# Patient Record
Sex: Female | Born: 1965
Health system: Southern US, Community
[De-identification: ages and names within clinical notes are randomized; demographics above are authoritative.]

## PROBLEM LIST (undated history)

## (undated) DIAGNOSIS — D649 Anemia, unspecified: Secondary | ICD-10-CM

## (undated) DIAGNOSIS — F32A Depression, unspecified: Secondary | ICD-10-CM

## (undated) DIAGNOSIS — J449 Chronic obstructive pulmonary disease, unspecified: Secondary | ICD-10-CM

## (undated) DIAGNOSIS — F329 Major depressive disorder, single episode, unspecified: Secondary | ICD-10-CM

## (undated) DIAGNOSIS — N809 Endometriosis, unspecified: Secondary | ICD-10-CM

## (undated) DIAGNOSIS — K219 Gastro-esophageal reflux disease without esophagitis: Secondary | ICD-10-CM

## (undated) DIAGNOSIS — B3781 Candidal esophagitis: Secondary | ICD-10-CM

## (undated) DIAGNOSIS — J180 Bronchopneumonia, unspecified organism: Secondary | ICD-10-CM

## (undated) DIAGNOSIS — S46811A Strain of other muscles, fascia and tendons at shoulder and upper arm level, right arm, initial encounter: Secondary | ICD-10-CM

## (undated) HISTORY — PX: FRACTURE SURGERY: SHX138

## (undated) HISTORY — DX: Anemia, unspecified: D64.9

## (undated) HISTORY — DX: Candidal esophagitis: B37.81

## (undated) HISTORY — DX: Strain of other muscles, fascia and tendons at shoulder and upper arm level, right arm, initial encounter: S46.811A

---

## 1984-04-07 HISTORY — PX: TUBAL LIGATION: SHX77

## 1989-04-07 HISTORY — PX: ABDOMINAL HYSTERECTOMY: SHX81

## 1998-05-30 ENCOUNTER — Ambulatory Visit (HOSPITAL_COMMUNITY): Admission: RE | Admit: 1998-05-30 | Discharge: 1998-05-30 | Payer: Self-pay | Admitting: *Deleted

## 1998-05-30 ENCOUNTER — Encounter: Payer: Self-pay | Admitting: *Deleted

## 2000-01-01 ENCOUNTER — Inpatient Hospital Stay (HOSPITAL_COMMUNITY): Admission: AD | Admit: 2000-01-01 | Discharge: 2000-01-01 | Payer: Self-pay | Admitting: Obstetrics

## 2006-04-07 HISTORY — PX: FOOT FRACTURE SURGERY: SHX645

## 2011-03-22 ENCOUNTER — Emergency Department (HOSPITAL_COMMUNITY)
Admission: EM | Admit: 2011-03-22 | Discharge: 2011-03-23 | Disposition: A | Discharge status: Home / Self Care | Payer: Self-pay | Attending: Emergency Medicine | Admitting: Emergency Medicine

## 2011-03-22 ENCOUNTER — Encounter: Payer: Self-pay | Admitting: Emergency Medicine

## 2011-03-22 DIAGNOSIS — S0100XA Unspecified open wound of scalp, initial encounter: Secondary | ICD-10-CM | POA: Insufficient documentation

## 2011-03-22 DIAGNOSIS — W1809XA Striking against other object with subsequent fall, initial encounter: Secondary | ICD-10-CM | POA: Insufficient documentation

## 2011-03-22 DIAGNOSIS — S0990XA Unspecified injury of head, initial encounter: Secondary | ICD-10-CM | POA: Insufficient documentation

## 2011-03-22 DIAGNOSIS — F1010 Alcohol abuse, uncomplicated: Secondary | ICD-10-CM

## 2011-03-22 DIAGNOSIS — R51 Headache: Secondary | ICD-10-CM | POA: Insufficient documentation

## 2011-03-22 DIAGNOSIS — W1800XA Striking against unspecified object with subsequent fall, initial encounter: Secondary | ICD-10-CM

## 2011-03-22 NOTE — ED Notes (Signed)
PT. REPORTS SLIPPED AND FELL AT HOME THIS EVENING , + ETOH , NO LOC, HIT HEAD AGAINST TABLE - PRESENTS WITH LACERATION AT RIGHT UPPER BACK SCALP .

## 2011-03-23 ENCOUNTER — Emergency Department (HOSPITAL_COMMUNITY): Payer: Self-pay

## 2011-03-23 MED ORDER — IBUPROFEN 200 MG PO TABS
600.0000 mg | ORAL_TABLET | Freq: Once | ORAL | Status: AC
  Administered 2011-03-23: 600 mg via ORAL
  Filled 2011-03-23: qty 3

## 2011-03-23 NOTE — ED Notes (Signed)
Pt states that she fell and hit her head on a table. Pt experiencing pain head pain only. Denies pain anywhere else. Pt smells of ETOH

## 2011-03-23 NOTE — ED Notes (Signed)
Pt left before receiving discharge instruction

## 2011-03-24 NOTE — ED Provider Notes (Signed)
History     CSN: [id number] Arrival date & time: 03/22/2011 11:07 PM   First MD Initiated Contact with Patient 03/23/11 0015      Chief Complaint  Patient presents with  . Fall    (Consider location/radiation/quality/duration/timing/severity/associated sxs/prior treatment) Patient is a 45 y.o. female presenting with fall. The history is provided by the patient. No language interpreter was used.  Fall The accident occurred 3 to 5 hours ago. The fall occurred while walking. Impact surface: corner of table. The volume of blood lost was moderate. The point of impact was the head. The pain is present in the head. Associated symptoms include headaches. Pertinent negatives include no visual change, no fever, no numbness, no abdominal pain, no nausea, no vomiting, no hearing loss, no loss of consciousness and no tingling. The symptoms are aggravated by ambulation. She has tried nothing for the symptoms.  Reports slipping and fell against the corner of a table at home.  5cm Laceration to R crown of head.  Bleeding contolled.  + etoh.  Hematoma forming inside the wound.  A & O x 3.  Cooperative. H/a presently.  History reviewed. No pertinent past medical history.  Past Surgical History  Procedure Date  . Tubal ligation   . Abdominal hysterectomy     No family history on file.  History  Substance Use Topics  . Smoking status: Not on file  . Smokeless tobacco: Not on file  . Alcohol Use:     OB History    Grav Para Term Preterm Abortions TAB SAB Ect Mult Living                  Review of Systems  Constitutional: Negative for fever.  Gastrointestinal: Negative for nausea, vomiting and abdominal pain.  Neurological: Positive for headaches. Negative for tingling, loss of consciousness and numbness.  All other systems reviewed and are negative.    Allergies  Review of patient's allergies indicates no known allergies.  Home Medications   Current Outpatient Rx  Name Route Sig  Dispense Refill  . ASPIRIN EC 81 MG PO TBEC Oral Take 81 mg by mouth daily.      Marland Kitchen CALCIUM CARBONATE 1250 MG PO TABS Oral Take 1 tablet by mouth daily.      Marland Kitchen CITALOPRAM HYDROBROMIDE 40 MG PO TABS Oral Take 40 mg by mouth daily. May take up to 2 additional doses per day if needed for excessive anxiety      . OMEGA-3 FATTY ACIDS 1000 MG PO CAPS Oral Take 2 g by mouth daily.      Carma Leaven M PLUS PO TABS Oral Take 1 tablet by mouth daily.        BP 119/80  Pulse 77  Temp(Src) 97.5 F (36.4 C) (Oral)  Resp 20  SpO2 93%  Physical Exam  Nursing note and vitals reviewed. Constitutional: She is oriented to person, place, and time.  HENT:  Head: Head is with laceration. Head is without raccoon's eyes, without Battle's sign, without abrasion and without contusion.    Right Ear: Tympanic membrane normal. No decreased hearing is noted.  Left Ear: Tympanic membrane normal. No decreased hearing is noted.  Eyes: Pupils are equal, round, and reactive to light.  Pulmonary/Chest: Effort normal and breath sounds normal. No respiratory distress. She has no wheezes.  Musculoskeletal: Normal range of motion. She exhibits no edema and no tenderness.  Neurological: She is alert and oriented to person, place, and time. No cranial nerve deficit.  ED Course  LACERATION REPAIR Date/Time: 03/23/2011 3:10 AM Performed by: Jethro Bastos Authorized by: Jethro Bastos Consent: Verbal consent obtained. Written consent not obtained. Risks and benefits: risks, benefits and alternatives were discussed Consent given by: patient Patient understanding: patient states understanding of the procedure being performed Patient consent: the patient's understanding of the procedure matches consent given Procedure consent: procedure consent matches procedure scheduled Relevant documents: relevant documents present and verified Site marked: the operative site was marked Imaging studies: imaging studies  available Patient identity confirmed: verbally with patient, arm band, provided demographic data and hospital-assigned identification number Time out: Immediately prior to procedure a "time out" was called to verify the correct patient, procedure, equipment, support staff and site/side marked as required. Body area: head/neck Location details: scalp Laceration length: 5 cm Foreign bodies: wood Tendon involvement: none Nerve involvement: none Vascular damage: no Anesthesia: local infiltration Local anesthetic: lidocaine 2% without epinephrine Patient sedated: no Preparation: Patient was prepped and draped in the usual sterile fashion. Irrigation solution: saline Irrigation method: syringe Amount of cleaning: standard Debridement: none Skin closure: staples Number of sutures: 4 Dressing: antibiotic ointment Patient tolerance: Patient tolerated the procedure well with no immediate complications.   (including critical care time)  Labs Reviewed - No data to display Ct Head Wo Contrast  03/23/2011  *RADIOLOGY REPORT*  Clinical Data: Headache status post fall.  CT HEAD WITHOUT CONTRAST  Technique:  Contiguous axial images were obtained from the base of the skull through the vertex without contrast.  Comparison: None.  Findings: There is no evidence for acute hemorrhage, hydrocephalus, mass lesion, or abnormal extra-axial fluid collection.  No definite CT evidence for acute infarction.  The visualized paranasal sinuses and mastoid air cells are predominately clear.  No displaced calvarial fracture. Skin staples project along the right posterior scalp.  Small associated hematoma.  IMPRESSION: No acute intracranial abnormality.  Tiny right posterolateral scalp hematoma with overlying staples.  Original Report Authenticated By: Waneta Martins, M.D.     1. Fall against object   2. ETOH abuse   3. Minor head injury       MDM  Treated for 5cm laceration sustained after fall.+ etoh.  No  neck pain.  CT head unremarkable.  Left prior to discharge papers.  Staples to crown of head.  Tolerated well.         Jethro Bastos, NP 03/24/11 [phone]

## 2011-03-31 ENCOUNTER — Emergency Department (HOSPITAL_COMMUNITY)
Admission: EM | Admit: 2011-03-31 | Discharge: 2011-03-31 | Disposition: A | Discharge status: Home / Self Care | Payer: Self-pay | Attending: Emergency Medicine | Admitting: Emergency Medicine

## 2011-03-31 ENCOUNTER — Encounter (HOSPITAL_COMMUNITY): Payer: Self-pay | Admitting: *Deleted

## 2011-03-31 DIAGNOSIS — Z4802 Encounter for removal of sutures: Secondary | ICD-10-CM | POA: Insufficient documentation

## 2011-03-31 MED ORDER — IBUPROFEN 800 MG PO TABS
ORAL_TABLET | ORAL | Status: AC
  Administered 2011-03-31: 800 mg
  Filled 2011-03-31: qty 1

## 2011-03-31 NOTE — ED Provider Notes (Signed)
History     CSN: [id number]  Arrival date & time 03/31/11  1207   First MD Initiated Contact with Patient 03/31/11 1238      Chief Complaint  Patient presents with  . Suture / Staple Removal    (Consider location/radiation/quality/duration/timing/severity/associated sxs/prior treatment) HPI Comments: Staple removal from scalp after sustaining a laceration on December 15. Patient complains at pain at the site but no bleeding or drainage. No fevers  Patient is a 45 y.o. female presenting with suture removal. The history is provided by the patient.  Suture / Staple Removal  The sutures were placed 7 to 10 days ago. Treatments since wound repair include antibiotic ointment use.    History reviewed. No pertinent past medical history.  Past Surgical History  Procedure Date  . Tubal ligation   . Abdominal hysterectomy     History reviewed. No pertinent family history.  History  Substance Use Topics  . Smoking status: Not on file  . Smokeless tobacco: Not on file  . Alcohol Use:     OB History    Grav Para Term Preterm Abortions TAB SAB Ect Mult Living                  Review of Systems  All other systems reviewed and are negative.    Allergies  Review of patient's allergies indicates no known allergies.  Home Medications   Current Outpatient Rx  Name Route Sig Dispense Refill  . ASPIRIN EC 81 MG PO TBEC Oral Take 81 mg by mouth daily.      Marland Kitchen CALCIUM CARBONATE 1250 MG PO TABS Oral Take 1 tablet by mouth daily.      Marland Kitchen CITALOPRAM HYDROBROMIDE 40 MG PO TABS Oral Take 40 mg by mouth daily. May take up to 2 additional doses per day if needed for excessive anxiety      . OMEGA-3 FATTY ACIDS 1000 MG PO CAPS Oral Take 2 g by mouth daily.      Carma Leaven M PLUS PO TABS Oral Take 1 tablet by mouth daily.        BP 124/77  Pulse 89  Temp(Src) 98.3 F (36.8 C) (Oral)  SpO2 100%  Physical Exam  Constitutional: She is oriented to person, place, and time. She appears  well-developed and well-nourished. No distress.  HENT:  Head: Normocephalic and atraumatic.       5 staples intact to right parietal area, no bleeding or drainage  Eyes: Pupils are equal, round, and reactive to light.  Pulmonary/Chest: Effort normal. No respiratory distress.  Abdominal: Soft.  Neurological: She is oriented to person, place, and time.  Skin: Skin is warm.    ED Course  Procedures (including critical care time)  Labs Reviewed - No data to display No results found.   1. Staple removal       MDM  Staple removal from right scalp now 54 days old.  Wound is healing well.  Staples removed today, local wound care discussed       Glynn Octave, MD 03/31/11 1301

## 2011-03-31 NOTE — ED Notes (Signed)
Needs staples removed from scalp. Placed 8 days ago

## 2011-04-07 NOTE — ED Provider Notes (Signed)
Medical screening examination/treatment/procedure(s) were performed by non-physician practitioner and as supervising physician I was immediately available for consultation/collaboration.  Olivia Mackie, MD 04/07/11 [phone]

## 2012-04-17 ENCOUNTER — Encounter (HOSPITAL_COMMUNITY): Payer: Self-pay | Admitting: Emergency Medicine

## 2012-04-17 ENCOUNTER — Inpatient Hospital Stay (HOSPITAL_COMMUNITY)
Admission: EM | Admit: 2012-04-17 | Discharge: 2012-04-27 | DRG: 871 | Disposition: A | Discharge status: Home / Self Care | Payer: MEDICAID | Attending: Internal Medicine | Admitting: Internal Medicine

## 2012-04-17 ENCOUNTER — Emergency Department (HOSPITAL_COMMUNITY): Payer: Self-pay

## 2012-04-17 DIAGNOSIS — J9600 Acute respiratory failure, unspecified whether with hypoxia or hypercapnia: Secondary | ICD-10-CM | POA: Diagnosis not present

## 2012-04-17 DIAGNOSIS — R7402 Elevation of levels of lactic acid dehydrogenase (LDH): Secondary | ICD-10-CM | POA: Diagnosis present

## 2012-04-17 DIAGNOSIS — D696 Thrombocytopenia, unspecified: Secondary | ICD-10-CM | POA: Diagnosis not present

## 2012-04-17 DIAGNOSIS — Z7982 Long term (current) use of aspirin: Secondary | ICD-10-CM

## 2012-04-17 DIAGNOSIS — F32A Depression, unspecified: Secondary | ICD-10-CM | POA: Diagnosis present

## 2012-04-17 DIAGNOSIS — E8729 Other acidosis: Secondary | ICD-10-CM | POA: Diagnosis present

## 2012-04-17 DIAGNOSIS — F341 Dysthymic disorder: Secondary | ICD-10-CM

## 2012-04-17 DIAGNOSIS — J80 Acute respiratory distress syndrome: Secondary | ICD-10-CM | POA: Diagnosis not present

## 2012-04-17 DIAGNOSIS — J189 Pneumonia, unspecified organism: Secondary | ICD-10-CM

## 2012-04-17 DIAGNOSIS — R319 Hematuria, unspecified: Secondary | ICD-10-CM | POA: Diagnosis present

## 2012-04-17 DIAGNOSIS — Z23 Encounter for immunization: Secondary | ICD-10-CM

## 2012-04-17 DIAGNOSIS — A419 Sepsis, unspecified organism: Principal | ICD-10-CM | POA: Diagnosis present

## 2012-04-17 DIAGNOSIS — F329 Major depressive disorder, single episode, unspecified: Secondary | ICD-10-CM

## 2012-04-17 DIAGNOSIS — F3289 Other specified depressive episodes: Secondary | ICD-10-CM | POA: Diagnosis present

## 2012-04-17 DIAGNOSIS — Z79899 Other long term (current) drug therapy: Secondary | ICD-10-CM

## 2012-04-17 DIAGNOSIS — J1100 Influenza due to unidentified influenza virus with unspecified type of pneumonia: Secondary | ICD-10-CM | POA: Diagnosis present

## 2012-04-17 DIAGNOSIS — J9801 Acute bronchospasm: Secondary | ICD-10-CM

## 2012-04-17 DIAGNOSIS — J13 Pneumonia due to Streptococcus pneumoniae: Secondary | ICD-10-CM | POA: Diagnosis present

## 2012-04-17 DIAGNOSIS — R509 Fever, unspecified: Secondary | ICD-10-CM

## 2012-04-17 DIAGNOSIS — I498 Other specified cardiac arrhythmias: Secondary | ICD-10-CM | POA: Diagnosis not present

## 2012-04-17 DIAGNOSIS — R197 Diarrhea, unspecified: Secondary | ICD-10-CM

## 2012-04-17 DIAGNOSIS — R404 Transient alteration of awareness: Secondary | ICD-10-CM | POA: Diagnosis not present

## 2012-04-17 DIAGNOSIS — E872 Acidosis, unspecified: Secondary | ICD-10-CM | POA: Diagnosis present

## 2012-04-17 DIAGNOSIS — E876 Hypokalemia: Secondary | ICD-10-CM | POA: Diagnosis present

## 2012-04-17 DIAGNOSIS — R7401 Elevation of levels of liver transaminase levels: Secondary | ICD-10-CM | POA: Diagnosis present

## 2012-04-17 DIAGNOSIS — R0902 Hypoxemia: Secondary | ICD-10-CM | POA: Diagnosis not present

## 2012-04-17 DIAGNOSIS — E871 Hypo-osmolality and hyponatremia: Secondary | ICD-10-CM | POA: Diagnosis present

## 2012-04-17 DIAGNOSIS — Z87891 Personal history of nicotine dependence: Secondary | ICD-10-CM

## 2012-04-17 DIAGNOSIS — J449 Chronic obstructive pulmonary disease, unspecified: Secondary | ICD-10-CM | POA: Diagnosis present

## 2012-04-17 DIAGNOSIS — F411 Generalized anxiety disorder: Secondary | ICD-10-CM | POA: Diagnosis present

## 2012-04-17 DIAGNOSIS — J180 Bronchopneumonia, unspecified organism: Secondary | ICD-10-CM | POA: Diagnosis present

## 2012-04-17 DIAGNOSIS — J4489 Other specified chronic obstructive pulmonary disease: Secondary | ICD-10-CM | POA: Diagnosis present

## 2012-04-17 DIAGNOSIS — E860 Dehydration: Secondary | ICD-10-CM

## 2012-04-17 HISTORY — DX: Bronchopneumonia, unspecified organism: J18.0

## 2012-04-17 HISTORY — DX: Depression, unspecified: F32.A

## 2012-04-17 HISTORY — DX: Chronic obstructive pulmonary disease, unspecified: J44.9

## 2012-04-17 HISTORY — DX: Major depressive disorder, single episode, unspecified: F32.9

## 2012-04-17 LAB — URINALYSIS, ROUTINE W REFLEX MICROSCOPIC
Ketones, ur: 40 mg/dL — AB
Leukocytes, UA: NEGATIVE
Nitrite: NEGATIVE
pH: 6 (ref 5.0–8.0)

## 2012-04-17 LAB — POCT I-STAT 3, ART BLOOD GAS (G3+)
Acid-base deficit: 7 mmol/L — ABNORMAL HIGH (ref 0.0–2.0)
Patient temperature: 99.4
pO2, Arterial: 95 mmHg (ref 80.0–100.0)

## 2012-04-17 LAB — MAGNESIUM: Magnesium: 1.5 mg/dL (ref 1.5–2.5)

## 2012-04-17 LAB — COMPREHENSIVE METABOLIC PANEL
Alkaline Phosphatase: 85 U/L (ref 39–117)
BUN: 12 mg/dL (ref 6–23)
Calcium: 8.6 mg/dL (ref 8.4–10.5)
Creatinine, Ser: 0.56 mg/dL (ref 0.50–1.10)
GFR calc Af Amer: >90 mL/min (ref >90)
Glucose, Bld: 120 mg/dL — ABNORMAL HIGH (ref 70–99)
Potassium: 3.2 mEq/L — ABNORMAL LOW (ref 3.5–5.1)
Total Protein: 6.5 g/dL (ref 6.0–8.3)

## 2012-04-17 LAB — CBC WITH DIFFERENTIAL/PLATELET
Eosinophils Absolute: 0 10*3/uL (ref 0.0–0.7)
Eosinophils Relative: 0 % (ref 0–5)
HCT: 41.2 % (ref 36.0–46.0)
Lymphocytes Relative: 9 % — ABNORMAL LOW (ref 12–46)
Lymphs Abs: 0.4 10*3/uL — ABNORMAL LOW (ref 0.7–4.0)
MCH: 30.8 pg (ref 26.0–34.0)
MCV: 88.2 fL (ref 78.0–100.0)
Monocytes Absolute: 0.1 10*3/uL (ref 0.1–1.0)
Monocytes Relative: 3 % (ref 3–12)
RBC: 4.67 MIL/uL (ref 3.87–5.11)
WBC: 4.3 10*3/uL (ref 4.0–10.5)

## 2012-04-17 LAB — INFLUENZA PANEL BY PCR (TYPE A & B)
H1N1 flu by pcr: NOT DETECTED
Influenza A By PCR: NEGATIVE

## 2012-04-17 LAB — CBC
HCT: 34.5 % — ABNORMAL LOW (ref 36.0–46.0)
Hemoglobin: 11.9 g/dL — ABNORMAL LOW (ref 12.0–15.0)
MCH: 30.7 pg (ref 26.0–34.0)
MCV: 89.1 fL (ref 78.0–100.0)
RBC: 3.87 MIL/uL (ref 3.87–5.11)

## 2012-04-17 LAB — CG4 I-STAT (LACTIC ACID): Lactic Acid, Venous: 0.94 mmol/L (ref 0.5–2.2)

## 2012-04-17 LAB — RAPID URINE DRUG SCREEN, HOSP PERFORMED
Cocaine: NOT DETECTED
Opiates: NOT DETECTED

## 2012-04-17 LAB — URINE MICROSCOPIC-ADD ON

## 2012-04-17 MED ORDER — ONDANSETRON HCL 4 MG/2ML IJ SOLN
4.0000 mg | Freq: Four times a day (QID) | INTRAMUSCULAR | Status: DC | PRN
  Administered 2012-04-17: 4 mg via INTRAVENOUS
  Filled 2012-04-17: qty 2

## 2012-04-17 MED ORDER — ENOXAPARIN SODIUM 40 MG/0.4ML ~~LOC~~ SOLN
40.0000 mg | SUBCUTANEOUS | Status: DC
  Administered 2012-04-17 – 2012-04-22 (×6): 40 mg via SUBCUTANEOUS
  Filled 2012-04-17 (×7): qty 0.4

## 2012-04-17 MED ORDER — LEVOFLOXACIN IN D5W 750 MG/150ML IV SOLN
750.0000 mg | INTRAVENOUS | Status: DC
  Administered 2012-04-18: 750 mg via INTRAVENOUS
  Filled 2012-04-17 (×2): qty 150

## 2012-04-17 MED ORDER — IPRATROPIUM BROMIDE 0.02 % IN SOLN
0.5000 mg | Freq: Once | RESPIRATORY_TRACT | Status: AC
  Administered 2012-04-17: 0.5 mg via RESPIRATORY_TRACT
  Filled 2012-04-17: qty 2.5

## 2012-04-17 MED ORDER — SODIUM CHLORIDE 0.9 % IV SOLN
1000.0000 mL | INTRAVENOUS | Status: DC
  Administered 2012-04-17 – 2012-04-19 (×5): 1000 mL via INTRAVENOUS

## 2012-04-17 MED ORDER — IPRATROPIUM BROMIDE 0.02 % IN SOLN
0.5000 mg | RESPIRATORY_TRACT | Status: AC
  Administered 2012-04-17: 0.5 mg via RESPIRATORY_TRACT
  Filled 2012-04-17: qty 2.5

## 2012-04-17 MED ORDER — SODIUM CHLORIDE 0.9 % IV BOLUS (SEPSIS)
1000.0000 mL | Freq: Once | INTRAVENOUS | Status: AC
  Administered 2012-04-17: 1000 mL via INTRAVENOUS

## 2012-04-17 MED ORDER — LEVOFLOXACIN IN D5W 750 MG/150ML IV SOLN
750.0000 mg | INTRAVENOUS | Status: DC
  Administered 2012-04-17: 750 mg via INTRAVENOUS
  Filled 2012-04-17: qty 150

## 2012-04-17 MED ORDER — ALBUTEROL SULFATE (5 MG/ML) 0.5% IN NEBU
2.5000 mg | INHALATION_SOLUTION | RESPIRATORY_TRACT | Status: AC
  Administered 2012-04-17: 2.5 mg via RESPIRATORY_TRACT
  Filled 2012-04-17: qty 0.5

## 2012-04-17 MED ORDER — POTASSIUM CHLORIDE 10 MEQ/100ML IV SOLN
10.0000 meq | INTRAVENOUS | Status: AC
  Administered 2012-04-17 (×2): 10 meq via INTRAVENOUS
  Filled 2012-04-17: qty 100

## 2012-04-17 MED ORDER — KETOROLAC TROMETHAMINE 30 MG/ML IJ SOLN
30.0000 mg | Freq: Once | INTRAMUSCULAR | Status: AC
  Administered 2012-04-17: 30 mg via INTRAVENOUS
  Filled 2012-04-17: qty 1

## 2012-04-17 MED ORDER — SODIUM CHLORIDE 0.9 % IV SOLN
INTRAVENOUS | Status: DC
  Administered 2012-04-19 – 2012-04-20 (×4): via INTRAVENOUS

## 2012-04-17 MED ORDER — SODIUM CHLORIDE 0.9 % IV SOLN
1000.0000 mL | Freq: Once | INTRAVENOUS | Status: AC
  Administered 2012-04-17: 1000 mL via INTRAVENOUS

## 2012-04-17 MED ORDER — ASPIRIN EC 81 MG PO TBEC
81.0000 mg | DELAYED_RELEASE_TABLET | Freq: Every day | ORAL | Status: DC
  Administered 2012-04-17 – 2012-04-19 (×3): 81 mg via ORAL
  Filled 2012-04-17 (×3): qty 1

## 2012-04-17 MED ORDER — SODIUM CHLORIDE 0.9 % IV BOLUS (SEPSIS): 1000.0000 mL | Freq: Once | INTRAVENOUS | Status: DC

## 2012-04-17 MED ORDER — CITALOPRAM HYDROBROMIDE 40 MG PO TABS
40.0000 mg | ORAL_TABLET | Freq: Every day | ORAL | Status: DC
  Administered 2012-04-17 – 2012-04-19 (×3): 40 mg via ORAL
  Filled 2012-04-17 (×3): qty 1

## 2012-04-17 MED ORDER — METHOCARBAMOL 500 MG PO TABS
1000.0000 mg | ORAL_TABLET | Freq: Once | ORAL | Status: AC
  Administered 2012-04-17: 1000 mg via ORAL
  Filled 2012-04-17: qty 2

## 2012-04-17 MED ORDER — ALBUTEROL (5 MG/ML) CONTINUOUS INHALATION SOLN
15.0000 mg/h | INHALATION_SOLUTION | Freq: Once | RESPIRATORY_TRACT | Status: AC
  Administered 2012-04-17: 15 mg/h via RESPIRATORY_TRACT
  Filled 2012-04-17: qty 20

## 2012-04-17 NOTE — ED Provider Notes (Signed)
History     CSN: [id number]  Arrival date & time 04/17/12  1124   First MD Initiated Contact with Patient 04/17/12 1145      Chief Complaint  Patient presents with  . Shortness of Breath    (Consider location/radiation/quality/duration/timing/severity/associated sxs/prior treatment) HPI  Patient reports she started feeling bad 4 days ago, she has a dry cough and she feels she's been having fever and chills. She states she has diffuse intense myalgias. She denies chest pain specifically. She denies rhinorrhea but tes her throat feels scratchy from coughing. She states she has had diarrhea for the past 2 days and has 3-4 episodes of loose stool a day. She denies nausea or vomiting. She states she feels weak and dizzy and she has been short of breath but denies wheezing. She states everybody in her house has been ill however they are getting better and she is getting worse. She states she did not get the flu shot this year because it made her ill several years ago. She was seen at an urgent care office today and received Rocephin, Decadron and a nebulizer treatment which she states made her shortness of breath better.   PCP [facility] or [facility]   History reviewed. No pertinent past medical history.  Past Surgical History  Procedure Date  . Tubal ligation   . Abdominal hysterectomy     History reviewed. No pertinent family history.  History  Substance Use Topics  . Smoking status: /2-1 ppd  . Smokeless tobacco: Not on file  . Alcohol Use: rare  employed  OB History    Grav Para Term Preterm Abortions TAB SAB Ect Mult Living                  Review of Systems  All other systems reviewed and are negative.    Allergies  Review of patient's allergies indicates no known allergies.  Home Medications   Current Outpatient Rx  Name  Route  Sig  Dispense  Refill  . ACETAMINOPHEN 500 MG PO TABS   Oral   Take 1,000 mg by mouth every 6 (six) hours as needed. For  pain.         Marland Kitchen CITALOPRAM HYDROBROMIDE 40 MG PO TABS   Oral   Take 40 mg by mouth daily. May take up to 2 additional doses per day if needed for excessive anxiety           . DM-GUAIFENESIN ER 30-600 MG PO TB12   Oral   Take 1 tablet by mouth every 12 (twelve) hours. For congestion.         . NYQUIL PO   Oral   Take by mouth.         . ASPIRIN EC 81 MG PO TBEC   Oral   Take 81 mg by mouth daily.           Marland Kitchen CALCIUM CARBONATE 1250 MG PO TABS   Oral   Take 1 tablet by mouth daily.           . OMEGA-3 FATTY ACIDS 1000 MG PO CAPS   Oral   Take 2 g by mouth daily.           Carma Leaven M PLUS PO TABS   Oral   Take 1 tablet by mouth daily.             BP 111/69  Pulse 125  Temp 103 F (39.4 C) (Oral)  Resp  26  SpO2 96%  Vital signs normal tachycardia and fever with tachypnea   Physical Exam  Nursing note and vitals reviewed. Constitutional: She is oriented to person, place, and time. She appears well-developed and well-nourished.  Non-toxic appearance. She does not appear ill. She appears distressed.  HENT:  Head: Normocephalic and atraumatic.  Right Ear: External ear normal.  Left Ear: External ear normal.  Nose: Nose normal. No mucosal edema or rhinorrhea.  Mouth/Throat: Oropharynx is clear and moist and mucous membranes are normal. No dental abscesses or uvula swelling.  Eyes: Conjunctivae normal and EOM are normal. Pupils are equal, round, and reactive to light.  Neck: Normal range of motion and full passive range of motion without pain. Neck supple.  Cardiovascular: Normal rate, regular rhythm and normal heart sounds.  Exam reveals no gallop and no friction rub.   No murmur heard. Pulmonary/Chest: She is in respiratory distress. She has decreased breath sounds. She has no wheezes. She has no rhonchi. She has no rales. She exhibits no tenderness and no crepitus.       Coughing frequently, moaning when she breathes  Abdominal: Soft. Normal  appearance and bowel sounds are normal. She exhibits no distension. There is no tenderness. There is no rebound and no guarding.  Musculoskeletal: Normal range of motion. She exhibits no edema and no tenderness.       Moves all extremities well.   Neurological: She is alert and oriented to person, place, and time. She has normal strength. No cranial nerve deficit.  Skin: Skin is warm, dry and intact. No rash noted. No erythema. No pallor.  Psychiatric: She has a normal mood and affect. Her speech is normal and behavior is normal. Her mood appears not anxious.    ED Course  Procedures (including critical care time)   Medications  0.9 %  sodium chloride infusion (0 mL Intravenous Stopped 04/17/12 1400)    Followed by  0.9 %  sodium chloride infusion (not administered)  levofloxacin (LEVAQUIN) IVPB 750 mg (750 mg Intravenous New Bag/Given 04/17/12 1443)  potassium chloride 10 mEq in 100 mL IVPB (not administered)  ketorolac (TORADOL) 30 MG/ML injection 30 mg (not administered)  methocarbamol (ROBAXIN) tablet 1,000 mg (not administered)  sodium chloride 0.9 % bolus 1,000 mL (not administered)  albuterol (PROVENTIL,VENTOLIN) solution continuous neb (15 mg/hr Nebulization Given 04/17/12 1306)  ipratropium (ATROVENT) nebulizer solution 0.5 mg (0.5 mg Nebulization Given 04/17/12 1306)   14:40 Recheck after continuous nebulizer, has improved air movement, now has some scattered expir wheezing, still coughing a lot.   Patient's dehydration treated with IV fluids. She was given IV potassium for her low potassium. She was given IV Toradol and oral Robaxin for her complaints of muscle aches. Her fever improved during her ED visit. She was given IV Levaquin for her pneumonia of seen on chest x-ray. After discussion with the admitting doctor an influenza test was done, although she has been ill for 4 days and will most likely not respond to Tamiflu.  14:49 Dr Bosie Clos, [facility], will come see patient for  admission   Dg Chest Portable 1 View  04/17/2012  *RADIOLOGY REPORT*  Clinical Data: Shortness of breath.  Cough and fever.  PORTABLE CHEST - 1 VIEW  Comparison: Chest x-ray 09/10/2005.  Findings: Lung volumes are low and there are patchy ill-defined opacities throughout the periphery of the lung bases bilaterally, which could represent areas of early airspace consolidation.  Mild diffuse peribronchial cuffing.  No definite pleural effusions.  No evidence of pulmonary edema.  Heart size is normal.  Mediastinal contours are unremarkable.  IMPRESSION: 1.  Findings, as above, concerning for bronchitis, potentially with developing bronchopneumonia.  Clinical correlation is recommended.   Original Report Authenticated By: Trudie Reed, M.D.      1. Community acquired pneumonia   2. Dehydration   3. Diarrhea   4. Fever   5. Hyponatremia   6. Hypokalemia   7. Bronchospasm    Plan admission    CRITICAL CARE Performed by: Devoria Albe L   Total critical care time: 32 min   Critical care time was exclusive of separately billable procedures and treating other patients.  Critical care was necessary to treat or prevent imminent or life-threatening deterioration.  Critical care was time spent personally by me on the following activities: development of treatment plan with patient and/or surrogate as well as nursing, discussions with consultants, evaluation of patient's response to treatment, examination of patient, obtaining history from patient or surrogate, ordering and performing treatments and interventions, ordering and review of laboratory studies, ordering and review of radiographic studies, pulse oximetry and re-evaluation of patient's condition.   MDM          Ward Givens, MD 04/17/12 1726

## 2012-04-17 NOTE — ED Notes (Signed)
Pt presents to ED via EMS from Doctors office. Pt reports shortness of breath, cough, fever, body aches, chills, sweats, congestion. Headache. While at dr office patient received PIV, rocephin, decadron and breathing treatment.

## 2012-04-17 NOTE — H&P (Signed)
Hospital Admission Note Date: 04/17/2012  Patient name: Patricia Farrell Medical record number: [id number] Date of birth: Mar 03, 1966 Age: 47 y.o. Gender: female PCP: No primary provider on file.  Medical Service: [facility]  Attending physician:  Dr. Eben Burow    1st Contact: Dr. Heloise Beecham    Pager:[phone] 2nd Contact: Dr. Bosie Clos   Pager: [phone] After 5 pm or weekends: 1st Contact:  Intern pager   Pager: [phone] 2nd Contact:  Resident pager  Pager: [phone]  Chief Complaint: Fever, myalgias, cough, SOB  History of Present Illness: Patricia Farrell is a 47 year old female with past medical history of depression presenting with 5 days complaints of fever, chills, body aches and 3-4 days of nonproductive cough and shortness of breath. Patricia Farrell reports that 5 days ago she started noting scratchy throat and diffuse body aches as well as some subjective fever and chills. Her boyfriend at home and they're other roommates were ill with similar symptoms. Over the past 3-4 days, she's developed a persistent, nonproductive cough and shortness of breath. She has also noted 3 days of loose, nonbloody stools. She has had decreased appetite and has not had much to eat or drink by mouth for the past few days. She also endorses bilateral headache over her temples over the past 3 days. She is feeling generally weaker than usual, but has no focal weakness. She has a 30-40 pack year smoking history, but denies any chronic lung disease her home inhaler use. She's not tried any over-the-counter medications. She's never had a similar illness in the past. She has missed several days of work due to this acute illness.   Meds: Current Outpatient Rx  Name  Route  Sig  Dispense  Refill  . ACETAMINOPHEN 500 MG PO TABS   Oral   Take 1,000 mg by mouth every 6 (six) hours as needed. For pain.         Marland Kitchen CITALOPRAM HYDROBROMIDE 40 MG PO TABS   Oral   Take 40 mg by mouth daily. May take up to 2 additional doses per day if  needed for excessive anxiety           . DM-GUAIFENESIN ER 30-600 MG PO TB12   Oral   Take 1 tablet by mouth every 12 (twelve) hours. For congestion.         . NYQUIL PO   Oral   Take by mouth.         . ASPIRIN EC 81 MG PO TBEC   Oral   Take 81 mg by mouth daily.           Marland Kitchen CALCIUM CARBONATE 1250 MG PO TABS   Oral   Take 1 tablet by mouth daily.           . OMEGA-3 FATTY ACIDS 1000 MG PO CAPS   Oral   Take 2 g by mouth daily.           Carma Leaven M PLUS PO TABS   Oral   Take 1 tablet by mouth daily.             Allergies: Allergies as of 04/17/2012  . (No Known Allergies)   Past Medical History  Diagnosis Date  . Depression    Past Surgical History  Procedure Date  . Tubal ligation   . Abdominal hysterectomy    History reviewed. No pertinent family history. History   Social History  . Marital Status: Legally Separated    Spouse Name: N/A  Number of Children: N/A  . Years of Education: N/A   Occupational History  . Not on file.   Social History Main Topics  . Smoking status: Current Every Day Smoker -- 1.0 packs/day for 30 years    Types: Cigarettes  . Smokeless tobacco: Current User  . Alcohol Use: Yes     Comment: Denies current heavy EtOH, says a few drinks per week. Endorses prior heavy use.   . Drug Use: Not on file     Comment: Patient daughter in room and patient distressed. Will discuss drug use at later evaluation.  . Sexually Active: Not on file   Other Topics Concern  . Not on file   Social History Narrative   Works at [employer].     Review of Systems: 10 pt ROS performed, pertinent positives and negatives noted in HPI  Physical Exam: Blood pressure 123/66, pulse 133, temperature 99.4 F (37.4 C), temperature source Axillary, resp. rate 22, SpO2 99.00%. Vitals reviewed. General: Lying in bed, distressed appearing, breathing deeply HEENT: PERRL, EOMI, no scleral icterus Cardiac: Tachycardic with a rate in  130s, regular rhythm, no rubs, murmurs or gallops Pulm: Breathing about 30 times per minute and saturating 98-99% on 4 L nasal cannula. Good air movement in bilateral lung fields, no wheezing, rales, or rhonchi  Abd: soft, nontender, nondistended, BS present. No right upper quadrant tenderness. Ext: warm and well perfused, no pedal edema. Several small bruises noted in left antecubital fossa. Elongated left thumbnail. Neuro: alert and oriented X3, cranial nerves II-XII grossly intact, strength and sensation to light touch equal in bilateral upper and lower extremities MSK: Diffuse tender muscles of bilateral upper and lower extremities and back   Lab results: Basic Metabolic Panel:  Basename 04/17/12 1243  NA 130*  K 3.2*  CL 93*  CO2 21  GLUCOSE 120*  BUN 12  CREATININE 0.56  CALCIUM 8.6  MG --  PHOS --   Liver Function Tests:  Basename 04/17/12 1243  AST 202*  ALT 134*  ALKPHOS 85  BILITOT 0.2*  PROT 6.5  ALBUMIN 2.9*   CBC:  Basename 04/17/12 1243  WBC 4.3  NEUTROABS 3.7  HGB 14.4  HCT 41.2  MCV 88.2  PLT 121*   Urinalysis:  Basename 04/17/12 1421  COLORURINE YELLOW  LABSPEC 1.026  PHURINE 6.0  GLUCOSEU NEGATIVE  HGBUR MODERATE*  BILIRUBINUR NEGATIVE  KETONESUR 40*  PROTEINUR >300*  UROBILINOGEN 0.2  NITRITE NEGATIVE  LEUKOCYTESUR NEGATIVE   Imaging results:  Dg Chest Portable 1 View  04/17/2012  *RADIOLOGY REPORT*  Clinical Data: Shortness of breath.  Cough and fever.  PORTABLE CHEST - 1 VIEW  Comparison: Chest x-ray 09/10/2005.  Findings: Lung volumes are low and there are patchy ill-defined opacities throughout the periphery of the lung bases bilaterally, which could represent areas of early airspace consolidation.  Mild diffuse peribronchial cuffing.  No definite pleural effusions.  No evidence of pulmonary edema.  Heart size is normal.  Mediastinal contours are unremarkable.  IMPRESSION: 1.  Findings, as above, concerning for bronchitis, potentially  with developing bronchopneumonia.  Clinical correlation is recommended.   Original Report Authenticated By: Trudie Reed, M.D.     Other results: EKG: pending at time of eval  Assessment & Plan by Problem: Patricia Farrell is a 47 year old female with past medical history of depression presenting with 5 days complaints of fever, chills, body aches and 3-4 days of nonproductive cough and shortness of breath.  1) Bronchopneumonia with flu-like prodrome  meeting SIRS criteria for sepsis Patient presents with 5 days of fever and myalgia, and several days of cough and shortness of breath. Chest x-ray is concerning for early bronchopneumonia. Her clinical picture certainly concerning for influenza as well, and influenza PCR is pending at this time. She may have a viral pneumonia or atypical bacterial pneumonia in setting of recent viral illness. At any rate, she needs 3/4 SIRS criteria for tachycardia, fever to 103, and respiratory rate. She does not have a leukocytosis but does have a left shift with 88% neutrophilia. Reassuringly, she is maintaining stable oxygen saturations on nasal cannula, has normotensive blood pressures, and has a normal lactic acid. She does not have leukocytosis at this time. Her clinical presentation puts her out of the window to benefit from Tamiflu at this time. -Admit to telemetry -Continue Levaquin for treatment of possible community-acquired pneumonia - Blood cultures pending - Albuterol and ipratropium nebulizers as needed for wheezing -Fluid resuscitation with normal saline - Flu precautions, PCR pending. 72-96 hours after sx starting, no benefit from Tamiflu  2) Hypovolemic Hyponatremia Sodium is 130. Likely result of acute lung disease, may represent extrarenal losses as she has complained of diarrhea and appears hypovolemic. Expect to improve with IV fluid resuscitation. -Normal saline -If persistent hyponatremia after volume resuscitation, will consider ordering urine  electrolytes to better characterize  3) Anion gap metabolic acidosis Patient has anion gap metabolic acidosis with gap of 16. Delta delta ratio is a little greater than 1, probable pure acidosis. Also with ketonuria. Normal lactate, no diagnosis of diabetes. Denies any recent alcohol use or other ingestions. Suspect starvation ketoacidosis in setting of minimal po intake over several days. Anion gap is only modestly elevated this time. -Monitor after adequate fluid resuscitation and po intake. - Will provide diet as pt expressing hunger  4) Transaminitis Patient incidentally noted on labs to have AST 200 ALT 130. She does not have elevation of her alkaline phosphatase or bilirubin. No prior labs for comparison. She does report remote alcohol abuse, and AST to ALT ratio would be consistent with this. No liver enlargement or tenderness appreciated on physical exam. She also has low platelets, 12,1 which may also represent sequelae of heavy alcohol use. Also noted to have some bruising of antecubital fossa and long thumbnail, may represent indicators of substance abuse. Did not discuss w pt as she was in distressed state with family in room at time of interview. Will discuss in private setting with distress improves.  -Will check hepatitis panel -Will check HIV antibody - UDS - Ethanol level  5) Hypokalemia Potassium is 3.2 on admission. No prior for comparison. May be low in setting of acute illness or related to malnutrition. - Replete IV - Check Mg level  6) Depression/anxiety. Stable. - Continue celexa.   7) Hematuria+proteinuria Has moderate Hb and 3-6 RBC/hpf as well as >300 protein. No dysmorphic RBC. No prior UA for comparison. Cr is 0.56, no evidence of acute renal failure.  - Patient may need outpatient work-up of hematuria/proteinuria  Dispo: Disposition is deferred at this time, awaiting improvement of current medical problems. Anticipated discharge in approximately 2-3 day(s).     The patient does have a current PCP (Dr. Quintella Reichert, [facility] therefore will not be requiring OPC follow-up after discharge.   The patient does not have transportation limitations that hinder transportation to clinic appointments.  Signed: Bronson Curb 04/17/2012, 4:11 PM

## 2012-04-17 NOTE — ED Notes (Signed)
Patient aware we need urine.  Patient unable to urinate at this time.

## 2012-04-18 ENCOUNTER — Observation Stay (HOSPITAL_COMMUNITY): Payer: Self-pay

## 2012-04-18 DIAGNOSIS — J189 Pneumonia, unspecified organism: Secondary | ICD-10-CM

## 2012-04-18 LAB — COMPREHENSIVE METABOLIC PANEL
BUN: 8 mg/dL (ref 6–23)
Calcium: 7.8 mg/dL — ABNORMAL LOW (ref 8.4–10.5)
Creatinine, Ser: 0.49 mg/dL — ABNORMAL LOW (ref 0.50–1.10)
GFR calc Af Amer: >90 mL/min (ref >90)
Glucose, Bld: 128 mg/dL — ABNORMAL HIGH (ref 70–99)
Sodium: 139 mEq/L (ref 135–145)
Total Protein: 5.7 g/dL — ABNORMAL LOW (ref 6.0–8.3)

## 2012-04-18 LAB — CBC
HCT: 36.2 % (ref 36.0–46.0)
Hemoglobin: 12.2 g/dL (ref 12.0–15.0)
RBC: 4.03 MIL/uL (ref 3.87–5.11)
WBC: 7.4 10*3/uL (ref 4.0–10.5)

## 2012-04-18 LAB — HEPATITIS PANEL, ACUTE
HCV Ab: NEGATIVE
Hep A IgM: NEGATIVE

## 2012-04-18 LAB — URINE CULTURE

## 2012-04-18 MED ORDER — PREDNISONE 50 MG PO TABS
60.0000 mg | ORAL_TABLET | Freq: Every day | ORAL | Status: DC
  Administered 2012-04-18 – 2012-04-19 (×2): 60 mg via ORAL
  Filled 2012-04-18 (×4): qty 1

## 2012-04-18 MED ORDER — IBUPROFEN 600 MG PO TABS
600.0000 mg | ORAL_TABLET | Freq: Four times a day (QID) | ORAL | Status: DC | PRN
  Administered 2012-04-18: 600 mg via ORAL
  Filled 2012-04-18 (×2): qty 1

## 2012-04-18 MED ORDER — ALBUTEROL SULFATE (5 MG/ML) 0.5% IN NEBU
2.5000 mg | INHALATION_SOLUTION | Freq: Four times a day (QID) | RESPIRATORY_TRACT | Status: DC | PRN
  Administered 2012-04-18 – 2012-04-19 (×3): 2.5 mg via RESPIRATORY_TRACT
  Filled 2012-04-18 (×3): qty 0.5

## 2012-04-18 MED ORDER — ACETAMINOPHEN 325 MG PO TABS
650.0000 mg | ORAL_TABLET | ORAL | Status: DC | PRN
  Administered 2012-04-18: 650 mg via ORAL
  Filled 2012-04-18: qty 2

## 2012-04-18 MED ORDER — NICOTINE 21 MG/24HR TD PT24
21.0000 mg | MEDICATED_PATCH | Freq: Every day | TRANSDERMAL | Status: DC
  Administered 2012-04-18 – 2012-04-19 (×2): 21 mg via TRANSDERMAL
  Filled 2012-04-18 (×3): qty 1

## 2012-04-18 MED ORDER — ZOLPIDEM TARTRATE 5 MG PO TABS
5.0000 mg | ORAL_TABLET | Freq: Every evening | ORAL | Status: DC | PRN
  Administered 2012-04-18: 5 mg via ORAL
  Filled 2012-04-18: qty 1

## 2012-04-18 MED ORDER — IPRATROPIUM BROMIDE 0.02 % IN SOLN
0.5000 mg | RESPIRATORY_TRACT | Status: DC | PRN
  Administered 2012-04-18 – 2012-04-19 (×3): 0.5 mg via RESPIRATORY_TRACT
  Filled 2012-04-18 (×3): qty 2.5

## 2012-04-18 NOTE — Progress Notes (Signed)
Subjective: Patient says breathing about the same from last night, no worse. Feels less anxious. Breathing treatments are helping her. Having some low back pain. Able to ambulate around the room with some SOB but without much difficulty.  Interval repeat chest Xray shows worsening of bilateral PNA> Denies CP,, N/V, dizziness, abdominal pain, diarrhea.  Objective: Vital signs in last 24 hours: Filed Vitals:   04/17/12 2020 04/17/12 2132 04/18/12 0504 04/18/12 0819  BP:  100/65 107/71   Pulse: 99 87 91   Temp:  99.1 F (37.3 C) 99.1 F (37.3 C)   TempSrc:  Oral Oral   Resp: 22 22 20    Height:      Weight:      SpO2: 97% 92% 95% 90%   Weight change:   Intake/Output Summary (Last 24 hours) at 04/18/12 1213 Last data filed at 04/18/12 0600  Gross per 24 hour  Intake 1489.58 ml  Output      0 ml  Net 1489.58 ml  Vitals reviewed.  General: Ambulating around room when I enter, slightly short of breath but improved with rest in bed HEENT: PERRL, EOMI, no scleral icterus  Cardiac: Regular rate in 90s, regular rhythm, no rubs, murmurs or gallops  Pulm: Increased RR after ambulation but improved with rest. Good air movt bilateral lung fields. Soft inspiratory and expiratory wheezes appreciated in upper lung fields which are new compared to yesterday.  Abd: soft, nontender, nondistended, BS present.    Ext: warm and well perfused, no pedal edema.   Neuro: alert and oriented X3, cranial nerves II-XII grossly intact, strength and sensation to light touch equal in bilateral upper and lower extremities    Lab Results: Basic Metabolic Panel:  Lab 04/18/12 1610 04/17/12 1733 04/17/12 1243  NA 139 -- 130*  K 3.8 -- 3.2*  CL 106 -- 93*  CO2 22 -- 21  GLUCOSE 128* -- 120*  BUN 8 -- 12  CREATININE 0.49* 0.60 --  CALCIUM 7.8* -- 8.6  MG -- -- 1.5  PHOS -- -- --   Liver Function Tests:  Lab 04/18/12 0710 04/17/12 1243  AST 129* 202*  ALT 97* 134*  ALKPHOS 76 85  BILITOT 0.1* 0.2*    PROT 5.7* 6.5  ALBUMIN 2.4* 2.9*  CBC:  Lab 04/18/12 0710 04/17/12 1733 04/17/12 1243  WBC 7.4 5.3 --  NEUTROABS -- -- 3.7  HGB 12.2 11.9* --  HCT 36.2 34.5* --  MCV 89.8 89.1 --  PLT 130* 114* --  Urine Drug Screen: Drugs of Abuse     Component Value Date/Time   LABOPIA NONE DETECTED 04/17/2012 1421   COCAINSCRNUR NONE DETECTED 04/17/2012 1421   LABBENZ NONE DETECTED 04/17/2012 1421   AMPHETMU NONE DETECTED 04/17/2012 1421   THCU NONE DETECTED 04/17/2012 1421   LABBARB NONE DETECTED 04/17/2012 1421    Alcohol Level:  Lab 04/17/12 1733  ETH <11   Urinalysis:  Lab 04/17/12 1421  COLORURINE YELLOW  LABSPEC 1.026  PHURINE 6.0  GLUCOSEU NEGATIVE  HGBUR MODERATE*  BILIRUBINUR NEGATIVE  KETONESUR 40*  PROTEINUR >300*  UROBILINOGEN 0.2  NITRITE NEGATIVE  LEUKOCYTESUR NEGATIVE   Studies/Results: Dg Chest 2 View  04/18/2012  *RADIOLOGY REPORT*  Clinical Data: Pneumonia  CHEST - 2 VIEW  Comparison: 04/17/2012  Findings: Worsening ill-defined patchy airspace process throughout the left lung and left lower lobe compatible with multilobar pneumonia.  Stable heart size and vascularity.  No significant effusion.  No pneumothorax.  IMPRESSION: Worsening asymmetric bilateral airspace process  compatible with pneumonia.   Original Report Authenticated By: Judie Petit. Shick, M.D.    Dg Chest Portable 1 View  04/17/2012  *RADIOLOGY REPORT*  Clinical Data: Shortness of breath.  Cough and fever.  PORTABLE CHEST - 1 VIEW  Comparison: Chest x-ray 09/10/2005.  Findings: Lung volumes are low and there are patchy ill-defined opacities throughout the periphery of the lung bases bilaterally, which could represent areas of early airspace consolidation.  Mild diffuse peribronchial cuffing.  No definite pleural effusions.  No evidence of pulmonary edema.  Heart size is normal.  Mediastinal contours are unremarkable.  IMPRESSION: 1.  Findings, as above, concerning for bronchitis, potentially with developing  bronchopneumonia.  Clinical correlation is recommended.   Original Report Authenticated By: Trudie Reed, M.D.    Medications: I have reviewed the patient's current medications. Scheduled Meds:    . aspirin EC  81 mg Oral Daily  . citalopram  40 mg Oral Daily  . enoxaparin (LOVENOX) injection  40 mg Subcutaneous Q24H  . levofloxacin (LEVAQUIN) IV  750 mg Intravenous Q24H  . predniSONE  60 mg Oral Q breakfast  . sodium chloride  1,000 mL Intravenous Once   Continuous Infusions:    . sodium chloride 1,000 mL (04/18/12 0842)  . sodium chloride     PRN Meds:.albuterol, ipratropium, ondansetron (ZOFRAN) IV, zolpidem Assessment/Plan: Ms. Whitehair is a 47 year old female with past medical history of depression presenting with 5 days complaints of fever, chills, body aches and 3-4 days of nonproductive cough and shortness of breath.   1) Bronchopneumonia with flu-like prodrome meeting SIRS criteria for sepsis  Patient's systemic sx improving overnight; no longer tachycardic, has normal RR and no longer febrile. WBC normal on admission and remains so this am. CXR does show interval worsening of bilateral PNA. Clinically she appears to feel much better. She did have new wheezing on exam this morning, which may reflect COPD in setting of chronic smoking history. Flu PCR negative, outside 48hr window for Tamiflu. - Continue levaquin for CAP - PRN nebs, prednisone burst - pulse oximetry and [place] to maintain oxygen saturation  2) Hypovolemic Hyponatremia  Resolved. Sodium was 130, recovered to 139 overnight w NS IV.   3) Anion gap metabolic acidosis  Resolved. Patient had anion gap metabolic acidosis with gap of 16, resolved overnight w IVF and diet, is 11 this morning.  4) Transaminitis  Patient incidentally noted on labs to have AST 200 ALT 130. She does not have elevation of her alkaline phosphatase or bilirubin. No prior labs for comparison. She does report remote alcohol abuse, and AST to  ALT ratio would be consistent with this. No liver enlargement or tenderness appreciated on physical exam. She also has low platelets, 121 which may also represent sequelae of heavy alcohol use.  -Hepatitis panel pending  -HIV antibody pending - UDS negative - Ethanol level negative  5) Hypokalemia  Potassium is 3.2 on admission. No prior for comparison. Magnesium 1.5. May be low in setting of acute illness or related to malnutrition. Recovered to 3.8 this morning - Replete prn - follow daily bmet  6) Depression/anxiety.  Stable.  - Continue celexa.   7) Hematuria+proteinuria  Has moderate Hb and 3-6 RBC/hpf as well as >300 protein. No dysmorphic RBC. No prior UA for comparison. Cr is 0.49, no evidence of acute renal failure.  - Patient may need outpatient work-up of hematuria/proteinuria   Dispo: Disposition is deferred at this time, awaiting improvement of current medical problems.  Anticipated discharge in  approximately 1-2 day(s).   The patient does have a current PCP (Dr. Quintella Reichert.), therefore will not be requiring OPC follow-up after discharge.   The patient does not have transportation limitations that hinder transportation to clinic appointments.  .Services Needed at time of discharge: Y = Yes, Blank = No PT:   OT:   RN:   Equipment:   Other:     LOS: 1 day   Bronson Curb 04/18/2012, 12:13 PM

## 2012-04-18 NOTE — H&P (Signed)
Internal Medicine Attending Admission Note Date: 04/18/2012  Patient name: Patricia Farrell Medical record number: [id number] Date of birth: Aug 07, 1965 Age: 47 y.o. Gender: female  I saw and evaluated the patient. I reviewed the resident's note and I agree with the resident's findings and plan as documented in the resident's note.  Chief Complaint(s): Fever, cough and progressive shortness of breath  History - key components related to admission: Patient is a 47 year old female with past medical history most significant for depression who presented with chief complaints of fever, chills, body aches and progressive shortness of breath which started 5 days prior to admission. He should said that her boyfriend and his roommates were all suffering from cold symptoms last week. She was apparently well until she started having persistent cough which was associated with shortness of breath about 5 days ago. Patient does not report any sputum production. The cough and shortness of breath got progressively worse over last 5 days and was associated with fever and chills in the last 3 days. Patient has decreased appetite, diarrhea , generalized weakness, decreased oral intake, headaches and back pain. Patient denies any chest pain, similar illness in the past.  Patient has 40-pack-year smoking history but quit last week due to the illness. She mentions that she plans to stop smoking.  Review of system is as per history of present illness.  Past medical history, past surgical history, social history, family history, medications and allergies as per resident's note.    Physical Exam - key components related to admission:  Filed Vitals:   04/17/12 2020 04/17/12 2132 04/18/12 0504 04/18/12 0819  BP:  100/65 107/71   Pulse: 99 87 91   Temp:  99.1 F (37.3 C) 99.1 F (37.3 C)   TempSrc:  Oral Oral   Resp: 22 22 20    Height:      Weight:      SpO2: 97% 92% 95% 90%  Physical Exam: General: Vital signs  reviewed and noted. Well-developed, well-nourished, in mild respiratory distress; alert, appropriate and cooperative throughout examination.  Head: Normocephalic, atraumatic.  Eyes: PERRL, EOMI, No signs of anemia or jaundince.  Nose: Mucous membranes moist, not inflammed, nonerythematous.  Throat: Oropharynx nonerythematous, no exudate appreciated.   Neck: No deformities, masses, or tenderness noted.Supple, No carotid Bruits, no JVD.  Lungs:   patient appears to be in mild distress as she just completed a sponge bath, patient has diffuse expiratory wheezes which are most prominent in bilateral lower lungs, no crackles or ronchi noted.  Heart: RRR. S1 and S2 normal without gallop, murmur, or rubs.  Abdomen:  BS normoactive. Soft, Nondistended, non-tender.  No masses or organomegaly.  Extremities: No pretibial edema.  Neurologic: A&O X3, CN II - XII are grossly intact. Motor strength is 5/5 in the all 4 extremities, Sensations intact to light touch, Cerebellar signs negative.  Skin: No visible rashes, scars.     Lab results:   Basic Metabolic Panel:  Basename 04/18/12 0710 04/17/12 1733 04/17/12 1243  NA 139 -- 130*  K 3.8 -- 3.2*  CL 106 -- 93*  CO2 22 -- 21  GLUCOSE 128* -- 120*  BUN 8 -- 12  CREATININE 0.49* 0.60 --  CALCIUM 7.8* -- 8.6  MG -- -- 1.5  PHOS -- -- --   Liver Function Tests:  [facility] 04/18/12 0710 04/17/12 1243  AST 129* 202*  ALT 97* 134*  ALKPHOS 76 85  BILITOT 0.1* 0.2*  PROT 5.7* 6.5  ALBUMIN 2.4* 2.9*  CBC:  Basename 04/18/12 0710 04/17/12 1733 04/17/12 1243  WBC 7.4 5.3 --  NEUTROABS -- -- 3.7  HGB 12.2 11.9* --  HCT 36.2 34.5* --  MCV 89.8 89.1 --  PLT 130* 114* --    Basename 04/17/12 1733  ETH <11   Imaging results:  Dg Chest 2 View  04/18/2012  *RADIOLOGY REPORT*  Clinical Data: Pneumonia  CHEST - 2 VIEW  Comparison: 04/17/2012  Findings: Worsening ill-defined patchy airspace process throughout the left lung and left lower lobe  compatible with multilobar pneumonia.  Stable heart size and vascularity.  No significant effusion.  No pneumothorax.  IMPRESSION: Worsening asymmetric bilateral airspace process compatible with pneumonia.   Original Report Authenticated By: Judie Petit. Shick, M.D.    Dg Chest Portable 1 View  04/17/2012  *RADIOLOGY REPORT*  Clinical Data: Shortness of breath.  Cough and fever.  PORTABLE CHEST - 1 VIEW  Comparison: Chest x-ray 09/10/2005.  Findings: Lung volumes are low and there are patchy ill-defined opacities throughout the periphery of the lung bases bilaterally, which could represent areas of early airspace consolidation.  Mild diffuse peribronchial cuffing.  No definite pleural effusions.  No evidence of pulmonary edema.  Heart size is normal.  Mediastinal contours are unremarkable.  IMPRESSION: 1.  Findings, as above, concerning for bronchitis, potentially with developing bronchopneumonia.  Clinical correlation is recommended.   Original Report Authenticated By: Trudie Reed, M.D.     Other results: EKG: Normal sinus rhythm, normal axis, no ST or T wave changes noted  Assessment & Plan by Problem:  Principal Problem:  *Acute bronchopneumonia Active Problems:  Hyponatremia  Transaminitis  Hypokalemia  Depression  Increased anion gap metabolic acidosis  Hematuria of undiagnosed cause  Patient is a 47 year old 80 female with past medical history of chronic smoking who comes in with signs and symptoms consistent with acute respiratory distress secondary to vital prodrome followed by a bacterial superinfection. 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 -Patient is currently being treated with antibiotics to cover atypicals and typical organisms for community-acquired pneumonia. -Patient is also getting nebulizations -Given that patient has wheezing on exam, I would probably add steroids to her medication regimen today -Flu PCR is pending at this time  and there is no benefit of Tamiflu -Patient is currently being resuscitated with IV fluids -Patient's anion gap is closed at this time -Transaminitis is being followed with hepatitis panel, HIV and will be followed as the results come back. -Electrolyte abnormalities that is hypokalemia, hyponatremia and anion gap have currently resolved    Rest of the medical management as per resident's note.  Lars Mage MD Faculty-Internal Medicine Residency Program

## 2012-04-18 NOTE — Care Management (Signed)
UR completed 

## 2012-04-19 ENCOUNTER — Encounter (HOSPITAL_COMMUNITY): Payer: Self-pay | Admitting: Pulmonary Disease

## 2012-04-19 ENCOUNTER — Inpatient Hospital Stay (HOSPITAL_COMMUNITY): Payer: MEDICAID

## 2012-04-19 DIAGNOSIS — J9600 Acute respiratory failure, unspecified whether with hypoxia or hypercapnia: Secondary | ICD-10-CM | POA: Diagnosis not present

## 2012-04-19 DIAGNOSIS — E876 Hypokalemia: Secondary | ICD-10-CM

## 2012-04-19 DIAGNOSIS — J449 Chronic obstructive pulmonary disease, unspecified: Secondary | ICD-10-CM | POA: Diagnosis present

## 2012-04-19 DIAGNOSIS — R0902 Hypoxemia: Secondary | ICD-10-CM | POA: Diagnosis not present

## 2012-04-19 DIAGNOSIS — J9801 Acute bronchospasm: Secondary | ICD-10-CM

## 2012-04-19 HISTORY — DX: Chronic obstructive pulmonary disease, unspecified: J44.9

## 2012-04-19 LAB — BLOOD GAS, ARTERIAL
Acid-Base Excess: 0.1 mmol/L (ref 0.0–2.0)
Acid-base deficit: 0.5 mmol/L (ref 0.0–2.0)
Bicarbonate: 24.2 mEq/L — ABNORMAL HIGH (ref 20.0–24.0)
Bicarbonate: 24.9 mEq/L — ABNORMAL HIGH (ref 20.0–24.0)
Bicarbonate: 25.6 mEq/L — ABNORMAL HIGH (ref 20.0–24.0)
Drawn by: 27022
Expiratory PAP: 8
FIO2: 0.21 %
FIO2: 1 %
FIO2: 100 %
Mode: POSITIVE
O2 Saturation: 71.4 %
O2 Saturation: 99.1 %
PEEP: 5 cmH2O
Patient temperature: 98.6
Patient temperature: 99.1
RATE: 30 resp/min
TCO2: 25.9 mmol/L (ref 0–100)
TCO2: 27.4 mmol/L (ref 0–100)
TCO2: 25.7 mmol/L (ref 0–100)
pCO2 arterial: 33.3 mmHg — ABNORMAL LOW (ref 35.0–45.0)
pCO2 arterial: 35.4 mmHg (ref 35.0–45.0)
pCO2 arterial: 57.9 mmHg (ref 35.0–45.0)
pH, Arterial: 7.46 — ABNORMAL HIGH (ref 7.350–7.450)
pH, Arterial: 7.268 — ABNORMAL LOW (ref 7.350–7.450)
pH, Arterial: 7.384 (ref 7.350–7.450)
pO2, Arterial: 33.9 mmHg — CL (ref 80.0–100.0)

## 2012-04-19 LAB — MRSA PCR SCREENING: MRSA by PCR: NEGATIVE

## 2012-04-19 LAB — CBC WITH DIFFERENTIAL/PLATELET
Eosinophils Relative: 0 % (ref 0–5)
HCT: 37.5 % (ref 36.0–46.0)
Lymphocytes Relative: 18 % (ref 12–46)
Lymphs Abs: 1.6 10*3/uL (ref 0.7–4.0)
MCV: 89.7 fL (ref 78.0–100.0)
Monocytes Relative: 4 % (ref 3–12)
Neutro Abs: 6.8 10*3/uL (ref 1.7–7.7)
Platelets: 162 10*3/uL (ref 150–400)
RBC: 4.18 MIL/uL (ref 3.87–5.11)
WBC: 8.9 10*3/uL (ref 4.0–10.5)

## 2012-04-19 LAB — BASIC METABOLIC PANEL
CO2: 25 mEq/L (ref 19–32)
Chloride: 101 mEq/L (ref 96–112)
Creatinine, Ser: 0.5 mg/dL (ref 0.50–1.10)
Potassium: 3.6 mEq/L (ref 3.5–5.1)

## 2012-04-19 MED ORDER — ROCURONIUM BROMIDE 50 MG/5ML IV SOLN
50.0000 mg | Freq: Once | INTRAVENOUS | Status: AC
  Administered 2012-04-19: 50 mg via INTRAVENOUS

## 2012-04-19 MED ORDER — BIOTENE DRY MOUTH MT LIQD
15.0000 mL | Freq: Four times a day (QID) | OROMUCOSAL | Status: DC
  Administered 2012-04-20 – 2012-04-23 (×16): 15 mL via OROMUCOSAL

## 2012-04-19 MED ORDER — SODIUM CHLORIDE 0.9 % IV BOLUS (SEPSIS)
500.0000 mL | Freq: Once | INTRAVENOUS | Status: AC
  Administered 2012-04-19: 500 mL via INTRAVENOUS

## 2012-04-19 MED ORDER — SODIUM CHLORIDE 0.9 % IV SOLN
25.0000 ug/h | INTRAVENOUS | Status: DC
  Administered 2012-04-19: 25 ug/h via INTRAVENOUS
  Filled 2012-04-19: qty 50

## 2012-04-19 MED ORDER — FENTANYL CITRATE 0.05 MG/ML IJ SOLN
50.0000 ug | INTRAMUSCULAR | Status: DC | PRN
  Administered 2012-04-19: 100 ug via INTRAVENOUS
  Filled 2012-04-19: qty 2

## 2012-04-19 MED ORDER — LEVALBUTEROL HCL 1.25 MG/0.5ML IN NEBU
1.2500 mg | INHALATION_SOLUTION | RESPIRATORY_TRACT | Status: DC
  Administered 2012-04-19 (×2): 1.25 mg via RESPIRATORY_TRACT
  Administered 2012-04-19: 2.5 mg via RESPIRATORY_TRACT
  Administered 2012-04-19 – 2012-04-25 (×32): 1.25 mg via RESPIRATORY_TRACT
  Filled 2012-04-19 (×43): qty 0.5

## 2012-04-19 MED ORDER — LEVALBUTEROL HCL 1.25 MG/0.5ML IN NEBU
1.2500 mg | INHALATION_SOLUTION | Freq: Four times a day (QID) | RESPIRATORY_TRACT | Status: DC | PRN
  Administered 2012-04-25: 1.25 mg via RESPIRATORY_TRACT
  Filled 2012-04-19 (×2): qty 0.5

## 2012-04-19 MED ORDER — ACETAMINOPHEN 650 MG RE SUPP: 650.0000 mg | Freq: Four times a day (QID) | RECTAL | Status: DC | PRN

## 2012-04-19 MED ORDER — CHLORHEXIDINE GLUCONATE 0.12 % MT SOLN
15.0000 mL | Freq: Two times a day (BID) | OROMUCOSAL | Status: DC
  Administered 2012-04-19 – 2012-04-23 (×8): 15 mL via OROMUCOSAL
  Filled 2012-04-19 (×8): qty 15

## 2012-04-19 MED ORDER — SODIUM CHLORIDE 0.9 % IV BOLUS (SEPSIS)
1000.0000 mL | Freq: Once | INTRAVENOUS | Status: AC
  Administered 2012-04-19: 1000 mL via INTRAVENOUS

## 2012-04-19 MED ORDER — IPRATROPIUM BROMIDE 0.02 % IN SOLN
0.5000 mg | Freq: Four times a day (QID) | RESPIRATORY_TRACT | Status: DC | PRN
  Filled 2012-04-19 (×2): qty 2.5

## 2012-04-19 MED ORDER — ETOMIDATE 2 MG/ML IV SOLN
20.0000 mg | Freq: Once | INTRAVENOUS | Status: AC
  Administered 2012-04-19: 20 mg via INTRAVENOUS

## 2012-04-19 MED ORDER — DEXTROSE 5 % IV SOLN
1.0000 g | INTRAVENOUS | Status: DC
  Administered 2012-04-19: 1 g via INTRAVENOUS
  Filled 2012-04-19: qty 10

## 2012-04-19 MED ORDER — FENTANYL CITRATE 0.05 MG/ML IJ SOLN
INTRAMUSCULAR | Status: AC
  Filled 2012-04-19: qty 4

## 2012-04-19 MED ORDER — OSELTAMIVIR PHOSPHATE 75 MG PO CAPS
75.0000 mg | ORAL_CAPSULE | Freq: Two times a day (BID) | ORAL | Status: DC
  Administered 2012-04-19 (×2): 75 mg via ORAL
  Filled 2012-04-19 (×4): qty 1

## 2012-04-19 MED ORDER — FENTANYL BOLUS VIA INFUSION
25.0000 ug | Freq: Four times a day (QID) | INTRAVENOUS | Status: DC | PRN
  Filled 2012-04-19: qty 100

## 2012-04-19 MED ORDER — CHLORHEXIDINE GLUCONATE 0.12 % MT SOLN
15.0000 mL | Freq: Two times a day (BID) | OROMUCOSAL | Status: DC
  Administered 2012-04-19: 15 mL via OROMUCOSAL
  Filled 2012-04-19: qty 15

## 2012-04-19 MED ORDER — LORAZEPAM 1 MG PO TABS
1.0000 mg | ORAL_TABLET | Freq: Once | ORAL | Status: AC
  Administered 2012-04-19: 1 mg via ORAL
  Filled 2012-04-19: qty 1

## 2012-04-19 MED ORDER — MIDAZOLAM HCL 2 MG/2ML IJ SOLN
2.0000 mg | INTRAMUSCULAR | Status: DC | PRN
  Administered 2012-04-19 – 2012-04-20 (×2): 2 mg via INTRAVENOUS
  Filled 2012-04-19 (×2): qty 2

## 2012-04-19 MED ORDER — MIDAZOLAM HCL 2 MG/2ML IJ SOLN
INTRAMUSCULAR | Status: AC
  Filled 2012-04-19: qty 2

## 2012-04-19 MED ORDER — DEXTROSE 5 % IV SOLN
1.0000 g | Freq: Two times a day (BID) | INTRAVENOUS | Status: DC
  Administered 2012-04-19 – 2012-04-22 (×8): 1 g via INTRAVENOUS
  Filled 2012-04-19 (×10): qty 1

## 2012-04-19 MED ORDER — ONDANSETRON HCL 4 MG/2ML IJ SOLN
4.0000 mg | Freq: Four times a day (QID) | INTRAMUSCULAR | Status: DC | PRN
  Administered 2012-04-22 – 2012-04-25 (×3): 4 mg via INTRAVENOUS
  Filled 2012-04-19 (×4): qty 2

## 2012-04-19 MED ORDER — ACETAMINOPHEN 325 MG PO TABS
650.0000 mg | ORAL_TABLET | ORAL | Status: DC | PRN
  Administered 2012-04-25: 650 mg
  Filled 2012-04-19: qty 2

## 2012-04-19 MED ORDER — METHYLPREDNISOLONE SODIUM SUCC 125 MG IJ SOLR
125.0000 mg | INTRAMUSCULAR | Status: DC
  Administered 2012-04-19: 125 mg via INTRAVENOUS
  Filled 2012-04-19 (×2): qty 2

## 2012-04-19 MED ORDER — MIDAZOLAM HCL 2 MG/2ML IJ SOLN
INTRAMUSCULAR | Status: AC
  Filled 2012-04-19: qty 4

## 2012-04-19 MED ORDER — BIOTENE DRY MOUTH MT LIQD
15.0000 mL | Freq: Two times a day (BID) | OROMUCOSAL | Status: DC
  Administered 2012-04-19: 15 mL via OROMUCOSAL

## 2012-04-19 MED ORDER — IPRATROPIUM BROMIDE 0.02 % IN SOLN
0.5000 mg | RESPIRATORY_TRACT | Status: DC
  Administered 2012-04-19 – 2012-04-25 (×35): 0.5 mg via RESPIRATORY_TRACT
  Filled 2012-04-19 (×34): qty 2.5

## 2012-04-19 MED ORDER — MIDAZOLAM HCL 2 MG/2ML IJ SOLN
6.0000 mg | Freq: Once | INTRAMUSCULAR | Status: AC
  Administered 2012-04-19: 6 mg via INTRAVENOUS

## 2012-04-19 MED ORDER — FENTANYL CITRATE 0.05 MG/ML IJ SOLN
300.0000 ug | Freq: Once | INTRAMUSCULAR | Status: AC
  Administered 2012-04-19: 300 ug via INTRAVENOUS

## 2012-04-19 MED ORDER — DEXTROSE 5 % IV SOLN
500.0000 mg | INTRAVENOUS | Status: DC
  Administered 2012-04-19 – 2012-04-20 (×2): 500 mg via INTRAVENOUS
  Filled 2012-04-19 (×2): qty 500

## 2012-04-19 MED ORDER — FENTANYL CITRATE 0.05 MG/ML IJ SOLN
INTRAMUSCULAR | Status: AC
  Filled 2012-04-19: qty 2

## 2012-04-19 MED ORDER — METHYLPREDNISOLONE SODIUM SUCC 125 MG IJ SOLR
60.0000 mg | Freq: Three times a day (TID) | INTRAMUSCULAR | Status: DC
  Administered 2012-04-19 – 2012-04-20 (×2): 60 mg via INTRAVENOUS
  Filled 2012-04-19 (×6): qty 0.96

## 2012-04-19 NOTE — Progress Notes (Signed)
Received a call from radiology; ETT needs to be retracted 2-3 more cm. Notified respiratory therapist. Will continue to monitor.

## 2012-04-19 NOTE — Progress Notes (Signed)
Subjective: Overnight patient with increased WOB and desaturations to 70s. She required all q4hr prn breathing treatments. Now on 50% facemask to maintain sats of 88%. T 100.6 this am.  Yesterday urinary strep ag returned positive.   Objective: Vital signs in last 24 hours: Filed Vitals:   04/18/12 2203 04/19/12 0141 04/19/12 0453 04/19/12 0700  BP: 117/78  143/97 145/93  Pulse: 101  111 138  Temp: 98.3 F (36.8 C)  98.7 F (37.1 C) 100.6 F (38.1 C)  TempSrc: Oral  Oral Axillary  Resp: 20  22 32  Height:      Weight:      SpO2: 90% 85% 91% 88%   Weight change:   Intake/Output Summary (Last 24 hours) at 04/19/12 0832 Last data filed at 04/18/12 1845  Gross per 24 hour  Intake 2013.75 ml  Output      0 ml  Net 2013.75 ml  Vitals reviewed.  General: Lying in bed, distressed, breathing rapidly Cardiac: Tachycardic 120s-130s, regular rhythm, no rubs, murmurs or gallops  Pulm: Increased work of breathing, RR in 30s, on facemask. Decreased breath sounds in R lower lung fields. No wheezing appreciated.  Ext: warm and well perfused, no pedal edema.      Lab Results: Basic Metabolic Panel:  Lab 04/19/12 1610 04/18/12 0710 04/17/12 1243  NA 135 139 --  K 3.6 3.8 --  CL 101 106 --  CO2 25 22 --  GLUCOSE 144* 128* --  BUN 6 8 --  CREATININE 0.50 0.49* --  CALCIUM 8.1* 7.8* --  MG -- -- 1.5  PHOS -- -- --   Liver Function Tests:  Lab 04/18/12 0710 04/17/12 1243  AST 129* 202*  ALT 97* 134*  ALKPHOS 76 85  BILITOT 0.1* 0.2*  PROT 5.7* 6.5  ALBUMIN 2.4* 2.9*  CBC:  Lab 04/19/12 0630 04/18/12 0710 04/17/12 1243  WBC 8.9 7.4 --  NEUTROABS 6.8 -- 3.7  HGB 12.9 12.2 --  HCT 37.5 36.2 --  MCV 89.7 89.8 --  PLT 162 130* --  Urine Drug Screen: Drugs of Abuse     Component Value Date/Time   LABOPIA NONE DETECTED 04/17/2012 1421   COCAINSCRNUR NONE DETECTED 04/17/2012 1421   LABBENZ NONE DETECTED 04/17/2012 1421   AMPHETMU NONE DETECTED 04/17/2012 1421   THCU NONE  DETECTED 04/17/2012 1421   LABBARB NONE DETECTED 04/17/2012 1421    Alcohol Level:  Lab 04/17/12 1733  ETH <11   Urinalysis:  Lab 04/17/12 1421  COLORURINE YELLOW  LABSPEC 1.026  PHURINE 6.0  GLUCOSEU NEGATIVE  HGBUR MODERATE*  BILIRUBINUR NEGATIVE  KETONESUR 40*  PROTEINUR >300*  UROBILINOGEN 0.2  NITRITE NEGATIVE  LEUKOCYTESUR NEGATIVE   Studies/Results: Dg Chest 2 View  04/18/2012  *RADIOLOGY REPORT*  Clinical Data: Pneumonia  CHEST - 2 VIEW  Comparison: 04/17/2012  Findings: Worsening ill-defined patchy airspace process throughout the left lung and left lower lobe compatible with multilobar pneumonia.  Stable heart size and vascularity.  No significant effusion.  No pneumothorax.  IMPRESSION: Worsening asymmetric bilateral airspace process compatible with pneumonia.   Original Report Authenticated By: Judie Petit. Shick, M.D.    Dg Chest Portable 1 View  04/17/2012  *RADIOLOGY REPORT*  Clinical Data: Shortness of breath.  Cough and fever.  PORTABLE CHEST - 1 VIEW  Comparison: Chest x-ray 09/10/2005.  Findings: Lung volumes are low and there are patchy ill-defined opacities throughout the periphery of the lung bases bilaterally, which could represent areas of early airspace consolidation.  Mild  diffuse peribronchial cuffing.  No definite pleural effusions.  No evidence of pulmonary edema.  Heart size is normal.  Mediastinal contours are unremarkable.  IMPRESSION: 1.  Findings, as above, concerning for bronchitis, potentially with developing bronchopneumonia.  Clinical correlation is recommended.   Original Report Authenticated By: Trudie Reed, M.D.    Medications: I have reviewed the patient's current medications. Scheduled Meds:    . aspirin EC  81 mg Oral Daily  . azithromycin (ZITHROMAX) 500 MG IVPB  500 mg Intravenous Q24H  . cefTRIAXone (ROCEPHIN)  IV  1 g Intravenous Q24H  . citalopram  40 mg Oral Daily  . enoxaparin (LOVENOX) injection  40 mg Subcutaneous Q24H  . levalbuterol   1.25 mg Nebulization Q4H   And  . ipratropium  0.5 mg Nebulization Q4H  . methylPREDNISolone (SOLU-MEDROL) injection  125 mg Intravenous Q24H  . nicotine  21 mg Transdermal Daily  . sodium chloride  1,000 mL Intravenous Once   Continuous Infusions:    . sodium chloride 1,000 mL (04/19/12 0209)  . sodium chloride     PRN Meds:.acetaminophen, ibuprofen, ipratropium, levalbuterol, ondansetron (ZOFRAN) IV, zolpidem Assessment/Plan: Patricia Farrell is a 47 year old female with past medical history of depression presenting with 5 days complaints of fever, chills, body aches and 3-4 days of nonproductive cough and shortness of breath.   1) Bilateral PNA and suspected flu Patient worsened respiratory status overnight. CXR yesterday showed increasing bilateral infiltrates compared to admission. This morning, urinary strep pneumon Ag returned positive. She spiked T 100.6 this am. Remains without leukocytosis. Normotensive BPs. Flu PCR negative, outside 48hr window for Tamiflu. - Change levaquin to ceftriaxone/azithro - scheduled nebs, change from albuterol-->xopenex as pt very tachy during treatments (140s). Continue ipratropium - Change prednisone to solumedrol as pt not likely to tolerate po - transfer to stepdown. - STAT repeat CXR  2) Hypovolemic Hyponatremia  Resolved. Sodium was 130 on admission, 135 today.  3) Anion gap metabolic acidosis  Resolved. Patient had anion gap metabolic acidosis with gap of 16, resolved first night of admission, is 9 this am.    4) Transaminitis  Patient incidentally noted on labs to have AST 200 ALT 130. She does not have elevation of her alkaline phosphatase or bilirubin. No prior labs for comparison. She does report remote alcohol abuse, and AST to ALT ratio would be consistent with this. No liver enlargement or tenderness appreciated on physical exam. She also has low platelets,  which may also represent sequelae of heavy alcohol use.  Transaminases have been  downtrending daily. Hepatitis and HIV panels unrevealing. - continue to monitor, may need oupt followup  5) Hypokalemia  Potassium is 3.2 on admission. No prior for comparison. Magnesium 1.5. May be low in setting of acute illness or related to malnutrition. Stable at 3.6 this am. - Replete prn - follow daily bmet  6) Depression/anxiety.  Stable.  - Continue celexa.   7) Hematuria+proteinuria  Has moderate Hb and 3-6 RBC/hpf as well as >300 protein. No dysmorphic RBC. No prior UA for comparison. Cr is 0.50, no evidence of acute renal failure.  - Patient may need outpatient work-up of hematuria/proteinuria   Dispo: Disposition is deferred at this time, awaiting improvement of current medical problems.  Anticipated discharge in approximately 2-3 day(s).   The patient does have a current PCP (Dr. Quintella Reichert.), therefore will not be requiring OPC follow-up after discharge.   The patient does not have transportation limitations that hinder transportation to clinic appointments.  .Services Needed  at time of discharge: Y = Yes, Blank = No PT:   OT:   RN:   Equipment:   Other:     LOS: 2 days   Bronson Curb 04/19/2012, 8:32 AM

## 2012-04-19 NOTE — Progress Notes (Signed)
Patient in acute respiratory distress,  o2 sat 70s on [place] increased to NRB o2 sats 95%  RR 30s, BP 145/93  ST 138  Ax temp 100.6.  Lungs sounds with rhonchi, & wheezes.  Labored breathing.  Attempted to wean o2 to 50% venti, sats 83-88%. MD at bedside to assess pt.   Increased to 100% NRB and then placed on Bipap.  Pt breathing comfortably on bipap, but RR 30s and she states that she is fatigued.  Daughter at bedside.  Pt transported to 3109 via bed with O2 and heart monitor.

## 2012-04-19 NOTE — Progress Notes (Signed)
Internal Medicine Teaching Service Attending Note Date: 04/19/2012  Patient name: Patricia Farrell  Medical record number: [id number]  Date of birth: 04-10-1965    This patient has been seen and discussed with the house staff. Please see their note for complete details. I concur with their findings with the following additions/corrections:  I saw the patient with the team this morning.  She had increased WOB, stated feeling very tired, on venti mask with tachypnea, reported saturations overnight were in the 70s prior to VM being placed, ABG revealed very low pO2 (33).  Requested our PCCM colleagues to evaluate her and they have decided to monitor her in the ICU, which I think is appropriate.  Once she is ready to be transferred out of the ICU, we will resume her care.   MULLEN, EMILY 04/19/2012, 11:14 AM

## 2012-04-19 NOTE — Procedures (Signed)
Intubation Procedure Note Patricia Farrell [id number] Patricia Farrell, Patricia Farrell  Procedure: Intubation Indications: Airway protection and maintenance  Procedure Details Consent: Risks of procedure as well as the alternatives and risks of each were explained to the (patient/caregiver).  Consent for procedure obtained. Time Out: Verified patient identification, verified procedure, site/side was marked, verified correct patient position, special equipment/implants available, medications/allergies/relevent history reviewed, required imaging and test results available.  Performed  Maximum sterile technique was used including antiseptics, cap, gloves, gown, hand hygiene, mask and sheet.  MAC    Evaluation Hemodynamic Status: BP stable throughout; O2 sats: stable throughout Patient's Current Condition: stable Complications: No apparent complications Patient did tolerate procedure well. Chest X-ray ordered to verify placement.  CXR: pending.  Koren Bound 04/19/2012

## 2012-04-19 NOTE — Progress Notes (Signed)
Hypotension;   Fluid bolus givne; we will intiiate CVP monitoring;   Also autoPEEP on vent at a rate of 35; ABG stat

## 2012-04-19 NOTE — Progress Notes (Signed)
BP 80/29 (43) via A-line. 500 cc bolus in given. Will continue to monitor.

## 2012-04-19 NOTE — Consult Note (Signed)
PULMONARY  / CRITICAL CARE MEDICINE Consult Note   Name: Patricia Farrell MRN: [id number] DOB: [birth year]    LOS: 2  REFERRING MD :  Debe Coder, MD ( Internal Medicine Teaching Service )  CHIEF COMPLAINT:  Acute Respiratory failure   BRIEF PATIENT DESCRIPTION:  Patient is a 47 year old female with past medical history most significant for depression who presented on 04/17/12  with chief complaints of fever, chills, body aches and progressive shortness of breath which started 5 days prior to admission.  This morning  Patient had  increased WOB and desatted  in the 70s . Was placed initially on Ventimask along with breathing treatment. He sats increased to 88 % > placed on Bipap and improved with sats now at 98 %   LINES / TUBES: PIV   CULTURES: 1/ 11 Blood cx >> NGTD 1/11 Urine cx >> needs recollection  1/11 Influenza PCR nasal swab >> negative  1/11 HIV >> negative   ANTIBIOTICS: 1/11 Levaquin >>11/13  1/13 Rocephin >>11/13 1/13 Azithromycin >> 11/13 Cefepime >>  SIGNIFICANT EVENTS:  1/13 patient had  increased WOB and desats in the 70s . Was placed initially on Ventimask along with breathing treatment. He sats increased to 88 % > placed on Bipap and transferred to step down     PRIMARY SERVICE:  Internal Medicine Teaching Service  CONSULTANTS: PCCM CODE STATUS: Full  DIET:  NPO DVT Px:  Lovenox GI Px:  None   HISTORY OF PRESENT ILLNESS:   Patient is a 47 year old female with past medical history most significant for depression who presented with chief complaints of fever, chills, body aches and progressive shortness of breath which started 5 days prior to admission. He should said that her boyfriend and his roommates were all suffering from cold symptoms last week. She was apparently well until she started having persistent cough which was associated with shortness of breath about 5 days ago. Patient does not report any sputum production. The cough and shortness of breath  got progressively worse over last 5 days and was associated with fever and chills in the last 3 days. Patient has decreased appetite, diarrhea , generalized weakness, decreased oral intake, headaches and back pain.  Patient denies any chest pain, similar illness in the past.  Patient has 40-pack-year smoking history but quit last week due to the illness. She mentions that she plans to stop smoking.  PAST MEDICAL HISTORY :  Past Medical History  Diagnosis Date  . Depression   . Headache   . Acute bronchopneumonia 04/17/2012   Past Surgical History  Procedure Date  . Tubal ligation   . Abdominal hysterectomy    Prior to Admission medications   Medication Sig Start Date End Date Taking? Authorizing Provider  acetaminophen (TYLENOL) 500 MG tablet Take 1,000 mg by mouth every 6 (six) hours as needed. For pain.   Yes Historical Provider, MD  citalopram (CELEXA) 40 MG tablet Take 40 mg by mouth daily. May take up to 2 additional doses per day if needed for excessive anxiety     Yes Historical Provider, MD  dextromethorphan-guaiFENesin (MUCINEX DM) 30-600 MG per 12 hr tablet Take 1 tablet by mouth every 12 (twelve) hours. For congestion.   Yes Historical Provider, MD  Pseudoeph-Doxylamine-DM-APAP (NYQUIL PO) Take by mouth.   Yes Historical Provider, MD  aspirin EC 81 MG tablet Take 81 mg by mouth daily.      Historical Provider, MD  calcium carbonate (OS-CAL - DOSED IN MG OF  ELEMENTAL CALCIUM) 1250 MG tablet Take 1 tablet by mouth daily.      Historical Provider, MD  fish oil-omega-3 fatty acids 1000 MG capsule Take 2 g by mouth daily.      Historical Provider, MD  Multiple Vitamins-Minerals (MULTIVITAMINS THER. W/MINERALS) TABS Take 1 tablet by mouth daily.      Historical Provider, MD   No Known Allergies  FAMILY HISTORY:  History reviewed. No pertinent family history. SOCIAL HISTORY:  reports that she quit smoking 6 days ago. Her smoking use included Cigarettes. She has a 30 pack-year  smoking history. She quit smokeless tobacco use 6 days ago. She reports that she drinks alcohol. She reports that she does not use illicit drugs.    VITAL SIGNS: Temp:  [98.3 F (36.8 C)-100.7 F (38.2 C)] 100.7 F (38.2 C) (01/13 1007) Pulse Rate:  [101-138] 138  (01/13 1007) Resp:  [20-32] 32  (01/13 1007) BP: (117-145)/(78-97) 140/97 mmHg (01/13 1007) SpO2:  [85 %-98 %] 98 % (01/13 1007) FiO2 (%):  [50 %-100 %] 100 % (01/13 1007) HEMODYNAMICS:   VENTILATOR SETTINGS: Vent Mode:  [-]  FiO2 (%):  [50 %-100 %] 100 % INTAKE / OUTPUT: Intake/Output      01/12 0701 - 01/13 0700 01/13 0701 - 01/14 0700   P.O. 420    I.V. (mL/kg) 1593.8 (21.5)    Total Intake(mL/kg) 2013.8 (27.1)    Net +2013.8         Urine Occurrence 1 x      PHYSICAL EXAMINATION: General:  Ill appearing  Neuro:  Awake and Alert  Cardiovascular:  Tachycardic regular rate, no MRG Lungs:  Coarse breath sounds throughout except right lower lung fields,  wheezing throughout  Abdomen:  Soft, NT, ND, BS  MSK: no edema  Skin: intact    LABS: Cbc  Lab 04/19/12 0630 04/18/12 0710 04/17/12 1733  WBC 8.9 -- --  HGB 12.9 12.2 11.9*  HCT 37.5 36.2 34.5*  PLT 162 130* 114*    Chemistry   Lab 04/19/12 0630 04/18/12 0710 04/17/12 1733 04/17/12 1243  NA 135 139 -- 130*  K 3.6 3.8 -- 3.2*  CL 101 106 -- 93*  CO2 25 22 -- 21  BUN 6 8 -- 12  CREATININE 0.50 0.49* 0.60 --  CALCIUM 8.1* 7.8* -- 8.6  MG -- -- -- 1.5  PHOS -- -- -- --  GLUCOSE 144* 128* -- 120*    Liver fxn  Lab 04/18/12 0710 04/17/12 1243  AST 129* 202*  ALT 97* 134*  ALKPHOS 76 85  BILITOT 0.1* 0.2*  PROT 5.7* 6.5  ALBUMIN 2.4* 2.9*   Sepsis markers  Lab 04/17/12 1313  LATICACIDVEN 0.94  PROCALCITON --    ABG  Lab 04/19/12 0850 04/17/12 1610  PHART 7.474* 7.360  PCO2ART 33.3* 31.3*  PO2ART 33.9* 95.0  HCO3 24.2* 17.6*  TCO2 25.2 18    IMAGING:  CHEST - 2 VIEW 04/18/12 Comparison: 04/17/2012  Findings: Worsening  ill-defined patchy airspace process throughout  the left lung and left lower lobe compatible with multilobar  pneumonia. Stable heart size and vascularity. No significant  effusion. No pneumothorax.  IMPRESSION:  Worsening asymmetric bilateral airspace process compatible with  pneumonia.   ECG: Sinus tachy   DIAGNOSES: Principal Problem:  *Acute bronchopneumonia Active Problems:  Hyponatremia  Transaminitis  Hypokalemia  Depression  Increased anion gap metabolic acidosis  Hematuria of undiagnosed cause   ASSESSMENT / PLAN:  PULMONARY  ASSESSMENT:  Acute respiratory failure  in the setting of pneumonia  and/or  COPD exacerbation ?  H1N1 ?   PLAN:   - BiPAP. - Duonebs q4hrs and prn. - continue Solu-medrol. - Antibx as per ID. - Resp viral panel, urine leg. - F/U Resp culture.   CARDIOVASCULAR  ASSESSMENT:  Sinus tachycardia - likely in the setting of resp status  UDS negative  PLAN:  Monitor   RENAL  ASSESSMENT:   Anion gab acidosis > resolved Hyponatremia > resolved Hypokalemia > resolved  PLAN:   monitor  GASTROINTESTINAL  ASSESSMENT:  Elevated LFT  Hepatitis panel negative HIV negative UDS negtive  PLAN:   Monitor   HEMATOLOGIC  ASSESSMENT:   Thrombocytopenia on admission > improved  PLAN:  Monitor   INFECTIOUS  ASSESSMENT:   Pneumonia  H1N1 ?  PLAN:   - D/C Rocephin  - Will start Cefepime  - Continue  Azithromycin  - Start Tamiflu until resp viral panel negative -  Resp viral panel ( sputum)  - Urine leg  - f/u cultures    ENDOCRINE  ASSESSMENT:   Currently on Solumedrol  PLAN:   - Continue - SSI   NEUROLOGIC  ASSESSMENT:  Depression  PLAN:   Continue celexa    CLINICAL SUMMARY: Patient is a 47 year old female with past medical history most significant for depression who presented on 04/17/12  with chief complaints of fever, chills, body aches and progressive shortness of breath which started 5 days prior to  admission.  This morning  Patient had  increased WOB and desatted  in the 70s . Was placed initially on Ventimask along with breathing treatment. He sats increased to 88 % > placed on Bipap . Transfer to step down. Broaden antibiotic spectrum , start Tamiflu  PCCM will take over care.   Almyra Deforest, MD PGY III Internal Medicine Resident  04/19/2012, 10:09 AM  I have personally obtained a history, examined the patient, evaluated laboratory and imaging results, formulated the assessment and plan and placed orders.  Transfer to the ICU for closer monitoring, BiPAP for support, if fails then will intubate but seems to be improving with BiPAP.    CRITICAL CARE: The patient is critically ill with multiple organ systems failure and requires high complexity decision making for assessment and support, frequent evaluation and titration of therapies, application of advanced monitoring technologies and extensive interpretation of multiple databases. Critical Care Time devoted to patient care services described in this note is 45 minutes.   Patient seen and examined, agree with above note.  I dictated the care and orders written for this patient under my direction.  Koren Bound, M.D. [phone]

## 2012-04-19 NOTE — Progress Notes (Signed)
Patient  Continues on 02NC/4L  Oxygen saturation stool at 90% respiratory therapist called in 2xs for breathing TX. The last one at 0503.The night NP notified . Ativan 1mg  given for Anxiety and tylenol for H/A. With good effect as reported by patient.At the time of shift change patient 02 sat stood at 88%, Rapid response notified by the charge nurse, A non rebreather mask placed for a few minutes 02 sat 95%, D/C and patient returned to nasal canula.Sat at this time 92%.

## 2012-04-19 NOTE — Procedures (Signed)
Central Venous Catheter Insertion Procedure Note LISSETTE SCHENK [id number] 1965/06/15  Procedure: Insertion of Central Venous Catheter Indications: Assessment of intravascular volume  Procedure Details Consent: Risks of procedure as well as the alternatives and risks of each were explained to the (patient/caregiver).  Consent for procedure obtained. Time Out: Verified patient identification, verified procedure, site/side was marked, verified correct patient position, special equipment/implants available, medications/allergies/relevent history reviewed, required imaging and test results available.  Performed  Maximum sterile technique was used including antiseptics, cap, gloves, gown, hand hygiene, mask and sheet. Skin prep: Chlorhexidine; local anesthetic administered A antimicrobial bonded/coated triple lumen catheter was placed in the right internal jugular vein using the Seldinger technique.  Evaluation Blood flow good Complications: No apparent complications Patient did tolerate procedure well. Chest X-ray ordered to verify placement.  CXR: pending.  :U/S used in placement.  YACOUB,WESAM 04/19/2012, 3:43 PM

## 2012-04-19 NOTE — Progress Notes (Signed)
Notified of Panic Value; CO2:57.9 and bicarb:25.6. Notified Dr. Molli Knock. Will continue to monitor.

## 2012-04-19 NOTE — Progress Notes (Signed)
Respiratory Note  Pt in distress on 5 LPM [place] O2 sats  77-82 %.  Pt had been up and moving..  Rn placed pt on 100% NRB sats  Improved to 93-96%. Changed back to 50 % venti mask. MD called and  made aware.

## 2012-04-19 NOTE — Progress Notes (Signed)
Lab called and said they were unable to complete the respiratory panel sent by RT. Lab spoke to RT. I notified lab that there is also a respiratory culture ordered. Will notify RT. Will continue to monitor.

## 2012-04-19 NOTE — Procedures (Signed)
Arterial Catheter Insertion Procedure Note Patricia Farrell [id number] 27-Jan-1966  Procedure: Insertion of Arterial Catheter  Indications: Blood pressure monitoring  Procedure Details Consent: Risks of procedure as well as the alternatives and risks of each were explained to the (patient/caregiver).  Consent for procedure obtained. Time Out: Verified patient identification, verified procedure, site/side was marked, verified correct patient position, special equipment/implants available, medications/allergies/relevent history reviewed, required imaging and test results available.  Performed  Maximum sterile technique was used including antiseptics, cap, gloves, gown, hand hygiene, mask and sheet. Skin prep: Chlorhexidine; local anesthetic administered 20 gauge catheter was inserted into right radial artery using the Seldinger technique.  Evaluation Blood flow good; BP tracing good. Complications: No apparent complications.  Koren Bound 04/19/2012

## 2012-04-19 NOTE — Progress Notes (Signed)
Patricia Farrell arrived to 3100 at approximately 1027. 3100 staff members were getting the patient situated and Patricia Farrell  was logged into the computer. I Patricia Farrell, Charity fundraiser) did not realize this at this time and continued to chart the patients initial assessment under Patricia Farrell. Patricia Farrell is aware.

## 2012-04-19 NOTE — Progress Notes (Signed)
Resident Interval Note :  Patient evaluated . Was noted to be in worsening resp status with RR of 30-35 and tachycardic. Patient seemed to get more tired . Reviewed ABG.  Discussed with patient and proceed with intubation for worsening resp status .   Almyra Deforest, MD PGY III Internal Medicine Resident 04/19/12 4:19 pm

## 2012-04-19 NOTE — Progress Notes (Signed)
ET tube to be retracted 2-3 cm; repeat CXR to confirm;

## 2012-04-20 ENCOUNTER — Inpatient Hospital Stay (HOSPITAL_COMMUNITY): Payer: MEDICAID

## 2012-04-20 LAB — BLOOD GAS, ARTERIAL
Acid-Base Excess: 1.2 mmol/L (ref 0.0–2.0)
Drawn by: 347621
Drawn by: 277331
FIO2: 0.6 %
MECHVT: 360 mL
O2 Saturation: 98.5 %
PEEP: 10 cmH2O
Patient temperature: 98.6
RATE: 24 resp/min
RATE: 24 resp/min
TCO2: 27.9 mmol/L (ref 0–100)
pCO2 arterial: 44 mmHg (ref 35.0–45.0)
pH, Arterial: 7.386 (ref 7.350–7.450)
pO2, Arterial: 68.6 mmHg — ABNORMAL LOW (ref 80.0–100.0)

## 2012-04-20 LAB — COMPREHENSIVE METABOLIC PANEL
ALT: 62 U/L — ABNORMAL HIGH (ref 0–35)
Alkaline Phosphatase: 63 U/L (ref 39–117)
BUN: 8 mg/dL (ref 6–23)
CO2: 26 mEq/L (ref 19–32)
Chloride: 104 mEq/L (ref 96–112)
GFR calc Af Amer: >90 mL/min (ref >90)
GFR calc non Af Amer: >90 mL/min (ref >90)
Glucose, Bld: 167 mg/dL — ABNORMAL HIGH (ref 70–99)
Potassium: 3.5 mEq/L (ref 3.5–5.1)
Sodium: 140 mEq/L (ref 135–145)
Total Bilirubin: 0.2 mg/dL — ABNORMAL LOW (ref 0.3–1.2)
Total Protein: 5.1 g/dL — ABNORMAL LOW (ref 6.0–8.3)

## 2012-04-20 LAB — URINALYSIS, ROUTINE W REFLEX MICROSCOPIC
Ketones, ur: NEGATIVE mg/dL
Leukocytes, UA: NEGATIVE
Nitrite: NEGATIVE
Protein, ur: NEGATIVE mg/dL

## 2012-04-20 LAB — LEGIONELLA ANTIGEN, URINE: Legionella Antigen, Urine: NEGATIVE

## 2012-04-20 LAB — CBC WITH DIFFERENTIAL/PLATELET
Basophils Relative: 1 % (ref 0–1)
Eosinophils Absolute: 0 10*3/uL (ref 0.0–0.7)
Eosinophils Relative: 0 % (ref 0–5)
HCT: 30.8 % — ABNORMAL LOW (ref 36.0–46.0)
Hemoglobin: 10.6 g/dL — ABNORMAL LOW (ref 12.0–15.0)
Lymphocytes Relative: 11 % — ABNORMAL LOW (ref 12–46)
MCHC: 34.4 g/dL (ref 30.0–36.0)
Neutro Abs: 8.3 10*3/uL — ABNORMAL HIGH (ref 1.7–7.7)
RBC: 3.46 MIL/uL — ABNORMAL LOW (ref 3.87–5.11)

## 2012-04-20 LAB — TROPONIN I: Troponin I: <0.3 ng/mL (ref <0.30)

## 2012-04-20 MED ORDER — VANCOMYCIN HCL IN DEXTROSE 1-5 GM/200ML-% IV SOLN
1000.0000 mg | Freq: Two times a day (BID) | INTRAVENOUS | Status: DC
  Administered 2012-04-20 – 2012-04-22 (×5): 1000 mg via INTRAVENOUS
  Filled 2012-04-20 (×6): qty 200

## 2012-04-20 MED ORDER — SODIUM CHLORIDE 0.9 % IV SOLN
25.0000 ug/h | INTRAVENOUS | Status: DC
  Administered 2012-04-20 – 2012-04-21 (×2): 200 ug/h via INTRAVENOUS
  Administered 2012-04-22: 175 ug/h via INTRAVENOUS
  Administered 2012-04-23: 75 ug/h via INTRAVENOUS
  Filled 2012-04-20 (×5): qty 50

## 2012-04-20 MED ORDER — POTASSIUM CHLORIDE 20 MEQ/15ML (10%) PO LIQD
40.0000 meq | Freq: Once | ORAL | Status: AC
  Administered 2012-04-20: 40 meq
  Filled 2012-04-20: qty 30

## 2012-04-20 MED ORDER — PANTOPRAZOLE SODIUM 40 MG IV SOLR
40.0000 mg | INTRAVENOUS | Status: DC
  Administered 2012-04-20 – 2012-04-25 (×6): 40 mg via INTRAVENOUS
  Filled 2012-04-20 (×9): qty 40

## 2012-04-20 MED ORDER — CISATRACURIUM BOLUS VIA INFUSION
0.1000 mg/kg | Freq: Once | INTRAVENOUS | Status: AC
  Administered 2012-04-20: 7.7 mg via INTRAVENOUS
  Filled 2012-04-20: qty 8

## 2012-04-20 MED ORDER — FENTANYL BOLUS VIA INFUSION
25.0000 ug | Freq: Four times a day (QID) | INTRAVENOUS | Status: DC | PRN
  Filled 2012-04-20: qty 100

## 2012-04-20 MED ORDER — PROPOFOL 10 MG/ML IV EMUL
5.0000 ug/kg/min | INTRAVENOUS | Status: DC
  Administered 2012-04-20: 25 ug/kg/min via INTRAVENOUS
  Administered 2012-04-20: 20 ug/kg/min via INTRAVENOUS
  Administered 2012-04-21 (×2): 25 ug/kg/min via INTRAVENOUS
  Administered 2012-04-22: 8 ug/kg/min via INTRAVENOUS
  Administered 2012-04-22: 20 ug/kg/min via INTRAVENOUS
  Filled 2012-04-20 (×7): qty 100

## 2012-04-20 MED ORDER — FUROSEMIDE 10 MG/ML IJ SOLN
40.0000 mg | Freq: Two times a day (BID) | INTRAMUSCULAR | Status: AC
  Administered 2012-04-20 – 2012-04-21 (×4): 40 mg via INTRAVENOUS
  Filled 2012-04-20 (×5): qty 4

## 2012-04-20 MED ORDER — CHLORHEXIDINE GLUCONATE 0.12 % MT SOLN: 15.0000 mL | Freq: Two times a day (BID) | OROMUCOSAL | Status: DC

## 2012-04-20 MED ORDER — PRO-STAT SUGAR FREE PO LIQD
60.0000 mL | Freq: Three times a day (TID) | ORAL | Status: DC
  Administered 2012-04-20 – 2012-04-21 (×3): 60 mL
  Filled 2012-04-20 (×5): qty 60

## 2012-04-20 MED ORDER — OSELTAMIVIR PHOSPHATE 75 MG PO CAPS
150.0000 mg | ORAL_CAPSULE | Freq: Two times a day (BID) | ORAL | Status: DC
  Administered 2012-04-20 – 2012-04-22 (×5): 150 mg via ORAL
  Filled 2012-04-20 (×6): qty 2

## 2012-04-20 MED ORDER — METHYLPREDNISOLONE SODIUM SUCC 125 MG IJ SOLR
60.0000 mg | Freq: Two times a day (BID) | INTRAMUSCULAR | Status: DC
Start: 2012-04-20 — End: 2012-04-21
  Administered 2012-04-20: 60 mg via INTRAVENOUS
  Filled 2012-04-20 (×4): qty 0.96

## 2012-04-20 MED ORDER — PROPOFOL 10 MG/ML IV EMUL
INTRAVENOUS | Status: AC
  Administered 2012-04-20: 20 ug/kg/min via INTRAVENOUS
  Filled 2012-04-20: qty 100

## 2012-04-20 MED ORDER — BIOTENE DRY MOUTH MT LIQD: 15.0000 mL | Freq: Four times a day (QID) | OROMUCOSAL | Status: DC

## 2012-04-20 MED ORDER — ADULT MULTIVITAMIN LIQUID CH
5.0000 mL | Freq: Every day | ORAL | Status: DC
  Administered 2012-04-20 – 2012-04-25 (×6): 5 mL
  Filled 2012-04-20 (×8): qty 5

## 2012-04-20 MED ORDER — OXEPA PO LIQD
1000.0000 mL | ORAL | Status: DC
  Administered 2012-04-20: 1000 mL
  Filled 2012-04-20 (×3): qty 1000

## 2012-04-20 MED ORDER — MIDAZOLAM BOLUS VIA INFUSION
1.0000 mg | INTRAVENOUS | Status: DC | PRN
  Filled 2012-04-20: qty 2

## 2012-04-20 MED ORDER — LEVOFLOXACIN IN D5W 750 MG/150ML IV SOLN
750.0000 mg | INTRAVENOUS | Status: DC
  Administered 2012-04-20 – 2012-04-26 (×7): 750 mg via INTRAVENOUS
  Filled 2012-04-20 (×9): qty 150

## 2012-04-20 MED ORDER — CISATRACURIUM BESYLATE 10 MG/ML IV SOLN
3.0000 ug/kg/min | INTRAVENOUS | Status: DC
  Administered 2012-04-20: 3 ug/kg/min via INTRAVENOUS
  Administered 2012-04-21: 1.5 ug/kg/min via INTRAVENOUS
  Filled 2012-04-20 (×2): qty 20

## 2012-04-20 MED ORDER — ARTIFICIAL TEARS OP OINT
1.0000 "application " | TOPICAL_OINTMENT | Freq: Three times a day (TID) | OPHTHALMIC | Status: DC
  Administered 2012-04-21 – 2012-04-22 (×5): 1 via OPHTHALMIC
  Filled 2012-04-20 (×2): qty 3.5

## 2012-04-20 MED ORDER — JEVITY 1.2 CAL PO LIQD
1000.0000 mL | ORAL | Status: DC
  Administered 2012-04-20: 12:00:00
  Filled 2012-04-20 (×2): qty 1000

## 2012-04-20 MED ORDER — SODIUM CHLORIDE 0.9 % IV SOLN
1.0000 mg/h | INTRAVENOUS | Status: DC
  Administered 2012-04-20: 2 mg/h via INTRAVENOUS
  Filled 2012-04-20: qty 10

## 2012-04-20 NOTE — Progress Notes (Signed)
PULMONARY  / CRITICAL CARE MEDICINE Consult Note   Name: Patricia Farrell MRN: [id number] DOB: 06-17-1965    LOS: 3  REFERRING MD :  Debe Coder, MD ( Internal Medicine Teaching Service )  CHIEF COMPLAINT:  Acute Respiratory failure   BRIEF PATIENT DESCRIPTION:  Patient is a 47 year old female with past medical history most significant for depression who presented on 04/17/12  with chief complaints of fever, chills, body aches and progressive shortness of breath which started 5 days prior to admission.  This morning  Patient had  increased WOB and desatted  in the 70s . Was placed initially on Ventimask along with breathing treatment. He sats increased to 88 % > placed on Bipap and improved with sats now at 98 %   LINES / TUBES: 1/13 ETT>>> 1/13 rt Ij>>> 1/3 a line>>>1/14 1/14 rt rad>>>  CULTURES: 1/ 11 Blood cx >> NGTD 1/11 Urine cx >>contam 1/11 Influenza PCR nasal swab >> negative  1/11 HIV >> negative 1/13 resp viral panel>>>  ANTIBIOTICS: 1/11 Levaquin >>11/13  1/13 Rocephin >>11/13 1/13 Azithromycin >>>1/14 1/14 levofloxacin>>> 11/13 Cefepime >>> 1/14 vanc>>> 1/13 tamiflu>>>  SIGNIFICANT EVENTS:  1/13 patient had  increased WOB and desats in the 70s . Was placed initially on Ventimask along with breathing treatment. He sats increased to 88 % > placed on Bipap and transferred to step down 1/13- ETT placed, ARDS, cvp 17, pos 4.5 liters  PRIMARY SERVICE:  PCCM, was [facility] CONSULTANTS:  CODE STATUS: Full  DIET:  TF 1./14 DVT Px:  Lovenox GI Px:  None   VITAL SIGNS: Temp:  [97.4 F (36.3 C)-100.7 F (38.2 C)] 97.4 F (36.3 C) (01/14 0400) Pulse Rate:  [77-138] 81  (01/14 0840) Resp:  [15-38] 27  (01/14 0840) BP: (91-141)/(32-98) 115/56 mmHg (01/14 0840) SpO2:  [88 %-99 %] 95 % (01/14 0840) Arterial Line BP: (80-144)/(29-86) 111/52 mmHg (01/14 0800) FiO2 (%):  [50 %-100 %] 60 % (01/14 0840) Weight:  [77.2 kg (170 lb 3.1 oz)] 77.2 kg (170 lb 3.1 oz) (01/13  1027) HEMODYNAMICS: CVP:  [4 mmHg-16 mmHg] 16 mmHg VENTILATOR SETTINGS: Vent Mode:  [-] PCV FiO2 (%):  [50 %-100 %] 60 % Set Rate:  [16 bmp-35 bmp] 30 bmp Vt Set:  [500 mL] 500 mL PEEP:  [5 cmH20] 5 cmH20 Pressure Support:  [5 cmH20] 5 cmH20 Plateau Pressure:  [21 cmH20-23 cmH20] 23 cmH20 INTAKE / OUTPUT: Intake/Output      01/13 0701 - 01/14 0700 01/14 0701 - 01/15 0700   P.O.     I.V. (mL/kg) 4496.1 (58.2) 324.5 (4.2)   IV Piggyback 910 250   Total Intake(mL/kg) 5406.1 (70) 574.5 (7.4)   Urine (mL/kg/hr) 1010 (0.5) 95   Total Output 1010 95   Net +4396.1 +479.5          PHYSICAL EXAMINATION: General: rass -2 Neuro: nonfocal, follows commands Cardiovascular:  s1 s2 RRR Lungs:  Coarse breath sounds diffuse, exp wheezing  Abdomen:  Soft, NT, ND, BS  MSK: no edema  Skin: intact    LABS: Cbc  Lab 04/20/12 0520 04/19/12 0630 04/18/12 0710  WBC 10.1 -- --  HGB 10.6* 12.9 12.2  HCT 30.8* 37.5 36.2  PLT 150 162 130*    Chemistry   Lab 04/20/12 0520 04/19/12 0630 04/18/12 0710 04/17/12 1243  NA 140 135 139 --  K 3.5 3.6 3.8 --  CL 104 101 106 --  CO2 26 25 22  --  BUN 8 6 8  --  CREATININE 0.35* 0.50 0.49* --  CALCIUM 7.2* 8.1* 7.8* --  MG -- -- -- 1.5  PHOS -- -- -- --  GLUCOSE 167* 144* 128* --    Liver fxn  Lab 04/20/12 0520 04/18/12 0710 04/17/12 1243  AST 71* 129* 202*  ALT 62* 97* 134*  ALKPHOS 63 76 85  BILITOT 0.2* 0.1* 0.2*  PROT 5.1* 5.7* 6.5  ALBUMIN 2.0* 2.4* 2.9*   Sepsis markers  Lab 04/17/12 1313  LATICACIDVEN 0.94  PROCALCITON --    ABG  Lab 04/20/12 0659 04/19/12 1921 04/19/12 1601  PHART 7.386 7.384 7.268*  PCO2ART 44.0 42.1 57.9*  PO2ART 68.6* 123.0* 66.7*  HCO3 25.7* 24.5* 25.6*  TCO2 27.0 25.7 27.4    IMAGING:  CHEST - 2 VIEW 01/14- continued bilateral infiltrates, et wnl, left greater rt  ECG: Sinus tachy   DIAGNOSES: Principal Problem:  *Acute bronchopneumonia Active Problems:  Hyponatremia  Transaminitis   Hypokalemia  Depression  Increased anion gap metabolic acidosis  Hematuria of undiagnosed cause  Acute respiratory failure  Hypoxemia  COPD (chronic obstructive pulmonary disease)   ASSESSMENT / PLAN:  PULMONARY  ASSESSMENT:  Acute respiratory failure in the setting of pneumonia, r/o H1N!, ARDS severe met, copd  PLAN:   - agree on ARDS protocol -STOP volume, cvp goal to 4, lasix, kvo, cvp 17 -pao2 to fio2 = 66, add paralysis -peep now 8, if peep to 10 again, may consider prone position, does not meet NEJM criteria now -Avoid rates higher 24 in copd, maintain current mV - Duonebs q4hrs and prn. - continue Solu-medrol, reduce when able when wheezing improved -abg after paralysis  CARDIOVASCULAR  ASSESSMENT:  Sinus tachycardia - likely in the setting of resp status  UDS negative  PLAN:  Monitor trop x 1 ecg neg Lasix cvp   RENAL  ASSESSMENT:   Anion gab acidosis > resolved Hyponatremia > resolved Hypokalemia mild ARDS  PLAN:   kvo k supp Lasix, neg 1 liter goal  GASTROINTESTINAL  ASSESSMENT:  Elevated LFT  Hepatitis panel negative HIV negative UDS negtive  PLAN:   START  Tf  ppi lft again in am   HEMATOLOGIC  ASSESSMENT:   Thrombocytopenia on admission > improved  PLAN:  Monitor on lovenox sub q Cbc in am   INFECTIOUS  ASSESSMENT:   Pneumonia  H1N1 ? , may be false neg PLAN:   - change azithro to levo -add vanc -Continue cefipime -continue tamiflu  ENDOCRINE  ASSESSMENT:   Currently on Solumedrol  PLAN:   - Continue - SSI -reduce steroids to q12h  NEUROLOGIC  ASSESSMENT:  Depression  PLAN:   Continue celexa, may consider dc with risk ser syndrome on fentanyl Severe ards, , add paralysis Increased sedation to rass -3 prior   CLINICAL SUMMARY: Patient is a 47 year old female , ARDS, PNA, VDRF. Add paralysis, increased sedation, start TF , lasix, kvo.  CRITICAL CARE: The patient is critically ill with multiple organ  systems failure and requires high complexity decision making for assessment and support, frequent evaluation and titration of therapies, application of advanced monitoring technologies and extensive interpretation of multiple databases. Critical Care Time devoted to patient care services described in this note is 35 minutes.   Mcarthur Rossetti. Tyson Alias, MD, FACP Pgr: [phone] [facility]

## 2012-04-20 NOTE — Progress Notes (Signed)
Patient discussed in 3100 rounds with multidisciplinary team-  No CSW needs identified at this time. CSW will follow along to further assess and provide support/assistance as needed. Please call CSW as needed- Reece Levy, MSW, [employer] [phone]

## 2012-04-20 NOTE — Progress Notes (Signed)
ART line out and not functioning. RT removed and will replaced per MD orders.

## 2012-04-20 NOTE — Progress Notes (Signed)
Transfer to 2100

## 2012-04-20 NOTE — Procedures (Signed)
Arterial Catheter Insertion Procedure Note Patricia Farrell [id number] 1965/11/05  Procedure: Insertion of Arterial Catheter  Indications: Blood pressure monitoring and Frequent blood sampling  Procedure Details Consent: Unable to obtain consent because of altered level of consciousness. Time Out: Verified patient identification, verified procedure, site/side was marked, verified correct patient position, special equipment/implants available, medications/allergies/relevent history reviewed, required imaging and test results available.  Performed  Maximum sterile technique was used including antiseptics, cap, gloves, gown, hand hygiene, mask and sheet. Skin prep: Chlorhexidine; local anesthetic administered 20 gauge catheter was inserted into right radial artery using the Seldinger technique.  Evaluation Blood flow good; BP tracing good. Complications: No apparent complications.   Patricia Farrell Patricia Farrell 04/20/2012

## 2012-04-20 NOTE — Progress Notes (Signed)
eLink Physician-Brief Progress Note Patient Name: Patricia Farrell DOB: 05/28/1965 MRN: [id number]  Date of Service  04/20/2012   HPI/Events of Note  Call from RN/RT about if patien needs abg  Case reviewed: looks like severe ARDS with PF ratio 123 as of 04/20/11 7pm due to pneumococcal CAP. Patient currently on PCV and doing Vt of 500 (her 6cc is 330, 7cc is 380 and 8cc is 440) for heigh 5'3". Per RN patient RASS 0 and communicative  eICU Interventions  Patient has severe ARDS  Therefore - change to deep sedation  - change to aRDS net protocol - reinsert a line to track abg  - if severe ARDS persists, paralyze/prone    Intervention Category Major Interventions: Respiratory failure - evaluation and management Intermediate Interventions: Communication with other healthcare providers and/or family;Diagnostic test evaluation  Tyquavious Gamel 04/20/2012, 5:25 AM

## 2012-04-20 NOTE — Progress Notes (Signed)
ANTIBIOTIC CONSULT NOTE - INITIAL  Pharmacy Consult for vancomycin Indication: rule out pneumonia  No Known Allergies  Patient Measurements: Height: 5\' 3"  (160 cm) Weight: 170 lb 3.1 oz (77.2 kg) IBW/kg (Calculated) : 52.4   Vital Signs: Temp: 97.4 F (36.3 C) (01/14 0400) Temp src: Axillary (01/14 0400) BP: 104/68 mmHg (01/14 0900) Pulse Rate: 91  (01/14 0900) Intake/Output from previous day: 01/13 0701 - 01/14 0700 In: 5406.1 [I.V.:4496.1; IV Piggyback:910] Out: 1010 [Urine:1010] Intake/Output from this shift: Total I/O In: 713.5 [I.V.:463.5; IV Piggyback:250] Out: 95 [Urine:95]  Labs:  [facility] 04/20/12 0520 04/19/12 0630 04/18/12 0710  WBC 10.1 8.9 7.4  HGB 10.6* 12.9 12.2  PLT 150 162 130*  LABCREA -- -- --  CREATININE 0.35* 0.50 0.49*   Estimated Creatinine Clearance: 84.6 ml/min (by C-G formula based on Cr of 0.35). No results found for this basename: VANCOTROUGH:2,VANCOPEAK:2,VANCORANDOM:2,GENTTROUGH:2,GENTPEAK:2,GENTRANDOM:2,TOBRATROUGH:2,TOBRAPEAK:2,TOBRARND:2,AMIKACINPEAK:2,AMIKACINTROU:2,AMIKACIN:2, in the last 72 hours   Microbiology: Recent Results (from the past 720 hour(s))  CULTURE, BLOOD (ROUTINE X 2)     Status: Normal (Preliminary result)   Collection Time   04/17/12 12:50 PM      Component Value Range Status Comment   Specimen Description BLOOD RIGHT ARM   Final    Special Requests BOTTLES DRAWN AEROBIC AND ANAEROBIC 10CC   Final    Culture  Setup Time 04/17/2012 18:03   Final    Culture     Final    Value:        BLOOD CULTURE RECEIVED NO GROWTH TO DATE CULTURE WILL BE HELD FOR 5 DAYS BEFORE ISSUING A FINAL NEGATIVE REPORT   Report Status PENDING   Incomplete   CULTURE, BLOOD (ROUTINE X 2)     Status: Normal (Preliminary result)   Collection Time   04/17/12 12:55 PM      Component Value Range Status Comment   Specimen Description BLOOD RIGHT HAND   Final    Special Requests BOTTLES DRAWN AEROBIC ONLY 4CC   Final    Culture  Setup Time  04/17/2012 18:03   Final    Culture     Final    Value:        BLOOD CULTURE RECEIVED NO GROWTH TO DATE CULTURE WILL BE HELD FOR 5 DAYS BEFORE ISSUING A FINAL NEGATIVE REPORT   Report Status PENDING   Incomplete   URINE CULTURE     Status: Normal   Collection Time   04/17/12  2:21 PM      Component Value Range Status Comment   Specimen Description URINE, CLEAN CATCH   Final    Special Requests Normal   Final    Culture  Setup Time 04/17/2012 20:18   Final    Colony Count 40,000 COLONIES/ML   Final    Culture     Final    Value: Multiple bacterial morphotypes present, none predominant. Suggest appropriate recollection if clinically indicated.   Report Status 04/18/2012 FINAL   Final   MRSA PCR SCREENING     Status: Normal   Collection Time   04/19/12 10:27 AM      Component Value Range Status Comment   MRSA by PCR NEGATIVE  NEGATIVE Final     Medical History: Past Medical History  Diagnosis Date  . Depression   . Headache   . Acute bronchopneumonia 04/17/2012  . COPD (chronic obstructive pulmonary disease) 04/19/2012    Medications:  Anti-infectives     Start     Dose/Rate Route Frequency Ordered Stop  04/20/12 1000   levofloxacin (LEVAQUIN) IVPB 750 mg        750 mg 100 mL/hr over 90 Minutes Intravenous Every 24 hours 04/20/12 0850     04/20/12 1000   vancomycin (VANCOCIN) IVPB 1000 mg/200 mL premix        1,000 mg 200 mL/hr over 60 Minutes Intravenous Every 12 hours 04/20/12 0909     04/19/12 1200   oseltamivir (TAMIFLU) capsule 75 mg        75 mg Oral 2 times daily 04/19/12 1117 04/24/12 0959   04/19/12 1200   ceFEPIme (MAXIPIME) 1 g in dextrose 5 % 50 mL IVPB        1 g 100 mL/hr over 30 Minutes Intravenous Every 12 hours 04/19/12 1117     04/19/12 0815   cefTRIAXone (ROCEPHIN) 1 g in dextrose 5 % 50 mL IVPB  Status:  Discontinued        1 g 100 mL/hr over 30 Minutes Intravenous Every 24 hours 04/19/12 0808 04/19/12 1117   04/19/12 0815   azithromycin (ZITHROMAX)  500 mg in dextrose 5 % 250 mL IVPB  Status:  Discontinued        500 mg 250 mL/hr over 60 Minutes Intravenous Every 24 hours 04/19/12 0809 04/20/12 0850   04/18/12 1500   levofloxacin (LEVAQUIN) IVPB 750 mg  Status:  Discontinued        750 mg 100 mL/hr over 90 Minutes Intravenous Every 24 hours 04/17/12 1717 04/19/12 0808   04/17/12 1445   levofloxacin (LEVAQUIN) IVPB 750 mg  Status:  Discontinued        750 mg 100 mL/hr over 90 Minutes Intravenous Every 24 hours 04/17/12 1439 04/17/12 1720         Assessment: 46 yof presented with fever, cough and SOB and was started on azithromycin + ceftriaxone. Subsequently required intubation and antibiotic coverage was broadened. Now adding vanc. Pt continues on Tamiflu however, flu panel is negative.  1/11 Flu - NEG 1/13 Legionella - NEG 1/13 MRSA - NEG 1/11 Urine - 40K, non-predominant 1/11 Blood - NGTD  Cefepime 1/13>> Tamiflu 1/13>> Vanc 1/14>> Levaquin 1/14>> Ceftriaxone 1/13 x 1 Azithro 1/13>>1/14  Goal of Therapy:  Vancomycin trough level 15-20 mcg/ml  Plan:  1. Vancomycin 1gm IV Q12H 2. Continue levaquin + cefepime as ordered. May want to increase cefepime to 1gm IV Q8H 3. Consider DC tamiflu 4. F/u renal fxn, C&S, clinical status and trough at [facility], Drake Leach 04/20/2012,9:10 AM

## 2012-04-20 NOTE — Progress Notes (Signed)
INITIAL NUTRITION ASSESSMENT  DOCUMENTATION CODES Per approved criteria  -Not Applicable   INTERVENTION: Initiate Oxepa @ 10 ml/hr - goal rate. 60 ml Prostat TID.  At goal rate, tube feeding regimen will provide 960 kcal, 105 grams of protein, and 188 ml of H2O.  With current rate of Propofol will provide a total calories of 1448 kcal (99% of kcal needs)    MVI daily  NUTRITION DIAGNOSIS: Inadequate oral intake related to inability to eat as evidenced by NPO status.  Goal: Pt to meet >/= 90% of their estimated nutrition needs.  Monitor:  Vent status, TF initiation and tolerance  Reason for Assessment: Consult received to initiate and manage enteral nutrition support.   47 y.o. female  Admitting Dx: Acute bronchopneumonia  ASSESSMENT: Patient is currently intubated on ventilator support for Acute bronchopneumonia. Pt with severe ARDS. Pt starting on paralytic today.  Pt discussed during ICU rounds and with RN.   MV: 8.8 Temp:Temp (24hrs), Avg:97.9 F (36.6 C), Min:96.4 F (35.8 C), Max:99.8 F (37.7 C)  Propofol: 18.5 ml/hr providing 488 kcal per day from lipid   Height: Ht Readings from Last 1 Encounters:  04/19/12 5\' 3"  (1.6 m)    Weight: 163 lb 1/11 - admission Wt Readings from Last 1 Encounters:  04/19/12 170 lb 3.1 oz (77.2 kg)    Ideal Body Weight: 52.2 kg  % Ideal Body Weight: 148%  Wt Readings from Last 10 Encounters:  04/19/12 170 lb 3.1 oz (77.2 kg)    Usual Body Weight: unknown  % Usual Body Weight: -  BMI:  Body mass index is 30.15 kg/(m^2). Admission BMI: 28.9 - overweight  Estimated Nutritional Needs: Kcal: 1461 Protein: 100-125 grams Fluid: >1.5 L/day  Skin:  No issues noted  Diet Order: NPO  EDUCATION NEEDS: -No education needs identified at this time   Intake/Output Summary (Last 24 hours) at 04/20/12 1035 Last data filed at 04/20/12 0925  Gross per 24 hour  Intake 3965.69 ml  Output   1105 ml  Net 2860.69 ml     Last BM: 1/9 last documented   Labs:   Lab 04/20/12 0520 04/19/12 0630 04/18/12 0710 04/17/12 1243  NA 140 135 139 --  K 3.5 3.6 3.8 --  CL 104 101 106 --  CO2 26 25 22  --  BUN 8 6 8  --  CREATININE 0.35* 0.50 0.49* --  CALCIUM 7.2* 8.1* 7.8* --  MG -- -- -- 1.5  PHOS -- -- -- --  GLUCOSE 167* 144* 128* --    CBG (last 3)  No results found for this basename: GLUCAP:3 in the last 72 hours  Scheduled Meds:   . antiseptic oral rinse  15 mL Mouth Rinse QID  . artificial tears  1 application Both Eyes Q8H  . ceFEPime (MAXIPIME) IV  1 g Intravenous Q12H  . chlorhexidine  15 mL Mouth Rinse BID  . cisatracurium  0.1 mg/kg Intravenous Once  . enoxaparin (LOVENOX) injection  40 mg Subcutaneous Q24H  . feeding supplement (JEVITY 1.2 CAL)  1,000 mL Per Tube Q24H  . furosemide  40 mg Intravenous Q12H  . levalbuterol  1.25 mg Nebulization Q4H   And  . ipratropium  0.5 mg Nebulization Q4H  . levofloxacin (LEVAQUIN) IV  750 mg Intravenous Q24H  . methylPREDNISolone (SOLU-MEDROL) injection  60 mg Intravenous Q12H  . oseltamivir  150 mg Oral BID  . pantoprazole (PROTONIX) IV  40 mg Intravenous Q24H  . potassium chloride  40 mEq Per  Tube Once  . sodium chloride  1,000 mL Intravenous Once  . vancomycin  1,000 mg Intravenous Q12H    Continuous Infusions:   . sodium chloride 20 mL/hr at 04/20/12 0925  . cisatracurium (NIMBEX) infusion    . fentaNYL infusion INTRAVENOUS 100 mcg/hr (04/20/12 0925)  . midazolam (VERSED) infusion 4 mg/hr (04/20/12 4098)    Past Medical History  Diagnosis Date  . Depression   . Headache   . Acute bronchopneumonia 04/17/2012  . COPD (chronic obstructive pulmonary disease) 04/19/2012    Past Surgical History  Procedure Date  . Tubal ligation   . Abdominal hysterectomy     Kendell Bane RD, LDN, CNSC [phone] Pager [phone] After Hours Pager

## 2012-04-20 NOTE — Progress Notes (Signed)
eLink Physician-Brief Progress Note Patient Name: Patricia Farrell DOB: October 18, 1965 MRN: [id number]  Date of Service  04/20/2012   HPI/Events of Note   Sup needed  eICU Interventions  protonix iv   Intervention Category Intermediate Interventions: Best-practice therapies (e.g. DVT, beta blocker, etc.)  Patricia Farrell 04/20/2012, 3:44 AM

## 2012-04-21 ENCOUNTER — Inpatient Hospital Stay (HOSPITAL_COMMUNITY): Payer: MEDICAID

## 2012-04-21 LAB — CBC WITH DIFFERENTIAL/PLATELET
Basophils Relative: 1 % (ref 0–1)
Eosinophils Absolute: 0 10*3/uL (ref 0.0–0.7)
Hemoglobin: 10.3 g/dL — ABNORMAL LOW (ref 12.0–15.0)
Lymphocytes Relative: 10 % — ABNORMAL LOW (ref 12–46)
MCHC: 32.8 g/dL (ref 30.0–36.0)
Monocytes Relative: 10 % (ref 3–12)
Neutrophils Relative %: 79 % — ABNORMAL HIGH (ref 43–77)
RBC: 3.38 MIL/uL — ABNORMAL LOW (ref 3.87–5.11)
WBC: 8.8 10*3/uL (ref 4.0–10.5)

## 2012-04-21 LAB — BLOOD GAS, ARTERIAL
Acid-Base Excess: 2.3 mmol/L — ABNORMAL HIGH (ref 0.0–2.0)
Bicarbonate: 28.9 mEq/L — ABNORMAL HIGH (ref 20.0–24.0)
FIO2: 60 %
O2 Saturation: 96.7 %
RATE: 24 resp/min
pO2, Arterial: 94.4 mmHg (ref 80.0–100.0)

## 2012-04-21 LAB — COMPREHENSIVE METABOLIC PANEL
ALT: 48 U/L — ABNORMAL HIGH (ref 0–35)
BUN: 15 mg/dL (ref 6–23)
CO2: 29 mEq/L (ref 19–32)
Calcium: 7.6 mg/dL — ABNORMAL LOW (ref 8.4–10.5)
Creatinine, Ser: 0.4 mg/dL — ABNORMAL LOW (ref 0.50–1.10)
GFR calc Af Amer: >90 mL/min (ref >90)
GFR calc non Af Amer: >90 mL/min (ref >90)
Glucose, Bld: 221 mg/dL — ABNORMAL HIGH (ref 70–99)

## 2012-04-21 LAB — GLUCOSE, CAPILLARY
Glucose-Capillary: 190 mg/dL — ABNORMAL HIGH (ref 70–99)
Glucose-Capillary: 218 mg/dL — ABNORMAL HIGH (ref 70–99)

## 2012-04-21 LAB — RETICULOCYTES: Retic Count, Absolute: 24.1 10*3/uL (ref 19.0–186.0)

## 2012-04-21 LAB — IRON AND TIBC
Iron: 74 ug/dL (ref 42–135)
UIBC: 62 ug/dL — ABNORMAL LOW (ref 125–400)

## 2012-04-21 LAB — MAGNESIUM: Magnesium: 2.5 mg/dL (ref 1.5–2.5)

## 2012-04-21 LAB — BASIC METABOLIC PANEL
Calcium: 7.9 mg/dL — ABNORMAL LOW (ref 8.4–10.5)
GFR calc Af Amer: >90 mL/min (ref >90)
GFR calc non Af Amer: >90 mL/min (ref >90)
Potassium: 4 mEq/L (ref 3.5–5.1)
Sodium: 144 mEq/L (ref 135–145)

## 2012-04-21 LAB — FOLATE: Folate: 18.8 ng/mL

## 2012-04-21 LAB — PROCALCITONIN: Procalcitonin: 0.17 ng/mL

## 2012-04-21 MED ORDER — PRO-STAT SUGAR FREE PO LIQD: 60.0000 mL | Freq: Two times a day (BID) | ORAL | Status: DC

## 2012-04-21 MED ORDER — OXEPA PO LIQD
1000.0000 mL | ORAL | Status: DC
  Filled 2012-04-21: qty 1000

## 2012-04-21 MED ORDER — METHYLPREDNISOLONE SODIUM SUCC 40 MG IJ SOLR
40.0000 mg | Freq: Two times a day (BID) | INTRAMUSCULAR | Status: DC
  Administered 2012-04-21 – 2012-04-22 (×3): 40 mg via INTRAVENOUS
  Filled 2012-04-21 (×6): qty 1

## 2012-04-21 MED ORDER — OXEPA PO LIQD
1000.0000 mL | ORAL | Status: DC
  Administered 2012-04-21 – 2012-04-22 (×2): 1000 mL
  Filled 2012-04-21 (×3): qty 1000

## 2012-04-21 MED ORDER — PRO-STAT SUGAR FREE PO LIQD
60.0000 mL | Freq: Two times a day (BID) | ORAL | Status: DC
  Administered 2012-04-21 – 2012-04-23 (×4): 60 mL
  Filled 2012-04-21 (×5): qty 60

## 2012-04-21 MED ORDER — POTASSIUM CHLORIDE 20 MEQ/15ML (10%) PO LIQD
40.0000 meq | ORAL | Status: AC
  Administered 2012-04-21 (×3): 40 meq via ORAL
  Filled 2012-04-21 (×3): qty 30

## 2012-04-21 MED ORDER — INSULIN ASPART 100 UNIT/ML ~~LOC~~ SOLN
0.0000 [IU] | SUBCUTANEOUS | Status: DC
  Administered 2012-04-21: 3 [IU] via SUBCUTANEOUS
  Administered 2012-04-21 (×2): 2 [IU] via SUBCUTANEOUS
  Administered 2012-04-21 – 2012-04-22 (×2): 1 [IU] via SUBCUTANEOUS
  Administered 2012-04-22: 2 [IU] via SUBCUTANEOUS
  Administered 2012-04-22: 1 [IU] via SUBCUTANEOUS
  Administered 2012-04-22: 2 [IU] via SUBCUTANEOUS
  Administered 2012-04-22: 3 [IU] via SUBCUTANEOUS
  Administered 2012-04-23 – 2012-04-24 (×4): 1 [IU] via SUBCUTANEOUS
  Administered 2012-04-25: 2 [IU] via SUBCUTANEOUS

## 2012-04-21 MED ORDER — INSULIN ASPART 100 UNIT/ML ~~LOC~~ SOLN
3.0000 [IU] | SUBCUTANEOUS | Status: DC
  Administered 2012-04-21 – 2012-04-23 (×12): 3 [IU] via SUBCUTANEOUS

## 2012-04-21 MED ORDER — POTASSIUM PHOSPHATE DIBASIC 3 MMOLE/ML IV SOLN
20.0000 mmol | Freq: Once | INTRAVENOUS | Status: AC
  Administered 2012-04-21: 20 mmol via INTRAVENOUS
  Filled 2012-04-21: qty 6.67

## 2012-04-21 NOTE — Progress Notes (Signed)
NUTRITION FOLLOW UP  Intervention:    Increase Oxepa by 10 ml every 4 hours to goal rate of 30 ml/h with Prostat 60 ml BID to provide 1480 kcals, 105 gm protein, 565 ml free water daily.  Nutrition Dx:   Inadequate oral intake related to inability to eat as evidenced by NPO status, ongoing.  Goal:   Intake to meet >90% of estimated nutrition needs, unmet.  Monitor:   TF tolerance/adequacy, labs, weight trend, vent status.  Assessment:   Patient remains intubated on ventilator support.  MV: 8.4 Temp:Temp (24hrs), Avg:97.2 F (36.2 C), Min:96.4 F (35.8 C), Max:97.5 F (36.4 C)  Propofol is now off.  Height: Ht Readings from Last 1 Encounters:  04/19/12 5\' 3"  (1.6 m)    Weight Status:   Wt Readings from Last 1 Encounters:  04/19/12 170 lb 3.1 oz (77.2 kg)  BMI using weight on admission = 28.9 (overweight)  Re-estimated needs:  Kcal: 1450 Protein: 100-125 gm Fluid: 1.5-1.6 L  Skin: no problems noted  Diet Order: NPO; Oxepa @ 10 ml/hr with 60 ml Prostat TID providing 960 kcal, 105 grams of protein, and 188 ml of free water   Intake/Output Summary (Last 24 hours) at 04/21/12 1531 Last data filed at 04/21/12 1008  Gross per 24 hour  Intake   1913 ml  Output   2000 ml  Net    -87 ml    Last BM: 1/15   Labs:   Lab 04/21/12 0830 04/21/12 0450 04/20/12 0520 04/19/12 0630 04/17/12 1243  NA -- 139 140 135 --  K -- 3.0* 3.5 3.6 --  CL -- 104 104 101 --  CO2 -- 29 26 25  --  BUN -- 15 8 6  --  CREATININE -- 0.40* 0.35* 0.50 --  CALCIUM -- 7.6* 7.2* 8.1* --  MG 2.5 -- -- -- 1.5  PHOS 1.9* -- -- -- --  GLUCOSE -- 221* 167* 144* --    CBG (last 3)   Basename 04/21/12 1201 04/21/12 0922 04/21/12 0345  GLUCAP 218* 190* 204*    Scheduled Meds:   . antiseptic oral rinse  15 mL Mouth Rinse QID  . artificial tears  1 application Both Eyes Q8H  . ceFEPime (MAXIPIME) IV  1 g Intravenous Q12H  . chlorhexidine  15 mL Mouth Rinse BID  . enoxaparin (LOVENOX)  injection  40 mg Subcutaneous Q24H  . feeding supplement (OXEPA)  1,000 mL Per Tube Q24H  . feeding supplement  60 mL Per Tube TID  . furosemide  40 mg Intravenous Q12H  . insulin aspart  0-9 Units Subcutaneous Q4H  . insulin aspart  3 Units Subcutaneous Q4H  . levalbuterol  1.25 mg Nebulization Q4H   And  . ipratropium  0.5 mg Nebulization Q4H  . levofloxacin (LEVAQUIN) IV  750 mg Intravenous Q24H  . methylPREDNISolone (SOLU-MEDROL) injection  40 mg Intravenous Q12H  . multivitamin  5 mL Per Tube Daily  . oseltamivir  150 mg Oral BID  . pantoprazole (PROTONIX) IV  40 mg Intravenous Q24H  . potassium phosphate IVPB (mmol)  20 mmol Intravenous Once  . sodium chloride  1,000 mL Intravenous Once  . vancomycin  1,000 mg Intravenous Q12H    Continuous Infusions:   . sodium chloride 20 mL/hr at 04/20/12 2000  . cisatracurium (NIMBEX) infusion 1 mcg/kg/min (04/21/12 0900)  . fentaNYL infusion INTRAVENOUS 200 mcg/hr (04/21/12 1121)  . midazolam (VERSED) infusion Stopped (04/20/12 1345)  . propofol 25 mcg/kg/min (04/21/12 0039)  Joaquin Courts, RD, LDN, CNSC Pager# [phone] After Hours Pager# [phone]

## 2012-04-21 NOTE — Progress Notes (Signed)
Inpatient Diabetes Program Recommendations  AACE/ADA: New Consensus Statement on Inpatient Glycemic Control (2013)  Target Ranges:  Prepandial:   less than 140 mg/dL      Peak postprandial:   less than 180 mg/dL (1-2 hours)      Critically ill patients:  140 - 180 mg/dL   Reason for Visit: Results for PANDA, CROSSIN (MRN [id number]) as of 04/21/2012 14:27  Ref. Range 04/20/2012 22:48 04/21/2012 03:45 04/21/2012 09:22 04/21/2012 12:01  Glucose-Capillary Latest Range: 70-99 mg/dL 409 (H) 811 (H) 914 (H) 218 (H)  No history of diabetes noted however patient is on IV steroids. Note patient is on Oxepa tube feeds continuously.  Novolog tube feed coverage 3 units q 4 hours added today.  If CBG's remain greater than 180 mg/dL, may consider ICU Glycemic control protocol "Phase 2-IV insulin".  Will follow.

## 2012-04-21 NOTE — Progress Notes (Signed)
[facility] ADULT ICU REPLACEMENT PROTOCOL FOR AM LAB REPLACEMENT ONLY  The patient does apply for the [facility] Adult ICU Electrolyte Replacment Protocol based on the criteria listed below:   1. Is GFR >/= 50 ml/min? yes  Patient's GFR today is >90 2. Is urine output >/= 0.5 ml/kg/hr for the last 8 hours? yes Patient's UOP is 1.3 ml/kg/hr 3. Is BUN < 30 mg/dL? yes  Patient's BUN today is 15 4. Abnormal electrolyte(s):K3.3 5. Ordered repletion with:None, Resident MD on Unit replaced.  Melrose Nakayama 04/21/2012 6:03 AM

## 2012-04-21 NOTE — Progress Notes (Signed)
Name: Patricia Farrell MRN: [id number] DOB: Apr 18, 1965 LOS: 4  PCCM RESIDENT DAILY PROGRESS NOTE  History of Present Illness:  47 y.o PMH depression, h/a, ?COPD, tobacco abuse (30-40 year history and quit days prior to admission) presenting 1/11 with symptoms prior to admission of fever (Tmax 103.2 on admission), h/a chills, myalgias, loose nonbloody stools,  nonproductive cough, anorexia,  and worsening sob.  She  had exposure to sick contacts.  She had worsening sob and wob ,intubated since 1/13.   Lines / Drains: Right IJ 1/15>> Right A line 1/13>>1/14 Right A line 1/14>> ETT 1/13>> Foley  Flexiseal  Cultures: Blood culture 1/11>> Influenza, H1N1 1/11>> MRSA 1/13>> Urine 1/11>>40 K colonies multiple morphotypes Urine 1/14>> C. Diff 1/15>> Strep pneumonia + Legionella neg   Antibiotics: Tamiflu 1/14>> Vancomycin 1/14>> Levaquin 1/14>> Cefepime 1/13>>  Tests / Events: 1/11 Admitted  1/15 ABG 7.251/68.0/94.4/28.9  Overnight Events:  Large bowel movement sent for C. Diff.   Afebrile Remains on NMB  Vital Signs: Temp:  [96.4 F (35.8 C)-97.5 F (36.4 C)] 97.4 F (36.3 C) (01/15 0900) Pulse Rate:  [78-104] 92  (01/15 0812) Resp:  [16-27] 24  (01/15 0812) BP: (82-110)/(45-64) 95/59 mmHg (01/15 0812) SpO2:  [88 %-98 %] 96 % (01/15 0600) Arterial Line BP: (93-122)/(46-62) 122/61 mmHg (01/15 0600) FiO2 (%):  [50 %-100 %] 50 % (01/15 0812) I/O last 3 completed shifts: In: 5146.8 [I.V.:3388.8; Other:50; NG/GT:230; IV Piggyback:1478] Out: 3145 [Urine:3145]  Physical Examination: General: lying in bed Neuro:  Sedated, paralyzed, RASS -5, CAMM cant assess HEENT:  [place]/at, eyes closed  Neck: right neck with IJ Cardiovascular:  RRR no r/m/g Lungs:  ctab Abdomen:  Obese, soft, hyperactive bs, nd Musculoskeletal: 1+ lower ext edema Skin:  intact  Ventilator settings: Vent Mode:  [-] PRVC FiO2 (%):  [50 %-100 %] 50 % Set Rate:  [24 bmp] 24 bmp Vt Set:  [360 mL]  360 mL PEEP:  [8 cmH20-8.5 cmH20] 8 cmH20 Plateau Pressure:  [18 cmH20-20 cmH20] 19 cmH20  Results for Patricia Farrell, Patricia Farrell (MRN [id number]) as of 04/21/2012 07:31  Ref. Range 04/20/2012 12:40 04/21/2012 04:55  Sample type No range found ARTERIAL ARTERIAL  Delivery systems No range found VENTILATOR VENTILATOR  FIO2 No range found 0.60 60.00  Mode No range found PRESSURE REGULATED VOLUME CONTROL PRESSURE REGULATED VOLUME CONTROL  VT No range found 360 360  Peep/cpap No range found 8.0 5.0  pH, Arterial Latest Range: 7.350-7.450  7.339 (L) 7.251 (L)  pCO2 arterial Latest Range: 35.0-45.0 mmHg 50.3 (H) 68.0 (HH)  pO2, Arterial Latest Range: 80.0-100.0 mmHg 99.7 94.4  Bicarbonate Latest Range: 20.0-24.0 mEq/L 26.4 (H) 28.9 (H)  TCO2 Latest Range: 0-100 mmol/L 27.9 31.0  Acid-Base Excess Latest Range: 0.0-2.0 mmol/L 1.2 2.3 (H)  O2 Saturation No range found 98.5 96.7  Patient temperature No range found 98.6 98.6  Collection site No range found A-LINE A-LINE  Allens test (pass/fail) Latest Range: PASS  PASS    Labs and Imaging:   Basic Metabolic Panel:  Lab 04/21/12 0865 04/20/12 0520 04/17/12 1243  NA 139 140 --  K 3.0* 3.5 --  CL 104 104 --  CO2 29 26 --  GLUCOSE 221* 167* --  BUN 15 8 --  CREATININE 0.40* 0.35* --  CALCIUM 7.6* 7.2* --  MG -- -- 1.5  PHOS -- -- --   Liver Function Tests:  Lab 04/21/12 0450 04/20/12 0520  AST 31 71*  ALT 48* 62*  ALKPHOS 51 63  BILITOT 0.1* 0.2*  PROT 5.3* 5.1*  ALBUMIN 1.9* 2.0*   CBC:  Lab 04/21/12 0450 04/20/12 0520  WBC 8.8 10.1  NEUTROABS 6.9 8.3*  HGB 10.3* 10.6*  HCT 31.4* 30.8*  MCV 92.9 89.0  PLT 207 150   Cardiac Enzymes:  Lab 04/20/12 1115  CKTOTAL --  CKMB --  CKMBINDEX --  TROPONINI <0.30   CBG:  Lab 04/21/12 0345 04/20/12 2248  GLUCAP 204* 274*   Urine Drug Screen: Drugs of Abuse     Component Value Date/Time   LABOPIA NONE DETECTED 04/17/2012 1421   COCAINSCRNUR NONE DETECTED 04/17/2012 1421   LABBENZ  NONE DETECTED 04/17/2012 1421   AMPHETMU NONE DETECTED 04/17/2012 1421   THCU NONE DETECTED 04/17/2012 1421   LABBARB NONE DETECTED 04/17/2012 1421    Alcohol Level:  Lab 04/17/12 1733  ETH <11   Urinalysis:  Lab 04/20/12 2231 04/17/12 1421  COLORURINE YELLOW YELLOW  LABSPEC 1.010 1.026  PHURINE 5.0 6.0  GLUCOSEU NEGATIVE NEGATIVE  HGBUR NEGATIVE MODERATE*  BILIRUBINUR NEGATIVE NEGATIVE  KETONESUR NEGATIVE 40*  PROTEINUR NEGATIVE >300*  UROBILINOGEN 0.2 0.2  NITRITE NEGATIVE NEGATIVE  LEUKOCYTESUR NEGATIVE NEGATIVE   Misc. Labs:none   Lab 04/17/12 1313  LATICACIDVEN 0.94  PROCALCITON --    Assessment and Plan: PULMONARY 1/15 CXR IMPRESSION:  1. Stable support apparatus. No significant change.  2. Stable bilateral air space disease.  3. Bilateral pleural effusions and left basilar atelectasis.   ASSESSMENT: VDRF  Bronchopneumonia +S. pneumo urine Ag Severe ARDS.  PaO2/FiO2 ratio 157  PLAN:   Pending ABG On Nimbex since 1/14. Continue 24 more hours Cont. Mechanical ventilation.  Avoid dyssynchrony. ARDS protocol  Solumedrol 60q6 decreased to 40q12. Consider stop steroid in 24 hours  Prn Atrovent, Xopenex, Atrovent  CARDIOVASCULAR ASSESSMENT:  Sinus tachycardia, resolved Sepsis secondary to bronchopneumonia. No pressors   PLAN:  Monitor VS. Goal CVP 4-8 Strict i/o, Lasix 40 q12, daily wts  RENAL ASSESSMENT:   Hx of metabolic acidosis this admission HypoK likely in the setting of diarrhea  PLAN:   NS KVO K repleted  Pending Mag and Phos  GASTROINTESTINAL  ASSESSMENT:   Elevated transaminases trending down Diarrhea GI px  Nutrition  PLAN:   Pending C diff Protonix  TF (opexa, prostat)  HEMATOLOGIC ASSESSMENT:   Mild left shift on cbc Normocytic anemia DVT px   PLAN:  Pending anemia panel Lovenox   INFECTIOUS ASSESSMENT:  Bronchopneumonia +S. Pneumonia Fever, resolved   PLAN:   ICU PCT protocol Pending stool studies,  pending resp studies, pending blood cultures  Continue Cefepime, Vanc, Tamiflu, Levaquin  ENDOCRINE ASSESSMENT:   Hyperglycemic fsbs 160s-220s on TF  PLAN:   Pending HA1C SSI, added TF Novolog 3 units q4  NEUROLOGIC ASSESSMENT:   Sedated, paralyzed, RASS -5, CAMM cant assess  PLAN:   Continue to assess  On Fentanyl, Propofol off Versed  CLINICAL SUMMARY: 47 y.o with S. Pneumo, bronchpneumonia, severe ARDS sepsis on admission on Nimbex will continue 24 more hours and monitor status.  Transaminases improving, diarrhea with pending C. Diff.  Cultures to follow. Hyperglycemia added tube feed coverage.    Best practices / Disposition: -->ICU status under PCCM -->full code -->Lovenox for DVT Px -->Protonix for GI Px -->ventilator bundle -->diet TF  Annett Gula [phone] 04/21/2012, 9:39 AM  The patient is critically ill with multiple organ systems failure and requires high complexity decision making for assessment and support, frequent evaluation and titration of therapies, application of advanced monitoring  technologies and extensive interpretation of multiple databases. Critical Care Time devoted to patient care services described in this note is  35 minutes.  Care during the described time interval was provided by me and/or other providers on the critical care team.  I have reviewed this patient's available data, including medical history, events of note, physical examination and test results as part of my evaluation  ALVA,RAKESH V.

## 2012-04-22 ENCOUNTER — Inpatient Hospital Stay (HOSPITAL_COMMUNITY): Payer: MEDICAID

## 2012-04-22 LAB — CBC WITH DIFFERENTIAL/PLATELET
Basophils Absolute: 0 10*3/uL (ref 0.0–0.1)
Basophils Relative: 0 % (ref 0–1)
HCT: 32.3 % — ABNORMAL LOW (ref 36.0–46.0)
Hemoglobin: 10.4 g/dL — ABNORMAL LOW (ref 12.0–15.0)
Lymphs Abs: 0.6 10*3/uL — ABNORMAL LOW (ref 0.7–4.0)
MCV: 95.8 fL (ref 78.0–100.0)
Monocytes Relative: 8 % (ref 3–12)
Neutro Abs: 8.8 10*3/uL — ABNORMAL HIGH (ref 1.7–7.7)
RDW: 14.7 % (ref 11.5–15.5)
WBC: 10.2 10*3/uL (ref 4.0–10.5)

## 2012-04-22 LAB — POCT I-STAT 3, ART BLOOD GAS (G3+)
O2 Saturation: 93 %
TCO2: 38 mmol/L (ref 0–100)
pCO2 arterial: 77.7 mmHg (ref 35.0–45.0)
pH, Arterial: 7.264 — ABNORMAL LOW (ref 7.350–7.450)
pO2, Arterial: 79 mmHg — ABNORMAL LOW (ref 80.0–100.0)

## 2012-04-22 LAB — URINE CULTURE: Colony Count: NO GROWTH

## 2012-04-22 LAB — GLUCOSE, CAPILLARY
Glucose-Capillary: 106 mg/dL — ABNORMAL HIGH (ref 70–99)
Glucose-Capillary: 133 mg/dL — ABNORMAL HIGH (ref 70–99)
Glucose-Capillary: 160 mg/dL — ABNORMAL HIGH (ref 70–99)

## 2012-04-22 LAB — BASIC METABOLIC PANEL
BUN: 20 mg/dL (ref 6–23)
CO2: 33 mEq/L — ABNORMAL HIGH (ref 19–32)
Chloride: 106 mEq/L (ref 96–112)
Glucose, Bld: 161 mg/dL — ABNORMAL HIGH (ref 70–99)
Potassium: 4.4 mEq/L (ref 3.5–5.1)

## 2012-04-22 LAB — PHOSPHORUS: Phosphorus: 2.4 mg/dL (ref 2.3–4.6)

## 2012-04-22 LAB — PROCALCITONIN: Procalcitonin: 0.11 ng/mL

## 2012-04-22 MED ORDER — OSELTAMIVIR PHOSPHATE 6 MG/ML PO SUSR
150.0000 mg | Freq: Two times a day (BID) | ORAL | Status: DC
  Administered 2012-04-22 – 2012-04-23 (×2): 150 mg
  Filled 2012-04-22 (×3): qty 25

## 2012-04-22 MED ORDER — FUROSEMIDE 10 MG/ML IJ SOLN
40.0000 mg | Freq: Once | INTRAMUSCULAR | Status: AC
  Administered 2012-04-22: 40 mg via INTRAVENOUS

## 2012-04-22 MED ORDER — LOPERAMIDE HCL 1 MG/5ML PO LIQD
2.0000 mg | ORAL | Status: DC | PRN
  Filled 2012-04-22: qty 10

## 2012-04-22 NOTE — Progress Notes (Signed)
Name: Patricia Farrell MRN: [id number] DOB: 12/03/1965 LOS: 5  PCCM RESIDENT DAILY PROGRESS NOTE  History of Present Illness:  47 y.o PMH depression, h/a, ?COPD, tobacco abuse (30-40 year history and quit days prior to admission) presenting 1/11 with symptoms prior to admission of fever (Tmax 103.2 on admission), h/a chills, myalgias, loose nonbloody stools,  nonproductive cough, anorexia,  and worsening sob.  She  had exposure to sick contacts.  She had worsening sob and wob ,intubated since 1/13.   Lines / Drains: Right IJ 1/15>> Right A line 1/13>>1/14 Right A line 1/14>>1/15 ETT 1/13>> Foley  Flexiseal  Cultures: Blood culture 1/11>> Influenza, H1N1 1/11>>neg  MRSA 1/13>>neg Urine 1/11>>40 K colonies multiple morphotypes Urine 1/14>>no growth C. Diff 1/15>>neg Strep pneumonia + Legionella neg Resp Virus panel 1/15>>   Antibiotics: Tamiflu 1/14>> Vancomycin 1/14>> Levaquin 1/14>> Cefepime 1/13>>  Tests / Events: 1/11 Admitted  1/15 ABG 7.251/68.0/94.4/28.9  Overnight Events:  On Nimbex. No sig events afebrile  Vital Signs: Temp:  [97.8 F (36.6 C)-99 F (37.2 C)] 99 F (37.2 C) (01/16 0858) Pulse Rate:  [90-113] 104  (01/16 1113) Resp:  [7-25] 24  (01/16 1113) BP: (94-138)/(50-98) 112/66 mmHg (01/16 1113) SpO2:  [91 %-98 %] 91 % (01/16 1000) Arterial Line BP: (115-118)/(47) 115/47 mmHg (01/15 1400) FiO2 (%):  [49.6 %-50.2 %] 50 % (01/16 1115) Weight:  [166 lb 0.1 oz (75.3 kg)] 166 lb 0.1 oz (75.3 kg) (01/16 0500) I/O last 3 completed shifts: In: 4311 [I.V.:1927; Other:50; NG/GT:800; IV Piggyback:1534] Out: 5205 [Urine:4105; Stool:1100]  Physical Examination: General: lying in bed Neuro:  Sedated, paralyzed but waking up a little, RASS -4 to -5, CAMM cant assess HEENT:  [place]/at, eyes closed  Neck: right neck with IJ, left neck with JVD, ETT with min secretions Cardiovascular:  RRR no r/m/g, no pressors  Lungs:  Fine crackles b/l Abdomen:  Obese, soft,  hyperactive bs, nd Musculoskeletal: warm no cyanosis or edema Skin:  intact  Ventilator settings: Vent Mode:  [-] PRVC FiO2 (%):  [49.6 %-50.2 %] 50 % Set Rate:  [24 bmp] 24 bmp Vt Set:  [360 mL] 360 mL PEEP:  [8 cmH20] 8 cmH20 Plateau Pressure:  [16 cmH20-22 cmH20] 16 cmH20  Labs and Imaging:   Basic Metabolic Panel:  Lab 04/22/12 4098 04/21/12 1811 04/21/12 0830  NA 144 144 --  K 4.4 4.0 --  CL 106 107 --  CO2 33* 31 --  GLUCOSE 161* 185* --  BUN 20 16 --  CREATININE 0.44* 0.45* --  CALCIUM 8.1* 7.9* --  MG 2.5 -- 2.5  PHOS 2.4 -- 1.9*   Liver Function Tests:  Lab 04/21/12 0450 04/20/12 0520  AST 31 71*  ALT 48* 62*  ALKPHOS 51 63  BILITOT 0.1* 0.2*  PROT 5.3* 5.1*  ALBUMIN 1.9* 2.0*   CBC:  Lab 04/22/12 0500 04/21/12 0450  WBC 10.2 8.8  NEUTROABS 8.8* 6.9  HGB 10.4* 10.3*  HCT 32.3* 31.4*  MCV 95.8 92.9  PLT 230 207    CBG:  Lab 04/22/12 0841 04/22/12 0333 04/22/12 0047 04/21/12 2035 04/21/12 1621 04/21/12 1201  GLUCAP 133* 161* 160* 145* 186* 218*      Misc. Labs:none   Lab 04/22/12 0500 04/21/12 0918 04/17/12 1313  LATICACIDVEN -- -- 0.94  PROCALCITON 0.11 0.17 --    Assessment and Plan: PULMONARY 1/16 CXR reviewed  IMPRESSION:  1. Stable support apparatus.  2. No interval change in basilar airspace disease and effusions.   Results  for Patricia, Farrell (MRN [id number]) as of 04/22/2012 12:07  Ref. Range 04/21/2012 04:55 04/22/2012 07:04  Sample type No range found ARTERIAL ARTERIAL  Delivery systems No range found VENTILATOR   FIO2 No range found 60.00   Mode No range found PRESSURE REGULATED VOLUME CONTROL   VT No range found 360   Peep/cpap No range found 5.0   pH, Arterial Latest Range: 7.350-7.450  7.251 (L) 7.264 (L)  pCO2 arterial Latest Range: 35.0-45.0 mmHg 68.0 (HH) 77.7 (HH)  pO2, Arterial Latest Range: 80.0-100.0 mmHg 94.4 79.0 (L)  Bicarbonate Latest Range: 20.0-24.0 mEq/L 28.9 (H) 35.4 (H)  TCO2 Latest Range: 0-100  mmol/L 31.0 38  Acid-Base Excess Latest Range: 0.0-2.0 mmol/L 2.3 (H) 6.0 (H)  O2 Saturation No range found 96.7 93.0  Patient temperature No range found 98.6 97.8 F  Collection site No range found A-LINE RADIAL, ALLEN'S TEST ACCEPTABLE   ASSESSMENT: VDRF  Bronchopneumonia +S. pneumo urine Ag Severe ARDS  PLAN:   On Nimbex x 48 hours d/c today Decrease PEEP to 5 Then start SBTs Cont. Solumedrol 40q12 Prn Atrovent, Xopenex, Atrovent  CARDIOVASCULAR ASSESSMENT:  Intermittent Sinus tachycardia Sepsis secondary to bronchopneumonia. No pressors   PLAN:  Monitor VS. Goal CVP 4-8 Strict i/o, Lasix 40 mg x 1, daily wts  RENAL ASSESSMENT:    Lab 04/22/12 0500 04/21/12 1811 04/21/12 0830 04/21/12 0450 04/20/12 0520 04/19/12 0630 04/17/12 1243  NA 144 144 -- 139 140 135 --  K 4.4 4.0 -- -- -- -- --  CL 106 107 -- 104 104 101 --  CO2 33* 31 -- 29 26 25  --  GLUCOSE 161* 185* -- 221* 167* 144* --  BUN 20 16 -- 15 8 6  --  CREATININE 0.44* 0.45* -- 0.40* 0.35* 0.50 --  CALCIUM 8.1* 7.9* -- 7.6* 7.2* 8.1* --  MG 2.5 -- 2.5 -- -- -- 1.5  PHOS 2.4 -- 1.9* -- -- -- --    Electrolytes wnl  PLAN:   NS KVO  GASTROINTESTINAL ASSESSMENT:   Elevated transaminases this admission trending down Diarrhea-C diff negative  GI px  Nutrition  PLAN:   Imodium prn Protonix  TF (opexa, prostat)  HEMATOLOGIC ASSESSMENT:   Target cells on cbc Normocytic anemia, mixed etiology could be anemia of chronic disease DVT px   PLAN:  Lovenox   INFECTIOUS ASSESSMENT:  Bronchopneumonia +S. Pneumonia afebrile  Lab 04/22/12 0500 04/21/12 0918  PROCALCITON 0.11 0.17    PLAN:   ICU PCT protocol Pending cultures to follow. Resent trach aspirate for flu not enough sample 1st time Continue Cefepime, Tamiflu, Levaquin, dc  Vanc  ENDOCRINE ASSESSMENT:   Hyperglycemic fsbs 140s-200s on TF, HA1C 6.0  PLAN:   SSI, Novolog 3 units q4  NEUROLOGIC ASSESSMENT:   Sedated  PLAN:     Continue to assess  On Fentanyl, Propofol; off Versed Goal RASS 0  GLOBAL - updated family in detail  CLINICAL SUMMARY: 47 y.o with S. Pneumo, bronchpneumonia, severe ARDS sepsis on admission on Nimbex.  D/c Nimbex today   Cultures to follow (i.e tracheal aspirate, Resp virus culture) neg for influenza nasal PCR.  Wean today.   Hyperglycemia improved.   Best practices / Disposition: -->ICU status under PCCM -->full code -->Lovenox for DVT Px -->Protonix for GI Px -->ventilator bundle -->diet TF  Annett Gula [phone] 04/22/2012, 12:07 PM  Care during the described time interval was provided by me and/or other providers on the critical care team.  I have reviewed this  patient's available data, including medical history, events of note, physical examination and test results as part of my evaluation  CC time x 35 m  Khanh Cordner V.

## 2012-04-23 ENCOUNTER — Inpatient Hospital Stay (HOSPITAL_COMMUNITY): Payer: MEDICAID

## 2012-04-23 LAB — CBC WITH DIFFERENTIAL/PLATELET
Basophils Relative: 0 % (ref 0–1)
Eosinophils Relative: 0 % (ref 0–5)
HCT: 31.2 % — ABNORMAL LOW (ref 36.0–46.0)
Hemoglobin: 10.1 g/dL — ABNORMAL LOW (ref 12.0–15.0)
Lymphs Abs: 0.6 10*3/uL — ABNORMAL LOW (ref 0.7–4.0)
MCH: 30.8 pg (ref 26.0–34.0)
MCV: 95.1 fL (ref 78.0–100.0)
Monocytes Absolute: 1 10*3/uL (ref 0.1–1.0)
Monocytes Relative: 11 % (ref 3–12)
Neutro Abs: 7.3 10*3/uL (ref 1.7–7.7)
Platelets: 271 10*3/uL (ref 150–400)
RBC: 3.28 MIL/uL — ABNORMAL LOW (ref 3.87–5.11)
WBC: 8.9 10*3/uL (ref 4.0–10.5)

## 2012-04-23 LAB — RESPIRATORY VIRUS PANEL
Adenovirus: NOT DETECTED
Adenovirus: NOT DETECTED
Influenza A H1: NOT DETECTED
Influenza A H3: NOT DETECTED
Influenza A: DETECTED — AB
Influenza A: DETECTED — AB
Influenza B: NOT DETECTED
Parainfluenza 2: NOT DETECTED
Parainfluenza 3: NOT DETECTED
Respiratory Syncytial Virus A: NOT DETECTED
Respiratory Syncytial Virus B: NOT DETECTED

## 2012-04-23 LAB — CULTURE, BLOOD (ROUTINE X 2)
Culture: NO GROWTH
Culture: NO GROWTH

## 2012-04-23 LAB — GLUCOSE, CAPILLARY
Glucose-Capillary: 100 mg/dL — ABNORMAL HIGH (ref 70–99)
Glucose-Capillary: 115 mg/dL — ABNORMAL HIGH (ref 70–99)
Glucose-Capillary: 141 mg/dL — ABNORMAL HIGH (ref 70–99)

## 2012-04-23 LAB — BASIC METABOLIC PANEL
CO2: 40 mEq/L (ref 19–32)
Calcium: 8.5 mg/dL (ref 8.4–10.5)
Chloride: 104 mEq/L (ref 96–112)
Glucose, Bld: 131 mg/dL — ABNORMAL HIGH (ref 70–99)
Sodium: 147 mEq/L — ABNORMAL HIGH (ref 135–145)

## 2012-04-23 LAB — PHOSPHORUS: Phosphorus: 1.6 mg/dL — ABNORMAL LOW (ref 2.3–4.6)

## 2012-04-23 LAB — CULTURE, RESPIRATORY W GRAM STAIN

## 2012-04-23 MED ORDER — BIOTENE DRY MOUTH MT LIQD
15.0000 mL | Freq: Two times a day (BID) | OROMUCOSAL | Status: DC
  Administered 2012-04-23 – 2012-04-27 (×7): 15 mL via OROMUCOSAL

## 2012-04-23 MED ORDER — POTASSIUM PHOSPHATE DIBASIC 3 MMOLE/ML IV SOLN
40.0000 mmol | Freq: Once | INTRAVENOUS | Status: DC
  Filled 2012-04-23: qty 13.33

## 2012-04-23 MED ORDER — ONDANSETRON HCL 4 MG/2ML IJ SOLN
4.0000 mg | Freq: Three times a day (TID) | INTRAMUSCULAR | Status: DC | PRN
  Administered 2012-04-23 – 2012-04-24 (×3): 4 mg via INTRAVENOUS
  Filled 2012-04-23 (×2): qty 2

## 2012-04-23 MED ORDER — POTASSIUM PHOSPHATE DIBASIC 3 MMOLE/ML IV SOLN
40.0000 mmol | Freq: Once | INTRAVENOUS | Status: AC
  Administered 2012-04-23: 40 mmol via INTRAVENOUS
  Filled 2012-04-23: qty 13.33

## 2012-04-23 MED ORDER — K PHOS MONO-SOD PHOS DI & MONO 155-852-130 MG PO TABS
500.0000 mg | ORAL_TABLET | Freq: Once | ORAL | Status: DC
  Filled 2012-04-23: qty 2

## 2012-04-23 MED ORDER — CITALOPRAM HYDROBROMIDE 40 MG PO TABS
40.0000 mg | ORAL_TABLET | Freq: Every day | ORAL | Status: DC
  Administered 2012-04-23 – 2012-04-27 (×5): 40 mg via ORAL
  Filled 2012-04-23 (×5): qty 1

## 2012-04-23 MED ORDER — PROMETHAZINE HCL 25 MG/ML IJ SOLN
12.5000 mg | Freq: Once | INTRAMUSCULAR | Status: AC
  Administered 2012-04-23: 25 mg via INTRAVENOUS
  Filled 2012-04-23: qty 1

## 2012-04-23 MED ORDER — OSELTAMIVIR PHOSPHATE 75 MG PO CAPS
150.0000 mg | ORAL_CAPSULE | Freq: Once | ORAL | Status: AC
  Administered 2012-04-23: 150 mg via ORAL
  Filled 2012-04-23: qty 2

## 2012-04-23 MED ORDER — ALPRAZOLAM 0.5 MG PO TABS
0.5000 mg | ORAL_TABLET | Freq: Once | ORAL | Status: AC
  Administered 2012-04-23: 0.5 mg via ORAL
  Filled 2012-04-23: qty 1

## 2012-04-23 MED ORDER — CLONAZEPAM 0.5 MG PO TABS: 0.5000 mg | ORAL_TABLET | Freq: Two times a day (BID) | ORAL | Status: DC

## 2012-04-23 MED ORDER — PREDNISONE 20 MG PO TABS
40.0000 mg | ORAL_TABLET | Freq: Every day | ORAL | Status: DC
  Administered 2012-04-24 – 2012-04-26 (×3): 40 mg via ORAL
  Filled 2012-04-23 (×5): qty 2

## 2012-04-23 MED ORDER — DEXTROSE 5 % IV SOLN: INTRAVENOUS | Status: DC

## 2012-04-23 MED ORDER — METHYLPREDNISOLONE SODIUM SUCC 40 MG IJ SOLR
40.0000 mg | INTRAMUSCULAR | Status: AC
  Administered 2012-04-23: 40 mg via INTRAVENOUS
  Filled 2012-04-23: qty 1

## 2012-04-23 MED ORDER — POTASSIUM PHOSPHATE DIBASIC 3 MMOLE/ML IV SOLN: 40.0000 meq | Freq: Once | INTRAVENOUS | Status: DC

## 2012-04-23 NOTE — Evaluation (Signed)
Physical Therapy Evaluation Patient Details Name: Patricia Farrell MRN: [id number] DOB: Jun 30, 1965 Today's Date: 04/23/2012 Time: 7829-5621 PT Time Calculation (min): 33 min  PT Assessment / Plan / Recommendation Clinical Impression  Pt s/p VDRF with decr mobility secondary to decr balance and decr endurance for activity.  Limited today by nausea and pain as well.  Desat with activity and needed O2 to be turned up to 4 L from 2L as well.  Should progress to home with boyfriend with 24 hour assist and HHPT f/u.      PT Assessment  Patient needs continued PT services    Follow Up Recommendations  Home health PT;Supervision/Assistance - 24 hour                Equipment Recommendations   (TBA)         Frequency Min 3X/week    Precautions / Restrictions Precautions Precautions: None Restrictions Weight Bearing Restrictions: No   Pertinent Vitals/Pain Desat to 85% on 2L, therefore incr O2 to 4L to keep sat above 90%, Some pain      Mobility  Bed Mobility Bed Mobility: Rolling Left;Left Sidelying to Sit Rolling Left: 4: Min assist Left Sidelying to Sit: 3: Mod assist Details for Bed Mobility Assistance: Pt needed assist to get around lines Transfers Transfers: Sit to Stand;Stand to Sit;Stand Pivot Transfers Sit to Stand: 1: +2 Total assist;With upper extremity assist;From bed Sit to Stand: Patient Percentage: 70% Stand to Sit: 1: +2 Total assist;With upper extremity assist;To chair/3-in-1;With armrests Stand to Sit: Patient Percentage: 70% Stand Pivot Transfers: 1: +2 Total assist Stand Pivot Transfers: Patient Percentage: 70% Details for Transfer Assistance: Pt needed cues for hand placement with sit to stand to sit.  Able to stand and pivot to recliner with steadying assist.  Pt maintained flexed posture flexed at hips and knees as well as trunk.  Pt feeling sick and nauseous throughout.   Ambulation/Gait Ambulation/Gait Assistance: Not tested (comment) Stairs:  No Wheelchair Mobility Wheelchair Mobility: No              PT Diagnosis: Generalized weakness  PT Problem List: Decreased activity tolerance;Decreased balance;Decreased mobility;Decreased safety awareness;Decreased knowledge of use of DME;Decreased knowledge of precautions PT Treatment Interventions: DME instruction;Gait training;Functional mobility training;Therapeutic activities;Therapeutic exercise;Balance training;Patient/family education   PT Goals Acute Rehab PT Goals PT Goal Formulation: With patient Time For Goal Achievement: 04/30/12 Potential to Achieve Goals: Good Pt will go Supine/Side to Sit: Independently PT Goal: Supine/Side to Sit - Progress: Goal set today Pt will go Sit to Stand: Independently PT Goal: Sit to Stand - Progress: Goal set today Pt will Ambulate: 51 - 150 feet;with modified independence;with least restrictive assistive device PT Goal: Ambulate - Progress: Goal set today  Visit Information  Last PT Received On: 04/23/12 Assistance Needed: +2 (lines)    Subjective Data  Subjective: "I want to try and get up." Patient Stated Goal: To go home   Prior Functioning  Home Living Lives With: Significant other Available Help at Discharge: Family;Available 24 hours/day Type of Home: House Home Access: Level entry Home Layout: One level Bathroom Shower/Tub: Health visitor: Standard Home Adaptive Equipment: None Prior Function Level of Independence: Independent Able to Take Stairs?: Yes Driving: Yes Vocation: Full time employment Comments: Screenprint Communication Communication: No difficulties Dominant Hand: Right    Cognition  Overall Cognitive Status: Appears within functional limits for tasks assessed/performed Arousal/Alertness: Awake/alert Orientation Level: Appears intact for tasks assessed Behavior During Session: [facility] for tasks performed  Extremity/Trunk Assessment Right Lower Extremity Assessment RLE  ROM/Strength/Tone: WFL for tasks assessed Left Lower Extremity Assessment LLE ROM/Strength/Tone: WFL for tasks assessed Trunk Assessment Trunk Assessment: Normal   Balance Static Sitting Balance Static Sitting - Balance Support: Bilateral upper extremity supported;Feet supported Static Sitting - Level of Assistance: 4: Min assist Static Sitting - Comment/# of Minutes: Needing steadying assist to sit EOB.    End of Session PT - End of Session Equipment Utilized During Treatment: Gait belt;Oxygen Activity Tolerance: Patient limited by fatigue Patient left: in chair;with call bell/phone within reach Nurse Communication: Mobility status       Farrell,Patricia Offner 04/23/2012, 3:45 PM  [facility] [phone] (pager)

## 2012-04-23 NOTE — Procedures (Signed)
Extubation Procedure Note  Patient Details:   Name: Patricia Farrell DOB: 12/15/1965 MRN: [id number]   Airway Documentation:  Airway 7.5 mm (Active)  Secured at (cm) 21 cm 04/23/2012  7:31 AM  Measured From Lips 04/23/2012  7:31 AM  Secured Location Left 04/23/2012  7:31 AM  Secured By [employer] 04/23/2012  7:31 AM  Tube Holder Repositioned Yes 04/23/2012  7:31 AM  Cuff Pressure (cm H2O) 25 cm H2O 04/22/2012  7:07 PM  Site Condition Dry 04/23/2012  4:00 AM    Evaluation  O2 sats: stable throughout Complications: No apparent complications Patient did tolerate procedure well. Bilateral Breath Sounds: Clear Suctioning: Airway Yes  Placed on 4L [place]  Devra Dopp D 04/23/2012, 10:16 AM

## 2012-04-23 NOTE — Progress Notes (Signed)
CRITICAL VALUE ALERT  Critical value received:  CO2 40   Date of notification:  1/17  Time of notification:  0545  Critical value read back:yes  Nurse who received alert:  A. Canary Brim RN  MD notified (1st page):    Time of first page:    MD notified (2nd page):  Time of second page:  Responding MD:  Tonny Branch MD  Time MD responded:  0600

## 2012-04-23 NOTE — Progress Notes (Signed)
Name: Patricia Farrell MRN: [id number] DOB: May 21, 1965 LOS: 6  PCCM RESIDENT DAILY PROGRESS NOTE  History of Present Illness:  46 y.o PMH depression, h/a, ?COPD, tobacco abuse (30-40 year history and quit days prior to admission) presenting 1/11 with symptoms prior to admission of fever (Tmax 103.2 on admission), h/a chills, myalgias, loose nonbloody stools,  nonproductive cough, anorexia,  and worsening sob.  She  had exposure to sick contacts.  She had worsening sob and wob ,intubated since 1/13.   Lines / Drains: Right IJ 1/15>> Right A line 1/13>>1/14 Right A line 1/14>>1/15 ETT 1/13>> Foley  Flexiseal  Cultures: Blood culture 1/11>> Influenza, H1N1 1/11>>neg  MRSA 1/13>>neg Urine 1/11>>40 K colonies multiple morphotypes Urine 1/14>>no growth C. Diff 1/15>>neg Strep pneumonia + Legionella neg Resp Virus panel 1/15>>   Antibiotics: Tamiflu 1/14>> Vancomycin 1/14>> Levaquin 1/14>> Cefepime 1/13>>1/17  Tests / Events: 1/11 Admitted  1/15 ABG 7.251/68.0/94.4/28.9 I/16 weaned well with RT 2.5 hrs 10/5, 15 min 5/5   Overnight Events: none on Fentanyl 75 off propofol, RASs 1-2, CAMM neg, anxious   Vital Signs: Temp:  [98.1 F (36.7 C)-98.4 F (36.9 C)] 98.4 F (36.9 C) (01/17 1300) Pulse Rate:  [81-112] 112  (01/17 1420) Resp:  [12-25] 18  (01/17 1300) BP: (112-156)/(56-107) 148/88 mmHg (01/17 1420) SpO2:  [85 %-99 %] 85 % (01/17 1420) FiO2 (%):  [39.5 %-49.9 %] 40.1 % (01/17 1000) Weight:  [160 lb 15 oz (73 kg)] 160 lb 15 oz (73 kg) (01/17 0500) I/O last 3 completed shifts: In: 3289.5 [I.V.:1459.5; NG/GT:1330; IV Piggyback:500] Out: 6330 [Urine:5530; Stool:800]  Physical Examination: General: lying in bed, arousable Neuro:  Rass 1-2, Camm +, following commands, anxious HEENT:  Porter/at, ETT intact with mod white yellow thick secretions Neck: right neck with IJ Cardiovascular:  RRR no r/m/g, no pressors  Lungs:  ctab Abdomen:  Obese, soft, nl bs,  nd Musculoskeletal: warm no cyanosis or edema Skin:  intact  Ventilator settings: Vent Mode:  [-] PSV;CPAP FiO2 (%):  [39.5 %-49.9 %] 40.1 % Set Rate:  [24 bmp] 24 bmp Vt Set:  [360 mL] 360 mL PEEP:  [5 cmH20] 5 cmH20 Pressure Support:  [5 cmH20] 5 cmH20 Plateau Pressure:  [13 cmH20-16 cmH20] 16 cmH20  Labs and Imaging:   Basic Metabolic Panel:  Lab 04/23/12 1191 04/22/12 0500  NA 147* 144  K 3.6 4.4  CL 104 106  CO2 40* 33*  GLUCOSE 131* 161*  BUN 23 20  CREATININE 0.40* 0.44*  CALCIUM 8.5 8.1*  MG 2.4 2.5  PHOS 1.6* 2.4   Liver Function Tests:  Lab 04/21/12 0450 04/20/12 0520  AST 31 71*  ALT 48* 62*  ALKPHOS 51 63  BILITOT 0.1* 0.2*  PROT 5.3* 5.1*  ALBUMIN 1.9* 2.0*   CBC:  Lab 04/23/12 0440 04/22/12 0500  WBC 8.9 10.2  NEUTROABS 7.3 8.8*  HGB 10.1* 10.4*  HCT 31.2* 32.3*  MCV 95.1 95.8  PLT 271 230    CBG:  Lab 04/23/12 1153 04/23/12 0840 04/23/12 0346 04/23/12 0050 04/22/12 2032 04/22/12 1636  GLUCAP 132* 112* 115* 141* 133* 106*      Misc. Labs:none   Lab 04/22/12 0500 04/21/12 0918 04/17/12 1313  LATICACIDVEN -- -- 0.94  PROCALCITON 0.11 0.17 --    Assessment and Plan: PULMONARY 1/17 CXR reviewed ASSESSMENT: VDRF  Bronchopneumonia +S. pneumo urine Ag Severe ARDS Anxiety  PLAN:   SBT today, extubate this am Will consider adding Celexa later if still anxious Prn  Atrovent, Xopenex, Atrovent Solumedrol changed to 40 mg qd will change to oral soon  CARDIOVASCULAR ASSESSMENT:  Intermittent Sinus tachycardia, resolved  Sepsis secondary to bronchopneumonia. No pressors  CVP 12  PLAN:  Monitor VS. Goal CVP 4-8 Strict i/o, daily wts  RENAL ASSESSMENT:    Lab 04/23/12 0440 04/22/12 0500 04/21/12 1811 04/21/12 0830 04/21/12 0450 04/20/12 0520 04/17/12 1243  NA 147* 144 144 -- 139 140 --  K 3.6 4.4 -- -- -- -- --  CL 104 106 107 -- 104 104 --  CO2 40* 33* 31 -- 29 26 --  GLUCOSE 131* 161* 185* -- 221* 167* --  BUN 23 20 16   -- 15 8 --  CREATININE 0.40* 0.44* 0.45* -- 0.40* 0.35* --  CALCIUM 8.5 8.1* 7.9* -- 7.6* 7.2* --  MG 2.4 2.5 -- 2.5 -- -- 1.5  PHOS 1.6* 2.4 -- 1.9* -- -- --    hypoPhos 1.6 HyperNa 147  PLAN:   Encourage oral intake Trend BMET Phos repleted   GASTROINTESTINAL ASSESSMENT:   Elevated transaminases this admission trending down Diarrhea-C diff negative  GI px  Nutrition  PLAN:   Pending CMET in the am Imodium prn Protonix  Hold TF (opexa, prostat)  HEMATOLOGIC ASSESSMENT:   Atypical lymphs on cbc Normocytic anemia, mixed etiology could be anemia of chronic disease DVT px   PLAN:  Lovenox   INFECTIOUS ASSESSMENT:  Bronchopneumonia +S. Pneumonia afebrile  Lab 04/22/12 0500 04/21/12 0918  PROCALCITON 0.11 0.17    PLAN:   Pending cultures to follow D/c Cefepime continue Tamiflu, Levaquin  ENDOCRINE ASSESSMENT:   Hyperglycemic fsbs 100s-160s on TF, HA1C 6.0  PLAN:   SSI, Novolog 3 units q4  NEUROLOGIC ASSESSMENT:   Rass 1-2, CAMM +, follow commands Anxiety  PLAN:   Continue to assess MS On Fentanyl 75 Off Propofol Goal RASS 0 Will add Celexa later today possibly if still anxious  CLINICAL SUMMARY: 47 y.o with S. Pneumo, bronchpneumonia, severe ARDS sepsis on admission doing better.  Weaned and extubated today.   Cultures to follow (i.e tracheal aspirate, Resp virus culture) neg for influenza nasal PCR.  D/C cefepime continue Levaquin and Tamiflu until resp. Cultures resulted.  If neg d/c Tamiflu. PT/OT eval. Remove OGT. Advance diet as tolerated.  Will likely d/c tomorrow to teaching service.    Best practices / Disposition: -->ICU status under PCCM -->full code -->Lovenox for DVT Px -->Protonix for GI Px -->ventilator bundle, SBT now extubated -->diet hold TF  Annett Gula [phone] 04/23/2012, 3:38 PM  Care during the described time interval was provided by me and/or other providers on the critical care team.  I have reviewed this  patient's available data, including medical history, events of note, physical examination and test results as part of my evaluation  CC time x  35 m  ALVA,RAKESH V.

## 2012-04-24 LAB — BLOOD GAS, ARTERIAL
Acid-Base Excess: 14.6 mmol/L — ABNORMAL HIGH (ref 0.0–2.0)
Drawn by: 31101
O2 Content: 5 L/min
O2 Saturation: 99.2 %
Patient temperature: 98.6

## 2012-04-24 LAB — HEPATIC FUNCTION PANEL
ALT: 64 U/L — ABNORMAL HIGH (ref 0–35)
AST: 55 U/L — ABNORMAL HIGH (ref 0–37)
Albumin: 2.4 g/dL — ABNORMAL LOW (ref 3.5–5.2)
Alkaline Phosphatase: 56 U/L (ref 39–117)
Total Protein: 6 g/dL (ref 6.0–8.3)

## 2012-04-24 LAB — BASIC METABOLIC PANEL
BUN: 13 mg/dL (ref 6–23)
CO2: 37 mEq/L — ABNORMAL HIGH (ref 19–32)
Chloride: 99 mEq/L (ref 96–112)
Glucose, Bld: 106 mg/dL — ABNORMAL HIGH (ref 70–99)
Potassium: 3.9 mEq/L (ref 3.5–5.1)
Sodium: 144 mEq/L (ref 135–145)

## 2012-04-24 LAB — GLUCOSE, CAPILLARY
Glucose-Capillary: 128 mg/dL — ABNORMAL HIGH (ref 70–99)
Glucose-Capillary: 90 mg/dL (ref 70–99)

## 2012-04-24 MED ORDER — DEXMEDETOMIDINE HCL IN NACL 200 MCG/50ML IV SOLN
0.2000 ug/kg/h | INTRAVENOUS | Status: DC
  Administered 2012-04-24: 0.6 ug/kg/h via INTRAVENOUS
  Administered 2012-04-24: 0.2 ug/kg/h via INTRAVENOUS
  Administered 2012-04-25: 0.3 ug/kg/h via INTRAVENOUS
  Filled 2012-04-24 (×3): qty 50

## 2012-04-24 MED ORDER — HALOPERIDOL LACTATE 5 MG/ML IJ SOLN
1.0000 mg | INTRAMUSCULAR | Status: DC | PRN
  Administered 2012-04-24 (×2): 2 mg via INTRAVENOUS
  Filled 2012-04-24 (×2): qty 1

## 2012-04-24 MED ORDER — LORAZEPAM 2 MG/ML IJ SOLN
0.5000 mg | INTRAMUSCULAR | Status: DC | PRN
  Administered 2012-04-24 (×2): 1 mg via INTRAVENOUS
  Filled 2012-04-24 (×2): qty 1

## 2012-04-24 MED ORDER — HALOPERIDOL LACTATE 5 MG/ML IJ SOLN
INTRAMUSCULAR | Status: AC
  Administered 2012-04-24: 5 mg
  Filled 2012-04-24: qty 1

## 2012-04-24 MED ORDER — ALPRAZOLAM 0.5 MG PO TABS
0.5000 mg | ORAL_TABLET | Freq: Three times a day (TID) | ORAL | Status: DC | PRN
  Administered 2012-04-24: 0.5 mg via ORAL
  Filled 2012-04-24: qty 1

## 2012-04-24 NOTE — Progress Notes (Signed)
Transfer Note Internal Medicine Teaching Service Transfer date: 04/25/11 Admitting Attending: Dr. Meredith Pel  1st Contact: Dr. Heloise Beecham Pager:[phone] 2nd Contact: Dr. Bosie Clos Pager:[phone] ** If no return call within 15 minutes (after trying both pagers listed below), please call after hours pagers.  After 5 pm or weekends: 1st Contact: Pager: [phone] 2nd Contact: Pager: [phone]  HPI: 47 y.o PMH depression, COPD, tobacco abuse (30-40 year history and quit days prior to admission) who presented on 1/11 with sepsis with fever (Tmax 103.2 on admission), chills, myalgias, loose nonbloody stools, nonproductive cough, anorexia, and worsening shortness of breath. She had exposure to sick contacts. She had worsening shortness of breath and increased work of breathing requiring intubation on 1/13. She has been treated with Tamiflu, Vancomycin, Levaquin, and cefepime. Per virus panel, she is influenza A positive and her urine Strep pneumoniae is positive. She was extubated on 1/17 and has maintained adequate oxygen saturation mostly in the upper 90s today on 4L O2 [place]. She is being transferred today back to the IMTS.   Vital signs in last 24 hours: Filed Vitals:   04/24/12 1500 04/24/12 1600 04/24/12 1608 04/24/12 1659  BP: 121/76 138/87    Pulse: 87 100    Temp:   98 F (36.7 C)   TempSrc:   Oral   Resp: 21 23    Height:      Weight:      SpO2: 98% 93%  97%   Weight change: -7 lb 4.4 oz (-3.3 kg)  Intake/Output Summary (Last 24 hours) at 04/24/12 1806 Last data filed at 04/24/12 1600  Gross per 24 hour  Intake    750 ml  Output   1850 ml  Net  -1100 ml   Vitals reviewed. General: Sitting in bed, her son at her bedside HEENT: PERRL, no scleral icterus. Dry MM, Right neck IJ site covered with gauze that is c/d/i Cardiac: tachycardia, no rubs, murmurs or gallops Pulm: Diminished breath sounds at the bases bliaterally, no wheezes, rales, or rhonchi Abd: Soft, nontender, nondistended,  normal BS present Ext: warm and well perfused, no pedal edema Neuro: alert following some commands, anxious appearing, moves 4 extremities spontaneously  Lab Results: Basic Metabolic Panel:  Lab 04/24/12 4540 04/23/12 0440  NA 144 147*  K 3.9 3.6  CL 99 104  CO2 37* 40*  GLUCOSE 106* 131*  BUN 13 23  CREATININE 0.35* 0.40*  CALCIUM 8.7 8.5  MG 2.3 2.4  PHOS 2.0* 1.6*   Liver Function Tests:  Lab 04/24/12 0540 04/21/12 0450  AST 55* 31  ALT 64* 48*  ALKPHOS 56 51  BILITOT 0.6 0.1*  PROT 6.0 5.3*  ALBUMIN 2.4* 1.9*   CBC:  Lab 04/23/12 0440 04/22/12 0500  WBC 8.9 10.2  NEUTROABS 7.3 8.8*  HGB 10.1* 10.4*  HCT 31.2* 32.3*  MCV 95.1 95.8  PLT 271 230   Cardiac Enzymes:  Lab 04/20/12 1115  CKTOTAL --  CKMB --  CKMBINDEX --  TROPONINI <0.30   CBG:  Lab 04/24/12 1559 04/24/12 1148 04/24/12 0740 04/24/12 0340 04/24/12 0047 04/23/12 2040  GLUCAP 113* 113* 91 145* 128* 100*   Hemoglobin A1C:  Lab 04/21/12 0830  HGBA1C 6.0*   Anemia Panel:  Lab 04/21/12 0830  VITAMINB12 1378*  FOLATE 18.8  FERRITIN 1631*  TIBC 136*  IRON 74  RETICCTPCT 0.7   Urine Drug Screen: Drugs of Abuse     Component Value Date/Time   LABOPIA NONE DETECTED 04/17/2012 1421   COCAINSCRNUR NONE  DETECTED 04/17/2012 1421   LABBENZ NONE DETECTED 04/17/2012 1421   AMPHETMU NONE DETECTED 04/17/2012 1421   THCU NONE DETECTED 04/17/2012 1421   LABBARB NONE DETECTED 04/17/2012 1421    Urinalysis:  Lab 04/20/12 2231  COLORURINE YELLOW  LABSPEC 1.010  PHURINE 5.0  GLUCOSEU NEGATIVE  HGBUR NEGATIVE  BILIRUBINUR NEGATIVE  KETONESUR NEGATIVE  PROTEINUR NEGATIVE  UROBILINOGEN 0.2  NITRITE NEGATIVE  LEUKOCYTESUR NEGATIVE    Micro Results: Recent Results (from the past 240 hour(s))  CULTURE, BLOOD (ROUTINE X 2)     Status: Normal   Collection Time   04/17/12 12:50 PM      Component Value Range Status Comment   Specimen Description BLOOD RIGHT ARM   Final    Special Requests BOTTLES  DRAWN AEROBIC AND ANAEROBIC 10CC   Final    Culture  Setup Time 04/17/2012 18:03   Final    Culture NO GROWTH 5 DAYS   Final    Report Status 04/23/2012 FINAL   Final   CULTURE, BLOOD (ROUTINE X 2)     Status: Normal   Collection Time   04/17/12 12:55 PM      Component Value Range Status Comment   Specimen Description BLOOD RIGHT HAND   Final    Special Requests BOTTLES DRAWN AEROBIC ONLY 4CC   Final    Culture  Setup Time 04/17/2012 18:03   Final    Culture NO GROWTH 5 DAYS   Final    Report Status 04/23/2012 FINAL   Final   URINE CULTURE     Status: Normal   Collection Time   04/17/12  2:21 PM      Component Value Range Status Comment   Specimen Description URINE, CLEAN CATCH   Final    Special Requests Normal   Final    Culture  Setup Time 04/17/2012 20:18   Final    Colony Count 40,000 COLONIES/ML   Final    Culture     Final    Value: Multiple bacterial morphotypes present, none predominant. Suggest appropriate recollection if clinically indicated.   Report Status 04/18/2012 FINAL   Final   MRSA PCR SCREENING     Status: Normal   Collection Time   04/19/12 10:27 AM      Component Value Range Status Comment   MRSA by PCR NEGATIVE  NEGATIVE Final   URINE CULTURE     Status: Normal   Collection Time   04/20/12 10:31 PM      Component Value Range Status Comment   Specimen Description URINE, CATHETERIZED   Final    Special Requests NONE   Final    Culture  Setup Time 04/21/2012 06:33   Final    Colony Count NO GROWTH   Final    Culture NO GROWTH   Final    Report Status 04/22/2012 FINAL   Final   CLOSTRIDIUM DIFFICILE BY PCR     Status: Normal   Collection Time   04/21/12  1:41 AM      Component Value Range Status Comment   C difficile by pcr NEGATIVE  NEGATIVE Final   CULTURE, RESPIRATORY     Status: Normal   Collection Time   04/21/12  9:11 AM      Component Value Range Status Comment   Specimen Description TRACHEAL ASPIRATE   Final    Special Requests NONE   Final     Gram Stain     Final    Value: FEW WBC  PRESENT, PREDOMINANTLY MONONUCLEAR     FEW SQUAMOUS EPITHELIAL CELLS PRESENT     NO ORGANISMS SEEN   Culture     Final    Value: MODERATE YEAST CONSISTENT WITH CANDIDA SPECIES     FEW CANDIDA ALBICANS   Report Status 04/23/2012 FINAL   Final   RESPIRATORY VIRUS PANEL     Status: Abnormal   Collection Time   04/21/12  5:56 PM      Component Value Range Status Comment   Source - [facility] NASAL SWAB   Corrected CORRECTED ON 01/17 AT 1112: PREVIOUSLY REPORTED AS NASAL SWAB   Respiratory Syncytial Virus A NOT DETECTED   Final    Respiratory Syncytial Virus B NOT DETECTED   Final    Influenza A DETECTED (*)  Final    Influenza B NOT DETECTED   Final    Parainfluenza 1 NOT DETECTED   Final    Parainfluenza 2 NOT DETECTED   Final    Parainfluenza 3 NOT DETECTED   Final    Metapneumovirus NOT DETECTED   Final    Rhinovirus NOT DETECTED   Final    Adenovirus NOT DETECTED   Final    Influenza A H1 NOT DETECTED   Final    Influenza A H3 NOT DETECTED   Final   CULTURE, RESPIRATORY     Status: Normal (Preliminary result)   Collection Time   04/22/12  6:18 PM      Component Value Range Status Comment   Specimen Description TRACHEAL ASPIRATE   Final    Special Requests NONE   Final    Gram Stain     Final    Value: RARE WBC PRESENT, PREDOMINANTLY PMN     NO ORGANISMS SEEN   Culture FEW CANDIDA ALBICANS   Final    Report Status PENDING   Incomplete   RESPIRATORY VIRUS PANEL     Status: Abnormal   Collection Time   04/22/12  6:36 PM      Component Value Range Status Comment   Source - [facility] TRACHEAL ASPIRATE   Final    Respiratory Syncytial Virus A NOT DETECTED   Final    Respiratory Syncytial Virus B NOT DETECTED   Final    Influenza A DETECTED (*)  Final    Influenza B NOT DETECTED   Final    Parainfluenza 1 NOT DETECTED   Final    Parainfluenza 2 NOT DETECTED   Final    Parainfluenza 3 NOT DETECTED   Final    Metapneumovirus NOT DETECTED   Final     Rhinovirus NOT DETECTED   Final    Adenovirus NOT DETECTED   Final    Influenza A H1 NOT DETECTED   Final    Influenza A H3 NOT DETECTED   Final    Studies/Results: Dg Chest Port 1 View  04/23/2012  *RADIOLOGY REPORT*  Clinical Data: Evaluate ET tube placement  PORTABLE CHEST - 1 VIEW  Comparison: 04/22/2012  Findings: ET tube tip is above the carina.  There is a right IJ catheter with tip in the projection the SVC.  Nasogastric tube is in place.  Lung volumes are low.  There is persistent bilateral lower lobe airspace opacities.  This is not significantly changed from previous exam.  IMPRESSION:  1.  No change in bibasilar airspace opacities.   Original Report Authenticated By: Signa Kell, M.D.    Medications: I have reviewed the patient's current medications. Scheduled Meds:   .  antiseptic oral rinse  15 mL Mouth Rinse BID  . citalopram  40 mg Oral Daily  . insulin aspart  0-9 Units Subcutaneous Q4H  . levalbuterol  1.25 mg Nebulization Q4H   And  . ipratropium  0.5 mg Nebulization Q4H  . levofloxacin (LEVAQUIN) IV  750 mg Intravenous Q24H  . multivitamin  5 mL Per Tube Daily  . pantoprazole (PROTONIX) IV  40 mg Intravenous Q24H  . predniSONE  40 mg Oral Q breakfast   Continuous Infusions:   . sodium chloride 20 mL/hr at 04/20/12 2000   PRN Meds:.acetaminophen, ALPRAZolam, haloperidol lactate, ipratropium, levalbuterol, loperamide, ondansetron, ondansetron Assessment/Plan: CAP. She is now s/p extubation on 1/17, Strep pneumoniae antigen positive, influenza A positive, severe ARDS. Vancomycin and Cefepime discontinued on 1/17.  -Continue Atrovent and Xopenex PRN -taper steroids, currently on Prednisone 40mg  daily -continue Tamiflu and Levaquin (levaquin for 10 days, was started on 1/14) -f/u on blood cultures (NGTD) -f/u sputum/repiratory culture (with few Candida Albicans but final report pending) -PT/OT eval  Tachycardia. Intermittent sinus tachycardia, likely secondary to  CAP, recent extubation.  -Continue monitoring, would consider telemetry  Diarrhea.  Stool C.diff PCR negative.  -Continue Imodium PRN  -Continue Protonix, would transition to PO tomorrow as pt maintains adequate intake per mouth  Hyperglycemia. CBGs in the 100-160s, on steroids. Hgb A1C of 6.0, prediabetes range.  -SSI sensitive   Anxiety. Possible delirium as well, likely worsened by steroids use.  -Continue Celexa -Haldol PRN -Xanax .5mg  PRN (would consider discontinuing)  DVT prophylaxis: Lovenox FEN: NS 10-66ml           Replete Electrolytes as needed, Mg and Phos pending           Clear liquid diet, would consider transitioning to regular tomorrow  Code status: Full code  Dispo: Disposition is deferred at this time, awaiting improvement of current medical problems.  Anticipated discharge in approximately 1-2 day(s).   The patient does not have a current PCP , therefore will be requiring OPC follow-up after discharge.   The patient does not have transportation limitations that hinder transportation to clinic appointments.  .Services Needed at time of discharge: Y = Yes, Blank = No PT:   OT:   RN:   Equipment:   Other:     LOS: 7 days   Sara Chu D 04/24/2012, 6:06 PM

## 2012-04-24 NOTE — Progress Notes (Signed)
Remains agitated   Precedex for delirium

## 2012-04-24 NOTE — Progress Notes (Signed)
Name: Patricia Farrell MRN: [id number] DOB: [birth year] LOS: 7  PCCM RESIDENT DAILY PROGRESS NOTE  History of Present Illness:  47 y.o PMH depression, h/a, ?COPD, tobacco abuse (30-40 year history and quit days prior to admission) presenting 1/11 with symptoms prior to admission of fever (Tmax 103.2 on admission), h/a chills, myalgias, loose nonbloody stools,  nonproductive cough, anorexia,  and worsening sob.  She  had exposure to sick contacts.  She had worsening sob and wob ,intubated since 1/13.   Lines / Drains: Right IJ 1/15>> Right A line 1/13>>1/14 Right A line 1/14>>1/15 ETT 1/13>>1/17 Foley  Flexiseal  Cultures: Blood culture 1/11>>NEG  Influenza, H1N1 1/11>>neg  MRSA 1/13>>neg Urine 1/11>>40 K colonies multiple morphotypes Urine 1/14>>no growth C. Diff 1/15>>neg Strep pneumonia + Legionella neg Resp Virus panel 1/15>>influenza A + Sputum 1/16 >   Antibiotics: Tamiflu 1/14>> Vancomycin 1/14>>1/17  Levaquin 1/14>> Cefepime 1/13>>1/17  Tests / Events: 1/11 Admitted  1/15 ABG 7.251/68.0/94.4/28.9 I/16 weaned well with RT 2.5 hrs 10/5, 15 min 5/5  1/17 extubated   Overnight Events:  Extubated 1/17 , adequate sats,  Decreased agitation, following commands    Vital Signs: Temp:  [97.1 F (36.2 C)-98.6 F (37 C)] 97.1 F (36.2 C) (01/18 1220) Pulse Rate:  [85-112] 98  (01/18 0915) Resp:  [13-26] 17  (01/18 0915) BP: (137-152)/(58-127) 142/127 mmHg (01/18 0900) SpO2:  [85 %-99 %] 95 % (01/18 0916) Weight:  [69.7 kg (153 lb 10.6 oz)] 69.7 kg (153 lb 10.6 oz) (01/18 0500) I/O last 3 completed shifts: In: 1961.7 [I.V.:857.7; NG/GT:560; IV Piggyback:544] Out: 3355 [Urine:2755; Stool:600]  Physical Examination: General: NAD  Neuro:  +following commands, anxious HEENT:  Dry mucosa  Neck: right neck with IJ Cardiovascular:  RRR no r/m/g, no pressors  Lungs:  Diminished bs in bases  Abdomen:  Obese, soft, nl bs, nd Musculoskeletal: warm no cyanosis or  edema Skin:  intact  Ventilator settings:    Labs and Imaging:   Basic Metabolic Panel:  Lab 04/24/12 4098 04/23/12 0440  NA 144 147*  K 3.9 3.6  CL 99 104  CO2 37* 40*  GLUCOSE 106* 131*  BUN 13 23  CREATININE 0.35* 0.40*  CALCIUM 8.7 8.5  MG 2.3 2.4  PHOS 2.0* 1.6*   Liver Function Tests:  Lab 04/24/12 0540 04/21/12 0450  AST 55* 31  ALT 64* 48*  ALKPHOS 56 51  BILITOT 0.6 0.1*  PROT 6.0 5.3*  ALBUMIN 2.4* 1.9*   CBC:  Lab 04/23/12 0440 04/22/12 0500  WBC 8.9 10.2  NEUTROABS 7.3 8.8*  HGB 10.1* 10.4*  HCT 31.2* 32.3*  MCV 95.1 95.8  PLT 271 230    CBG:  Lab 04/24/12 1148 04/24/12 0740 04/24/12 0340 04/24/12 0047 04/23/12 2040 04/23/12 1702  GLUCAP 113* 91 145* 128* 100* 103*      Misc. Labs:none   Lab 04/22/12 0500 04/21/12 0918 04/17/12 1313  LATICACIDVEN -- -- 0.94  PROCALCITON 0.11 0.17 --    Assessment and Plan: PULMONARY 1/17 CXR reviewed ASSESSMENT: VDRF -extubated 1/17  Bronchopneumonia +S. pneumo urine Ag Severe ARDS Anxiety Influ A +  PLAN:   Prn Atrovent, Xopenex, Atrovent Taper steroids   CARDIOVASCULAR ASSESSMENT:  Intermittent Sinus tachycardia, resolved  Sepsis secondary to bronchopneumonia. No pressors   PLAN:  Monitor VS.   Strict i/o, daily wts  RENAL ASSESSMENT:    Lab 04/24/12 0540 04/23/12 0440 04/22/12 0500 04/21/12 1811 04/21/12 0830 04/21/12 0450 04/17/12 1243  NA 144 147* 144 144 --  139 --  K 3.9 3.6 -- -- -- -- --  CL 99 104 106 107 -- 104 --  CO2 37* 40* 33* 31 -- 29 --  GLUCOSE 106* 131* 161* 185* -- 221* --  BUN 13 23 20 16  -- 15 --  CREATININE 0.35* 0.40* 0.44* 0.45* -- 0.40* --  CALCIUM 8.7 8.5 8.1* 7.9* -- 7.6* --  MG 2.3 2.4 2.5 -- 2.5 -- 1.5  PHOS 2.0* 1.6* 2.4 -- 1.9* -- --    hypoPhos 1.6 HyperNa 147  PLAN:   Encourage oral intake Trend BMET    GASTROINTESTINAL ASSESSMENT:   Elevated transaminases this admission trending down Diarrhea-C diff negative  GI px   Nutrition  PLAN:    Imodium prn Protonix     HEMATOLOGIC ASSESSMENT:   Atypical lymphs on cbc Normocytic anemia, mixed etiology could be anemia of chronic disease DVT px   PLAN:  Lovenox   INFECTIOUS ASSESSMENT:  Bronchopneumonia +S. Pneumonia +Influenza A   Lab 04/22/12 0500 04/21/12 0918  PROCALCITON 0.11 0.17    PLAN:   Pending cultures to follow   continue Tamiflu, Levaquin x 10ds  ENDOCRINE ASSESSMENT:   Hyperglycemic fsbs 100s-160s on TF, HA1C 6.0  PLAN:   SSI, Novolog 3 units q4  NEUROLOGIC ASSESSMENT:   Delerium/ pre-morbid Anxiety -suspect steroids aggravating   PLAN:    Celexa  Haldol As needed  -Try to avoid benzos here , xanax if at all needed  CLINICAL SUMMARY: 47 y.o with S. Pneumo, bronchpneumonia, severe ARDS sepsis on admission doing better.   Extubated 1/17 Cultures to follow  Influenza A + , continue Levaquin and Tamiflu  PT/OT eval.  . Advance diet as tolerated.  Will transfer to SDU  to teaching service.    Best practices / Disposition: -->ICU status under PCCM -->full code -->Lovenox for DVT Px -->Protonix for GI Px -->diet   PARRETT,TAMMY NP -C  04/24/2012, 12:30 PM  Vineta Carone V.  [phone]

## 2012-04-24 NOTE — Clinical Social Work Note (Signed)
Clinical Social Work Department BRIEF PSYCHOSOCIAL ASSESSMENT 04/24/2012  Patient:  Patricia Farrell, Patricia Farrell     Account Number:  [id number]     Admit date:  04/17/2012  Clinical Social Worker:  Johnsie Cancel  Date/Time:  04/24/2012 01:47 PM  Referred by:  RN  Date Referred:  04/24/2012 Referred for  Other - See comment   Other Referral:   SNF vs HH   Interview type:  Family Other interview type:    PSYCHOSOCIAL DATA Living Status:  SIGNIFICANT OTHER Primary support name:  Morrie Sheldon [phone]) Primary support relationship to patient:  CHILD, ADULT Degree of support available:   Adequate. Completed psychosocial with CSW.    CURRENT CONCERNS Current Concerns  Post-Acute Placement   Other Concerns:    SOCIAL WORK ASSESSMENT / PLAN CSW consulted re: SNF vs. HH needs. CSW completed chart review and per notes, patient has an altered mental status. CSw contacted patient's daughter, Morrie Sheldon [phone]) who stated the patient will be going to either her home or her boyfriend's home. Per Morrie Sheldon the boyfriend will be able to provide 24/7 supervision. Morrie Sheldon refused SNF and requested RNCM to assist in facilitating [facility] needs. CSW signing off as no other CSW needs are present. Please re-consult as needed.   Assessment/plan status:  Information/Referral to [facility] Other assessment/ plan:   Information/referral to community resources:    PATIENT'S/FAMILY'S RESPONSE TO PLAN OF CARE: Patient's daughter thanked CSW for providing support and assisting in D/C.   Lia Foyer, LCSWA [facility] Clinical Social Worker Contact #: [phone] (weekend)

## 2012-04-25 LAB — GLUCOSE, CAPILLARY
Glucose-Capillary: 108 mg/dL — ABNORMAL HIGH (ref 70–99)
Glucose-Capillary: 118 mg/dL — ABNORMAL HIGH (ref 70–99)
Glucose-Capillary: 89 mg/dL (ref 70–99)
Glucose-Capillary: 99 mg/dL (ref 70–99)

## 2012-04-25 LAB — BASIC METABOLIC PANEL
CO2: 35 mEq/L — ABNORMAL HIGH (ref 19–32)
Chloride: 99 mEq/L (ref 96–112)
Creatinine, Ser: 0.45 mg/dL — ABNORMAL LOW (ref 0.50–1.10)
GFR calc Af Amer: >90 mL/min (ref >90)
Potassium: 3.8 mEq/L (ref 3.5–5.1)
Sodium: 140 mEq/L (ref 135–145)

## 2012-04-25 LAB — CULTURE, RESPIRATORY W GRAM STAIN

## 2012-04-25 LAB — PHOSPHORUS: Phosphorus: 2.7 mg/dL (ref 2.3–4.6)

## 2012-04-25 LAB — MAGNESIUM: Magnesium: 2.2 mg/dL (ref 1.5–2.5)

## 2012-04-25 MED ORDER — ALPRAZOLAM 0.5 MG PO TABS
0.5000 mg | ORAL_TABLET | Freq: Once | ORAL | Status: AC
  Administered 2012-04-25: 0.5 mg via ORAL
  Filled 2012-04-25: qty 1

## 2012-04-25 MED ORDER — INSULIN ASPART 100 UNIT/ML ~~LOC~~ SOLN: 0.0000 [IU] | Freq: Every day | SUBCUTANEOUS | Status: DC

## 2012-04-25 MED ORDER — ENOXAPARIN SODIUM 40 MG/0.4ML ~~LOC~~ SOLN
40.0000 mg | SUBCUTANEOUS | Status: DC
  Administered 2012-04-25 – 2012-04-27 (×3): 40 mg via SUBCUTANEOUS
  Filled 2012-04-25 (×3): qty 0.4

## 2012-04-25 MED ORDER — OSELTAMIVIR PHOSPHATE 75 MG PO CAPS
150.0000 mg | ORAL_CAPSULE | Freq: Two times a day (BID) | ORAL | Status: DC
  Administered 2012-04-25 – 2012-04-27 (×5): 150 mg via ORAL
  Filled 2012-04-25 (×7): qty 2

## 2012-04-25 MED ORDER — IPRATROPIUM BROMIDE 0.02 % IN SOLN
0.5000 mg | Freq: Four times a day (QID) | RESPIRATORY_TRACT | Status: DC
  Administered 2012-04-25 – 2012-04-27 (×7): 0.5 mg via RESPIRATORY_TRACT
  Filled 2012-04-25 (×8): qty 2.5

## 2012-04-25 MED ORDER — INSULIN ASPART 100 UNIT/ML ~~LOC~~ SOLN
0.0000 [IU] | Freq: Three times a day (TID) | SUBCUTANEOUS | Status: DC
  Administered 2012-04-26: 2 [IU] via SUBCUTANEOUS
  Administered 2012-04-26 (×2): 1 [IU] via SUBCUTANEOUS

## 2012-04-25 MED ORDER — INSULIN ASPART 100 UNIT/ML ~~LOC~~ SOLN: 0.0000 [IU] | SUBCUTANEOUS | Status: DC

## 2012-04-25 MED ORDER — LEVALBUTEROL HCL 1.25 MG/0.5ML IN NEBU
1.2500 mg | INHALATION_SOLUTION | Freq: Four times a day (QID) | RESPIRATORY_TRACT | Status: DC
  Administered 2012-04-26 – 2012-04-27 (×6): 1.25 mg via RESPIRATORY_TRACT
  Filled 2012-04-25 (×11): qty 0.5

## 2012-04-25 MED ORDER — HALOPERIDOL LACTATE 5 MG/ML IJ SOLN
2.0000 mg | INTRAMUSCULAR | Status: DC | PRN
  Filled 2012-04-25: qty 0.8

## 2012-04-25 NOTE — Progress Notes (Signed)
Report given to RN [phone]. VSs family aware of transfer. Patient belongings with patient.

## 2012-04-25 NOTE — Progress Notes (Addendum)
Name: Patricia Farrell MRN: [id number] DOB: 1965/04/29 LOS: 8  PCCM RESIDENT DAILY PROGRESS NOTE  History of Present Illness:  47 y.o PMH depression, h/a, ?COPD, tobacco abuse (30-40 year history and quit days prior to admission) presenting 1/11 with symptoms prior to admission of fever (Tmax 103.2 on admission), h/a chills, myalgias, loose nonbloody stools,  nonproductive cough, anorexia,  and worsening sob.  She  had exposure to sick contacts.  She had worsening sob and wob ,intubated since 1/13.   Lines / Drains: Right IJ 1/15>> Right A line 1/13>>1/14 Right A line 1/14>>1/15 ETT 1/13>>1/17 Foley>> d/c'd Flexiseal >> d/c'd  Cultures: Blood culture 1/11>>NEG  Influenza, H1N1 1/11>>neg  MRSA 1/13>>neg Urine 1/11>>40 K colonies multiple morphotypes Urine 1/14>>no growth C. Diff 1/15>>neg Strep pneumonia + Legionella neg Resp Virus panel 1/15>>influenza A + Sputum 1/16 >   Antibiotics: Tamiflu 1/14>> 1/17 Vancomycin 1/14>>1/17  Levaquin 1/14>> Cefepime 1/13>>1/17  Tests / Events: 1/11 Admitted  1/15 ABG 7.251/68.0/94.4/28.9 I/16 weaned well with RT 2.5 hrs 10/5, 15 min 5/5  1/17 extubated    Overnight Events:  Increased aggitiation over night, Precidenx started, now weaned to lowest dose and pt doing well. Pt voided once early in the evening, unable to void again despite the urge, I&O cath performed  Vital Signs: Temp:  [97.1 F (36.2 C)-98.1 F (36.7 C)] 97.8 F (36.6 C) (01/19 0430) Pulse Rate:  [85-123] 85  (01/19 0400) Resp:  [13-25] 16  (01/19 0400) BP: (97-159)/(63-127) 132/83 mmHg (01/19 0400) SpO2:  [90 %-100 %] 100 % (01/19 0400) I/O last 3 completed shifts: In: 1766.8 [P.O.:470; I.V.:682.8; NG/GT:120; IV Piggyback:494] Out: 3130 [Urine:2930; Stool:200]  Physical Examination: General: NAD  Neuro:  +following commands, relaxed in the bed HEENT:  NCAT, MMM Neck: right neck with IJ Cardiovascular:  RRR no r/m/g, no pressors  Lungs:  Diminished bs  in bases  Abdomen:  Obese, soft, nl bs, nd Musculoskeletal: warm no cyanosis or edema Skin:  intact  Ventilator settings:   Labs and Imaging:   Basic Metabolic Panel:  Lab 04/25/12 4098 04/24/12 0540  NA 140 144  K 3.8 3.9  CL 99 99  CO2 35* 37*  GLUCOSE 119* 106*  BUN 17 13  CREATININE 0.45* 0.35*  CALCIUM 8.4 8.7  MG 2.2 2.3  PHOS 2.7 2.0*   Liver Function Tests:  Lab 04/24/12 0540 04/21/12 0450  AST 55* 31  ALT 64* 48*  ALKPHOS 56 51  BILITOT 0.6 0.1*  PROT 6.0 5.3*  ALBUMIN 2.4* 1.9*   CBC:  Lab 04/23/12 0440 04/22/12 0500  WBC 8.9 10.2  NEUTROABS 7.3 8.8*  HGB 10.1* 10.4*  HCT 31.2* 32.3*  MCV 95.1 95.8  PLT 271 230    CBG:  Lab 04/25/12 0411 04/25/12 0031 04/24/12 1956 04/24/12 1559 04/24/12 1148 04/24/12 0740  GLUCAP 108* 118* 90 113* 113* 91      Misc. Labs:none   Lab 04/22/12 0500 04/21/12 1191  LATICACIDVEN -- --  PROCALCITON 0.11 0.17    Assessment and Plan: PULMONARY 1/17 CXR reviewed ASSESSMENT: VDRF -extubated 1/17  Bronchopneumonia +S. pneumo urine Ag Severe ARDS Anxiety Influ A +  PLAN:   PRN Atrovent, Xopenex, Atrovent Taper steroids over the next week Outpt FU with pulm  CARDIOVASCULAR ASSESSMENT:  Intermittent Sinus tachycardia, resolved  Sepsis secondary to bronchopneumonia. No pressors   PLAN:  Monitor VS.   Strict i/o, daily wts  RENAL ASSESSMENT:    Lab 04/25/12 0444 04/24/12 0540 04/23/12 0440 04/22/12 0500  04/21/12 1811 04/21/12 0830  NA 140 144 147* 144 144 --  K 3.8 3.9 -- -- -- --  CL 99 99 104 106 107 --  CO2 35* 37* 40* 33* 31 --  GLUCOSE 119* 106* 131* 161* 185* --  BUN 17 13 23 20 16  --  CREATININE 0.45* 0.35* 0.40* 0.44* 0.45* --  CALCIUM 8.4 8.7 8.5 8.1* 7.9* --  MG 2.2 2.3 2.4 2.5 -- 2.5  PHOS 2.7 2.0* 1.6* 2.4 -- 1.9*    HypoPhos and hyperNa, resolved Void x1 since last night  PLAN:   I&O cath Encourage oral intake Trend BMET   GASTROINTESTINAL ASSESSMENT:   Elevated  transaminases this admission, stable Diarrhea-C diff negative  GI px  Nutrition  PLAN:   Imodium prn D/c protonix, now extubated Clear liquid diet   HEMATOLOGIC ASSESSMENT:   Atypical lymphs on cbc Normocytic anemia, mixed etiology could be anemia of chronic disease DVT px   PLAN:  SCDs   INFECTIOUS ASSESSMENT:  Bronchopneumonia +S. Pneumonia +Influenza A   Lab 04/22/12 0500 04/21/12 0918  PROCALCITON 0.11 0.17   PLAN:   Pending cultures to follow Continue Levaquin x 10ds(6/10) Restart  Tamiflu (5/10d)  ENDOCRINE ASSESSMENT:   Hyperglycemic fsbs 100s-160s on TF, HA1C 6.0, off TF CBGs WNL  PLAN:   SSI  NEUROLOGIC ASSESSMENT:   Delerium/ pre-morbid Anxiety -suspect steroids aggravating  Precidex started overnight 2/2 agitation, weaning off   PLAN:   Celexa  D/c Precidex Haldol PRN agitaiton  CLINICAL SUMMARY: 47 y.o with S. Pneumo, bronchpneumonia, severe ARDS sepsis on admission doing better.   Extubated 1/17 Cultures to follow  Influenza A + , continue Levaquin and Tamiflu  PT/OT eval.  . Advance diet as tolerated.  Transfered to tele to IM teaching service.    Best practices / Disposition: -->ICU status under PCCM -->full code -->SCDs and Lovenox for DVT Px -->Protonix for GI Px -->diet   Genelle Gather, MD Internal Medicine Resident, PGY I [facility] Internal Medicine Program Pager: [phone]  Care during the described time interval was provided by me and/or other providers on the critical care team.  I have reviewed this patient's available data, including medical history, events of note, physical examination and test results as part of my evaluation  ALVA,RAKESH V.  04/25/2012 7:13 AM

## 2012-04-25 NOTE — Progress Notes (Signed)
Subjective: Patient stayed in ICU overnight bc she was started on precedex drip for delirium. They are weaning drip this am and expect her to be on the floor by early afternoon.  Patient says she feels nauseated but otherwise no complaints.   Objective: Vital signs in last 24 hours: Filed Vitals:   04/25/12 0356 04/25/12 0400 04/25/12 0430 04/25/12 0600  BP:  132/83  104/71  Pulse:  85  88  Temp:   97.8 F (36.6 C)   TempSrc:   Oral   Resp:  16  16  Height:      Weight:      SpO2: 100% 100%  94%   Weight change:   Intake/Output Summary (Last 24 hours) at 04/25/12 0743 Last data filed at 04/25/12 0600  Gross per 24 hour  Intake 1011.6 ml  Output   2150 ml  Net -1138.4 ml   Vitals reviewed. General: resting in bed, NAD, somnolent HEENT: MMM of OP Neck: R IJ line is clean and dry Cardiac: RRR w HR in 80s, no rubs, murmurs or gallops Pulm: good air movement, decreased breath sounds in bilateral bases, some upper airway rhonchi.  Abd: soft, nontender, nondistended, BS present Ext: warm and well perfused, no pedal edema Neuro: alert and oriented X3, will engage in conversation but somnolent  Lab Results: Basic Metabolic Panel:  Lab 04/25/12 1610 04/24/12 0540  NA 140 144  K 3.8 3.9  CL 99 99  CO2 35* 37*  GLUCOSE 119* 106*  BUN 17 13  CREATININE 0.45* 0.35*  CALCIUM 8.4 8.7  MG 2.2 2.3  PHOS 2.7 2.0*   Liver Function Tests:  Lab 04/24/12 0540 04/21/12 0450  AST 55* 31  ALT 64* 48*  ALKPHOS 56 51  BILITOT 0.6 0.1*  PROT 6.0 5.3*  ALBUMIN 2.4* 1.9*   CBC:  Lab 04/23/12 0440 04/22/12 0500  WBC 8.9 10.2  NEUTROABS 7.3 8.8*  HGB 10.1* 10.4*  HCT 31.2* 32.3*  MCV 95.1 95.8  PLT 271 230   Cardiac Enzymes:  Lab 04/20/12 1115  CKTOTAL --  CKMB --  CKMBINDEX --  TROPONINI <0.30  CBG:  Lab 04/25/12 0738 04/25/12 0411 04/25/12 0031 04/24/12 1956 04/24/12 1559 04/24/12 1148  GLUCAP 89 108* 118* 90 113* 113*   Hemoglobin A1C:  Lab 04/21/12 0830    HGBA1C 6.0*   Anemia Panel:  Lab 04/21/12 0830  VITAMINB12 1378*  FOLATE 18.8  FERRITIN 1631*  TIBC 136*  IRON 74  RETICCTPCT 0.7   Urine Drug Screen: Drugs of Abuse     Component Value Date/Time   LABOPIA NONE DETECTED 04/17/2012 1421   COCAINSCRNUR NONE DETECTED 04/17/2012 1421   LABBENZ NONE DETECTED 04/17/2012 1421   AMPHETMU NONE DETECTED 04/17/2012 1421   THCU NONE DETECTED 04/17/2012 1421   LABBARB NONE DETECTED 04/17/2012 1421    Urinalysis:  Lab 04/20/12 2231  COLORURINE YELLOW  LABSPEC 1.010  PHURINE 5.0  GLUCOSEU NEGATIVE  HGBUR NEGATIVE  BILIRUBINUR NEGATIVE  KETONESUR NEGATIVE  PROTEINUR NEGATIVE  UROBILINOGEN 0.2  NITRITE NEGATIVE  LEUKOCYTESUR NEGATIVE   Micro Results: Recent Results (from the past 240 hour(s))  CULTURE, BLOOD (ROUTINE X 2)     Status: Normal   Collection Time   04/17/12 12:50 PM      Component Value Range Status Comment   Specimen Description BLOOD RIGHT ARM   Final    Special Requests BOTTLES DRAWN AEROBIC AND ANAEROBIC 10CC   Final    Culture  Setup Time  04/17/2012 18:03   Final    Culture NO GROWTH 5 DAYS   Final    Report Status 04/23/2012 FINAL   Final   CULTURE, BLOOD (ROUTINE X 2)     Status: Normal   Collection Time   04/17/12 12:55 PM      Component Value Range Status Comment   Specimen Description BLOOD RIGHT HAND   Final    Special Requests BOTTLES DRAWN AEROBIC ONLY 4CC   Final    Culture  Setup Time 04/17/2012 18:03   Final    Culture NO GROWTH 5 DAYS   Final    Report Status 04/23/2012 FINAL   Final   URINE CULTURE     Status: Normal   Collection Time   04/17/12  2:21 PM      Component Value Range Status Comment   Specimen Description URINE, CLEAN CATCH   Final    Special Requests Normal   Final    Culture  Setup Time 04/17/2012 20:18   Final    Colony Count 40,000 COLONIES/ML   Final    Culture     Final    Value: Multiple bacterial morphotypes present, none predominant. Suggest appropriate recollection if  clinically indicated.   Report Status 04/18/2012 FINAL   Final   MRSA PCR SCREENING     Status: Normal   Collection Time   04/19/12 10:27 AM      Component Value Range Status Comment   MRSA by PCR NEGATIVE  NEGATIVE Final   URINE CULTURE     Status: Normal   Collection Time   04/20/12 10:31 PM      Component Value Range Status Comment   Specimen Description URINE, CATHETERIZED   Final    Special Requests NONE   Final    Culture  Setup Time 04/21/2012 06:33   Final    Colony Count NO GROWTH   Final    Culture NO GROWTH   Final    Report Status 04/22/2012 FINAL   Final   CLOSTRIDIUM DIFFICILE BY PCR     Status: Normal   Collection Time   04/21/12  1:41 AM      Component Value Range Status Comment   C difficile by pcr NEGATIVE  NEGATIVE Final   CULTURE, RESPIRATORY     Status: Normal   Collection Time   04/21/12  9:11 AM      Component Value Range Status Comment   Specimen Description TRACHEAL ASPIRATE   Final    Special Requests NONE   Final    Gram Stain     Final    Value: FEW WBC PRESENT, PREDOMINANTLY MONONUCLEAR     FEW SQUAMOUS EPITHELIAL CELLS PRESENT     NO ORGANISMS SEEN   Culture     Final    Value: MODERATE YEAST CONSISTENT WITH CANDIDA SPECIES     FEW CANDIDA ALBICANS   Report Status 04/23/2012 FINAL   Final   RESPIRATORY VIRUS PANEL     Status: Abnormal   Collection Time   04/21/12  5:56 PM      Component Value Range Status Comment   Source - [facility] NASAL SWAB   Corrected CORRECTED ON 01/17 AT 1112: PREVIOUSLY REPORTED AS NASAL SWAB   Respiratory Syncytial Virus A NOT DETECTED   Final    Respiratory Syncytial Virus B NOT DETECTED   Final    Influenza A DETECTED (*)  Final    Influenza B NOT DETECTED   Final  Parainfluenza 1 NOT DETECTED   Final    Parainfluenza 2 NOT DETECTED   Final    Parainfluenza 3 NOT DETECTED   Final    Metapneumovirus NOT DETECTED   Final    Rhinovirus NOT DETECTED   Final    Adenovirus NOT DETECTED   Final    Influenza A H1 NOT  DETECTED   Final    Influenza A H3 NOT DETECTED   Final   CULTURE, RESPIRATORY     Status: Normal (Preliminary result)   Collection Time   04/22/12  6:18 PM      Component Value Range Status Comment   Specimen Description TRACHEAL ASPIRATE   Final    Special Requests NONE   Final    Gram Stain     Final    Value: RARE WBC PRESENT, PREDOMINANTLY PMN     NO ORGANISMS SEEN   Culture FEW CANDIDA ALBICANS   Final    Report Status PENDING   Incomplete   RESPIRATORY VIRUS PANEL     Status: Abnormal   Collection Time   04/22/12  6:36 PM      Component Value Range Status Comment   Source - [facility] TRACHEAL ASPIRATE   Final    Respiratory Syncytial Virus A NOT DETECTED   Final    Respiratory Syncytial Virus B NOT DETECTED   Final    Influenza A DETECTED (*)  Final    Influenza B NOT DETECTED   Final    Parainfluenza 1 NOT DETECTED   Final    Parainfluenza 2 NOT DETECTED   Final    Parainfluenza 3 NOT DETECTED   Final    Metapneumovirus NOT DETECTED   Final    Rhinovirus NOT DETECTED   Final    Adenovirus NOT DETECTED   Final    Influenza A H1 NOT DETECTED   Final    Influenza A H3 NOT DETECTED   Final    Studies/Results: Dg Chest Port 1 View  04/23/2012  *RADIOLOGY REPORT*  Clinical Data: Evaluate ET tube placement  PORTABLE CHEST - 1 VIEW  Comparison: 04/22/2012  Findings: ET tube tip is above the carina.  There is a right IJ catheter with tip in the projection the SVC.  Nasogastric tube is in place.  Lung volumes are low.  There is persistent bilateral lower lobe airspace opacities.  This is not significantly changed from previous exam.  IMPRESSION:  1.  No change in bibasilar airspace opacities.   Original Report Authenticated By: Signa Kell, M.D.    Medications: I have reviewed the patient's current medications. Scheduled Meds:   . antiseptic oral rinse  15 mL Mouth Rinse BID  . citalopram  40 mg Oral Daily  . enoxaparin (LOVENOX) injection  40 mg Subcutaneous Q24H  . insulin aspart   0-9 Units Subcutaneous Q4H  . levalbuterol  1.25 mg Nebulization Q4H   And  . ipratropium  0.5 mg Nebulization Q4H  . levofloxacin (LEVAQUIN) IV  750 mg Intravenous Q24H  . multivitamin  5 mL Per Tube Daily  . oseltamivir  150 mg Oral BID  . predniSONE  40 mg Oral Q breakfast   Continuous Infusions:   . sodium chloride 20 mL/hr at 04/20/12 2000   PRN Meds:.acetaminophen, haloperidol lactate, ipratropium, levalbuterol, loperamide, ondansetron, ondansetron  Assessment/Plan: Patient is a 47 year old female who is being transferred out of the ICU today after being treated for flu, sepsis 2/2 strep pneumo bronchopneumonia and severe ARDS.  Influenza A complicated by Strep pneumo bronchopneumonia   ARDS/sepsis has resolved and patient to be transferred out of ICU today. Patient extubated on 1/17.  Vancomycin and Cefepime discontinued on 1/17. She continues on levaquin and tamiflu. Patient is maintaining adequate oxygen saturations on nasal cannula.  -Continue Atrovent and Xopenex PRN  -taper steroids, currently on Prednisone 40mg  daily  -continue Tamiflu (day 5/10) and Levaquin (day 6/10)  -Blood cultures now growth (final) -f/u sputum/repiratory culture (with few Candida Albicans but final report pending)  -PT/OT eval   Diarrhea  Stool C.diff PCR negative.  -Continue Imodium PRN   Anxiety/Depression  Possible delirium as well, likely worsened by steroids use.  -Continue Celexa  -Haldol PRN   COPD Patient likely w underlying COPD. Has never used home inhalers. No wheezing on my exam this mornig. - Continue nebs - Will need PFTs as outpatient after acute illness resolves   DVT prophylaxis: Lovenox   Dispo: Disposition is deferred at this time, awaiting improvement of current medical problems.  Anticipated discharge in approximately 2-3 day(s).   The patient does not have a current PCP (No primary provider on file.), therefore will be requiring OPC follow-up after discharge.     The patient does not have transportation limitations that hinder transportation to clinic appointments.  .Services Needed at time of discharge: Y = Yes, Blank = No PT:   OT:   RN:   Equipment:   Other:     LOS: 8 days   Bronson Curb 04/25/2012, 7:43 AM

## 2012-04-26 DIAGNOSIS — J09X1 Influenza due to identified novel influenza A virus with pneumonia: Secondary | ICD-10-CM

## 2012-04-26 DIAGNOSIS — J13 Pneumonia due to Streptococcus pneumoniae: Secondary | ICD-10-CM

## 2012-04-26 LAB — GLUCOSE, CAPILLARY
Glucose-Capillary: 159 mg/dL — ABNORMAL HIGH (ref 70–99)
Glucose-Capillary: 118 mg/dL — ABNORMAL HIGH (ref 70–99)

## 2012-04-26 LAB — BASIC METABOLIC PANEL
BUN: 11 mg/dL (ref 6–23)
Chloride: 97 mEq/L (ref 96–112)
Creatinine, Ser: 0.52 mg/dL (ref 0.50–1.10)
GFR calc Af Amer: >90 mL/min (ref >90)
Glucose, Bld: 133 mg/dL — ABNORMAL HIGH (ref 70–99)
Potassium: 3.6 mEq/L (ref 3.5–5.1)

## 2012-04-26 LAB — MAGNESIUM: Magnesium: 2 mg/dL (ref 1.5–2.5)

## 2012-04-26 MED ORDER — NICOTINE 21 MG/24HR TD PT24
21.0000 mg | MEDICATED_PATCH | Freq: Every day | TRANSDERMAL | Status: DC
  Administered 2012-04-26: 21 mg via TRANSDERMAL
  Filled 2012-04-26 (×2): qty 1

## 2012-04-26 MED ORDER — ALPRAZOLAM 0.5 MG PO TABS
0.5000 mg | ORAL_TABLET | Freq: Once | ORAL | Status: AC
  Administered 2012-04-26: 0.5 mg via ORAL
  Filled 2012-04-26: qty 1

## 2012-04-26 MED ORDER — LORAZEPAM 0.5 MG PO TABS
0.5000 mg | ORAL_TABLET | Freq: Three times a day (TID) | ORAL | Status: DC | PRN
  Administered 2012-04-26 – 2012-04-27 (×2): 0.5 mg via ORAL
  Filled 2012-04-26 (×2): qty 1

## 2012-04-26 MED ORDER — ADULT MULTIVITAMIN W/MINERALS CH
1.0000 | ORAL_TABLET | Freq: Every day | ORAL | Status: DC
  Administered 2012-04-26 – 2012-04-27 (×2): 1 via ORAL
  Filled 2012-04-26 (×3): qty 1

## 2012-04-26 MED ORDER — ENSURE COMPLETE PO LIQD
237.0000 mL | ORAL | Status: DC
  Administered 2012-04-26: 237 mL via ORAL

## 2012-04-26 NOTE — Progress Notes (Addendum)
Subjective: Doing well this morning. Fully alert. Ambulated around room w BF today. Asking when she will be ready to leave. Discussed need to see her regain strength and wean from oxygen therapy. Patient also reported some anxiety for which she has received prn xanax. Prior history of heavy EtOH use, but pt now in hospital for 9 days. Prior heavy smoker. Patient says she feels agitated but otherwise no complaints.  Denies CP, N/V, dizziness, abdominal pain, diarrhea.   Objective: Vital signs in last 24 hours: Filed Vitals:   04/25/12 1952 04/25/12 2041 04/26/12 0257 04/26/12 0523  BP:  110/75  129/84  Pulse:  105  92  Temp:  98.7 F (37.1 C)  96.8 F (36 C)  TempSrc:  Oral  Oral  Resp:  23  22  Height:      Weight:    150 lb 5.7 oz (68.2 kg)  SpO2: 93% 93% 96% 94%   Weight change:   Intake/Output Summary (Last 24 hours) at 04/26/12 0911 Last data filed at 04/26/12 0800  Gross per 24 hour  Intake    470 ml  Output   1000 ml  Net   -530 ml   Vitals reviewed. General: resting in bed, watching television, NAD HEENT: MMM of OP, Nasal cannula in place Neck: bruising on neck from site prior R IJ line, no hematoma Cardiac: RRR w HR in 90s, no rubs, murmurs or gallops Pulm: good air movement, decreased breath sounds in bilateral bases with some crackles, no wheezes or rhonchi Abd: soft, nontender, nondistended, BS present Ext: warm and well perfused, no pedal edema Neuro: alert and oriented X3, sensation and strength intact in bilateral UE and LE  Lab Results: Basic Metabolic Panel:  Lab 04/26/12 0981 04/25/12 0444  NA 140 140  K 3.6 3.8  CL 97 99  CO2 33* 35*  GLUCOSE 133* 119*  BUN 11 17  CREATININE 0.52 0.45*  CALCIUM 8.6 8.4  MG 2.0 2.2  PHOS 3.8 2.7   Liver Function Tests:  Lab 04/24/12 0540 04/21/12 0450  AST 55* 31  ALT 64* 48*  ALKPHOS 56 51  BILITOT 0.6 0.1*  PROT 6.0 5.3*  ALBUMIN 2.4* 1.9*   CBC:  Lab 04/23/12 0440 04/22/12 0500  WBC 8.9 10.2   NEUTROABS 7.3 8.8*  HGB 10.1* 10.4*  HCT 31.2* 32.3*  MCV 95.1 95.8  PLT 271 230   Cardiac Enzymes:  Lab 04/20/12 1115  CKTOTAL --  CKMB --  CKMBINDEX --  TROPONINI <0.30  CBG:  Lab 04/26/12 0616 04/25/12 2044 04/25/12 1617 04/25/12 1212 04/25/12 0738 04/25/12 0411  GLUCAP 134* 150* 99 181* 89 108*   Hemoglobin A1C:  Lab 04/21/12 0830  HGBA1C 6.0*   Anemia Panel:  Lab 04/21/12 0830  VITAMINB12 1378*  FOLATE 18.8  FERRITIN 1631*  TIBC 136*  IRON 74  RETICCTPCT 0.7   Urine Drug Screen: Drugs of Abuse     Component Value Date/Time   LABOPIA NONE DETECTED 04/17/2012 1421   COCAINSCRNUR NONE DETECTED 04/17/2012 1421   LABBENZ NONE DETECTED 04/17/2012 1421   AMPHETMU NONE DETECTED 04/17/2012 1421   THCU NONE DETECTED 04/17/2012 1421   LABBARB NONE DETECTED 04/17/2012 1421    Urinalysis:  Lab 04/20/12 2231  COLORURINE YELLOW  LABSPEC 1.010  PHURINE 5.0  GLUCOSEU NEGATIVE  HGBUR NEGATIVE  BILIRUBINUR NEGATIVE  KETONESUR NEGATIVE  PROTEINUR NEGATIVE  UROBILINOGEN 0.2  NITRITE NEGATIVE  LEUKOCYTESUR NEGATIVE   Micro Results: Recent Results (from the past 240  hour(s))  CULTURE, BLOOD (ROUTINE X 2)     Status: Normal   Collection Time   04/17/12 12:50 PM      Component Value Range Status Comment   Specimen Description BLOOD RIGHT ARM   Final    Special Requests BOTTLES DRAWN AEROBIC AND ANAEROBIC 10CC   Final    Culture  Setup Time 04/17/2012 18:03   Final    Culture NO GROWTH 5 DAYS   Final    Report Status 04/23/2012 FINAL   Final   CULTURE, BLOOD (ROUTINE X 2)     Status: Normal   Collection Time   04/17/12 12:55 PM      Component Value Range Status Comment   Specimen Description BLOOD RIGHT HAND   Final    Special Requests BOTTLES DRAWN AEROBIC ONLY 4CC   Final    Culture  Setup Time 04/17/2012 18:03   Final    Culture NO GROWTH 5 DAYS   Final    Report Status 04/23/2012 FINAL   Final   URINE CULTURE     Status: Normal   Collection Time   04/17/12   2:21 PM      Component Value Range Status Comment   Specimen Description URINE, CLEAN CATCH   Final    Special Requests Normal   Final    Culture  Setup Time 04/17/2012 20:18   Final    Colony Count 40,000 COLONIES/ML   Final    Culture     Final    Value: Multiple bacterial morphotypes present, none predominant. Suggest appropriate recollection if clinically indicated.   Report Status 04/18/2012 FINAL   Final   MRSA PCR SCREENING     Status: Normal   Collection Time   04/19/12 10:27 AM      Component Value Range Status Comment   MRSA by PCR NEGATIVE  NEGATIVE Final   URINE CULTURE     Status: Normal   Collection Time   04/20/12 10:31 PM      Component Value Range Status Comment   Specimen Description URINE, CATHETERIZED   Final    Special Requests NONE   Final    Culture  Setup Time 04/21/2012 06:33   Final    Colony Count NO GROWTH   Final    Culture NO GROWTH   Final    Report Status 04/22/2012 FINAL   Final   CLOSTRIDIUM DIFFICILE BY PCR     Status: Normal   Collection Time   04/21/12  1:41 AM      Component Value Range Status Comment   C difficile by pcr NEGATIVE  NEGATIVE Final   CULTURE, RESPIRATORY     Status: Normal   Collection Time   04/21/12  9:11 AM      Component Value Range Status Comment   Specimen Description TRACHEAL ASPIRATE   Final    Special Requests NONE   Final    Gram Stain     Final    Value: FEW WBC PRESENT, PREDOMINANTLY MONONUCLEAR     FEW SQUAMOUS EPITHELIAL CELLS PRESENT     NO ORGANISMS SEEN   Culture     Final    Value: MODERATE YEAST CONSISTENT WITH CANDIDA SPECIES     FEW CANDIDA ALBICANS   Report Status 04/23/2012 FINAL   Final   RESPIRATORY VIRUS PANEL     Status: Abnormal   Collection Time   04/21/12  5:56 PM      Component Value Range Status Comment  Source - [facility] NASAL SWAB   Corrected CORRECTED ON 01/17 AT 1112: PREVIOUSLY REPORTED AS NASAL SWAB   Respiratory Syncytial Virus A NOT DETECTED   Final    Respiratory Syncytial Virus B  NOT DETECTED   Final    Influenza A DETECTED (*)  Final    Influenza B NOT DETECTED   Final    Parainfluenza 1 NOT DETECTED   Final    Parainfluenza 2 NOT DETECTED   Final    Parainfluenza 3 NOT DETECTED   Final    Metapneumovirus NOT DETECTED   Final    Rhinovirus NOT DETECTED   Final    Adenovirus NOT DETECTED   Final    Influenza A H1 NOT DETECTED   Final    Influenza A H3 NOT DETECTED   Final   CULTURE, RESPIRATORY     Status: Normal   Collection Time   04/22/12  6:18 PM      Component Value Range Status Comment   Specimen Description TRACHEAL ASPIRATE   Final    Special Requests NONE   Final    Gram Stain     Final    Value: RARE WBC PRESENT, PREDOMINANTLY PMN     NO ORGANISMS SEEN   Culture FEW CANDIDA ALBICANS   Final    Report Status 04/25/2012 FINAL   Final   RESPIRATORY VIRUS PANEL     Status: Abnormal   Collection Time   04/22/12  6:36 PM      Component Value Range Status Comment   Source - [facility] TRACHEAL ASPIRATE   Final    Respiratory Syncytial Virus A NOT DETECTED   Final    Respiratory Syncytial Virus B NOT DETECTED   Final    Influenza A DETECTED (*)  Final    Influenza B NOT DETECTED   Final    Parainfluenza 1 NOT DETECTED   Final    Parainfluenza 2 NOT DETECTED   Final    Parainfluenza 3 NOT DETECTED   Final    Metapneumovirus NOT DETECTED   Final    Rhinovirus NOT DETECTED   Final    Adenovirus NOT DETECTED   Final    Influenza A H1 NOT DETECTED   Final    Influenza A H3 NOT DETECTED   Final    Studies/Results: No results found. Medications: I have reviewed the patient's current medications. Scheduled Meds:    . antiseptic oral rinse  15 mL Mouth Rinse BID  . citalopram  40 mg Oral Daily  . enoxaparin (LOVENOX) injection  40 mg Subcutaneous Q24H  . insulin aspart  0-5 Units Subcutaneous QHS  . insulin aspart  0-9 Units Subcutaneous TID WC  . levalbuterol  1.25 mg Nebulization Q6H   And  . ipratropium  0.5 mg Nebulization Q6H  . levofloxacin  (LEVAQUIN) IV  750 mg Intravenous Q24H  . multivitamin  5 mL Per Tube Daily  . oseltamivir  150 mg Oral BID  . predniSONE  40 mg Oral Q breakfast   Continuous Infusions:    . sodium chloride 20 mL/hr at 04/20/12 2000   PRN Meds:.acetaminophen, haloperidol lactate, ipratropium, levalbuterol, ondansetron, ondansetron  Assessment/Plan: Patient is a 47 year old female transferred back to [place] yesterday from the ICUafter being treated for flu, sepsis 2/2 strep pneumo bronchopneumonia and severe ARDS.  Influenza A complicated by Strep pneumo bronchopneumonia   ARDS/sepsis has resolve. Patient extubated on 1/17.  Vancomycin and Cefepime discontinued on 1/17. She continues on  levaquin and tamiflu. Blood cultures NGTD final. Sputum/repiratory culture few Candida Albicans. Patient is maintaining adequate oxygen saturations on nasal cannula.  -Continue Atrovent and Xopenex PRN  -taper steroids, currently on Prednisone 40mg  daily  -continue Tamiflu (day 7/10) and Levaquin (day 7/10)  -PT/OT eval  - wean O2  Diarrhea  Resolved. Had diarrhea in ICU. Stool C.diff PCR negative. No complaints of diarrhea today. -Continue Imodium PRN   Anxiety/Depression  Patient reports some baseline anxiety/agitation. Treated for ICU delirium w precedex drip which was d/c'd yesterday. Agitation likely worsened by steroid use. 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 Pt w modertae EtOH use in past, but has been in hospital 9 days now w/o drink -Continue Celexa  - nicoderm patch -ativan PRN  - d/c prn haldol  COPD Patient likely w underlying COPD. Has never used home inhalers. No wheezing on my exam this mornig. - Continue nebs - Will need PFTs as outpatient after acute illness resolves   DVT prophylaxis: Lovenox   Dispo: Disposition is deferred at this time, awaiting improvement of current medical problems.  Anticipated discharge in approximately 2-3 day(s).   The patient does not have a current PCP  (No primary provider on file.), therefore will be requiring OPC follow-up after discharge.   The patient does not have transportation limitations that hinder transportation to clinic appointments.  .Services Needed at time of discharge: Y = Yes, Blank = No PT:   OT:   RN:   Equipment:   Other:     LOS: 9 days   Bronson Curb 04/26/2012, 9:11 AM

## 2012-04-26 NOTE — Progress Notes (Signed)
NUTRITION FOLLOW UP  Intervention:   Ensure Complete po daily, each supplement provides 350 kcal and 13 grams of protein. RD to continue to follow nutrition care plan  Nutrition Dx:   1. Inadequate oral intake related to inability to eat as evidenced by NPO status, resolved. 2. Increased nutrient needs r/t acute illness AEB estimated needs.  Goal:   Intake to meet >90% of estimated nutrition needs, met.  Monitor:   labs, weight trends, po intake, I/O's  Assessment:   Extubated 1/17. TF discontinued with extubation. Transferred to 4700 on 1/19 (from medical ICU.) Pt is eating 100% of meals at this time. She confirms that she is eating her meals well. Also noted on bedside table a large [employer] milkshake that pt has consumed at least half of. She is agreeable to Ensure daily to help continue her healing from her acute illness.  Height: Ht Readings from Last 1 Encounters:  04/25/12 5\' 3"  (1.6 m)    Weight Status:   Wt Readings from Last 1 Encounters:  04/26/12 150 lb 5.7 oz (68.2 kg)  Wt on admission was 163 lb, wt trending down  Re-estimated needs:  Kcal: 1450 - 1600 kcal Protein: 70 - 85 gm Fluid: 1.5-1.6 L  Skin: no problems noted  Diet Order: General   Intake/Output Summary (Last 24 hours) at 04/26/12 1409 Last data filed at 04/26/12 1330  Gross per 24 hour  Intake    480 ml  Output    700 ml  Net   -220 ml    Last BM: 1/19   Labs:   Lab 04/26/12 0540 04/25/12 0444 04/24/12 0540  NA 140 140 144  K 3.6 3.8 3.9  CL 97 99 99  CO2 33* 35* 37*  BUN 11 17 13   CREATININE 0.52 0.45* 0.35*  CALCIUM 8.6 8.4 8.7  MG 2.0 2.2 2.3  PHOS 3.8 2.7 2.0*  GLUCOSE 133* 119* 106*    CBG (last 3)   Basename 04/26/12 1133 04/26/12 0616 04/25/12 2044  GLUCAP 140* 134* 150*    Scheduled Meds:    . antiseptic oral rinse  15 mL Mouth Rinse BID  . citalopram  40 mg Oral Daily  . enoxaparin (LOVENOX) injection  40 mg Subcutaneous Q24H  . insulin aspart  0-5 Units  Subcutaneous QHS  . insulin aspart  0-9 Units Subcutaneous TID WC  . levalbuterol  1.25 mg Nebulization Q6H   And  . ipratropium  0.5 mg Nebulization Q6H  . levofloxacin (LEVAQUIN) IV  750 mg Intravenous Q24H  . multivitamin  5 mL Per Tube Daily  . nicotine  21 mg Transdermal Daily  . oseltamivir  150 mg Oral BID  . predniSONE  40 mg Oral Q breakfast    Continuous Infusions:    . sodium chloride 20 mL/hr at 04/20/12 2000   Jarold Motto MS, RD, LDN Pager: [phone] After-hours pager: [phone]

## 2012-04-26 NOTE — Evaluation (Signed)
Occupational Therapy Evaluation and Discharge Patient Details Name: Patricia Farrell MRN: [id number] DOB: November 08, 1965 Today's Date: 04/26/2012 Time: 4098-1191 OT Time Calculation (min): 16 min  OT Assessment / Plan / Recommendation Clinical Impression  This 47 yo female admitted with fever, cough and progressive shortness of breath with resultant VDRF presents to acute OT with all education completed. Will sign off.    OT Assessment  Patient does not need any further OT services    Follow Up Recommendations  No OT follow up       Equipment Recommendations  None recommended by OT          Precautions / Restrictions Precautions Precautions: Fall Precaution Comments: O2 stats dropped to  87% on 3 liters after going to bathroom and back. (pt was on 4 liters when I entered the room) Restrictions Weight Bearing Restrictions: No       ADL  Eating/Feeding: Simulated;Independent Where Assessed - Eating/Feeding: Edge of bed Grooming: Simulated;Min guard Where Assessed - Grooming: Unsupported standing Upper Body Bathing: Simulated;Min guard Where Assessed - Upper Body Bathing: Unsupported sit to stand Lower Body Bathing: Simulated;Min guard Where Assessed - Lower Body Bathing: Unsupported sit to stand Upper Body Dressing: Simulated;Min guard Where Assessed - Upper Body Dressing: Unsupported sit to stand Lower Body Dressing: Min guard;Performed Where Assessed - Lower Body Dressing: Unsupported sit to stand Toilet Transfer: Minimal assistance;Simulated Toilet Transfer Method: Sit to Barista:  (Bed into bathroom and back to chair) Toileting - Clothing Manipulation and Hygiene: Simulated;Min guard Where Assessed - Engineer, mining and Hygiene: Standing Equipment Used:  (None) Transfers/Ambulation Related to ADLs: Min guard A sit to stand and stand to sit, Min A ambulation ADL Comments: Educated pt on energy conservation techniques and issued  handout      Visit Information  Last OT Received On: 04/26/12 Assistance Needed: +1       Prior Functioning     Home Living Lives With: Significant other Available Help at Discharge: Family;Available 24 hours/day Type of Home: House Home Access: Level entry Home Layout: One level Bathroom Shower/Tub: Health visitor: Standard Home Adaptive Equipment: Shower chair with back Prior Function Level of Independence: Independent Able to Take Stairs?: Yes Driving: Yes 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 Communication: No difficulties Dominant Hand: Right            Cognition  Overall Cognitive Status: Appears within functional limits for tasks assessed/performed Arousal/Alertness: Awake/alert Orientation Level: Appears intact for tasks assessed Behavior During Session: [facility] for tasks performed    Extremity/Trunk Assessment Right Upper Extremity Assessment RUE ROM/Strength/Tone: Within functional levels Left Upper Extremity Assessment LUE ROM/Strength/Tone: Within functional levels     Mobility Bed Mobility Details for Bed Mobility Assistance: Pt already sitting EOB upon my arrival Transfers Transfers: Sit to Stand;Stand to Sit Sit to Stand: 4: Min guard;With upper extremity assist;From bed Stand to Sit: 4: Min guard;With upper extremity assist;With armrests;To chair/3-in-1              End of Session OT - End of Session Activity Tolerance: Patient tolerated treatment well Patient left: in chair;with call bell/phone within reach;with family/visitor present Nurse Communication:  (O2 dropped on 3 liters so returned to 4 liters)    Evette Georges [phone] 04/26/2012, 9:35 AM

## 2012-04-26 NOTE — Progress Notes (Signed)
Internal Medicine Attending  Date: 04/26/2012  Patient name: Patricia Farrell Medical record number: [id number] Date of birth: 1965/05/03 Age: 47 y.o. Gender: female  I saw and evaluated the patient, reviewed the chart, and discussed patient with resident Dr. Loura Pardon. I reviewed the resident's note by Dr. Heloise Beecham and I agree with the resident's findings and plans as documented in her note.  Patient is doing well following transfer out of intensive care unit to our service.  Exam is notable for a few bibasilar crackles; regular rhythm, no extra heart sounds or murmurs; abdomen soft, nontender, bowel sounds present; no lower extremity edema.  Patient is desaturating with ambulation on 3 L nasal cannula oxygen, and required 4 L while ambulating to maintain adequate oxygen saturation.  Plan is continue antibiotics for pneumonia and influenza; physical therapy; continue inhaled bronchodilators; counsel and assist with smoking cessation.

## 2012-04-26 NOTE — Progress Notes (Signed)
Physical Therapy Treatment Patient Details Name: Patricia Farrell MRN: [id number] DOB: 1965-11-07 Today's Date: 04/26/2012 Time: 4098-1191 PT Time Calculation (min): 12 min  PT Assessment / Plan / Recommendation Comments on Treatment Session  Patient presents with improving activity tolerance.  Still requiring supplemental O2, however, this session maintained Sats at 94% on 3 L with hallway ambulation while talking with son.  Feel she will need follow up HHPT, then maybe pulmonary rehab.    Follow Up Recommendations  Home health PT           Equipment Recommendations  None recommended by PT       Frequency Min 3X/week   Plan Discharge plan remains appropriate    Precautions / Restrictions Precautions Precautions: Fall   Pertinent Vitals/Pain Denies pain    Mobility  Bed Mobility Bed Mobility: Sit to Supine Sit to Supine: 5: Supervision Details for Bed Mobility Assistance: laying in diagonal position on bed, but adequate for patient comfort and with multiple visitors did not ask pt to reposition Transfers Sit to Stand: 5: Supervision;With upper extremity assist;From bed Stand to Sit: To bed;5: Supervision;With upper extremity assist Ambulation/Gait Ambulation/Gait Assistance: 4: Min assist Ambulation Distance (Feet): 200 Feet Assistive device: 1 person hand held assist Ambulation/Gait Assistance Details: occasional use of wall rail versus light 1 handed assist; patient walking and talking and SpO2 remained at 94% on 3 L; stiff throughout trunk and head/neck with gait Gait Pattern: Wide base of support;Decreased stride length;Decreased trunk rotation      PT Goals Acute Rehab PT Goals Pt will go Supine/Side to Sit: Independently PT Goal: Supine/Side to Sit - Progress: Progressing toward goal Pt will go Sit to Stand: Independently PT Goal: Sit to Stand - Progress: Progressing toward goal Pt will Ambulate: 51 - 150 feet;with modified independence;with least restrictive  assistive device PT Goal: Ambulate - Progress: Progressing toward goal  Visit Information  Last PT Received On: 04/26/12    Subjective Data  Subjective: I walked earlier today in hallway.  Seems easier now.   Cognition  Overall Cognitive Status: Appears within functional limits for tasks assessed/performed Arousal/Alertness: Awake/alert Orientation Level: Appears intact for tasks assessed Behavior During Session: [facility] for tasks performed    Balance  Static Standing Balance Static Standing - Balance Support: No upper extremity supported Static Standing - Level of Assistance: 5: Stand by assistance Static Standing - Comment/# of Minutes: stood approx 2 minutes while getting O2 set up on portable tanke and assessing Sp02  End of Session PT - End of Session Equipment Utilized During Treatment: Gait belt Activity Tolerance: Patient tolerated treatment well Patient left: in bed;with call bell/phone within reach;with family/visitor present   GP     [facility] 04/26/2012, 4:54 PM Sheran Lawless, PT [phone] 04/26/2012

## 2012-04-27 DIAGNOSIS — R197 Diarrhea, unspecified: Secondary | ICD-10-CM

## 2012-04-27 DIAGNOSIS — J80 Acute respiratory distress syndrome: Secondary | ICD-10-CM | POA: Diagnosis not present

## 2012-04-27 LAB — BASIC METABOLIC PANEL
BUN: 9 mg/dL (ref 6–23)
Chloride: 95 mEq/L — ABNORMAL LOW (ref 96–112)
GFR calc Af Amer: >90 mL/min (ref >90)
GFR calc non Af Amer: >90 mL/min (ref >90)
Potassium: 3.7 mEq/L (ref 3.5–5.1)
Sodium: 137 mEq/L (ref 135–145)

## 2012-04-27 LAB — CBC WITH DIFFERENTIAL/PLATELET
Basophils Absolute: 0 10*3/uL (ref 0.0–0.1)
Basophils Relative: 0 % (ref 0–1)
Eosinophils Absolute: 0.1 10*3/uL (ref 0.0–0.7)
MCH: 30.3 pg (ref 26.0–34.0)
MCHC: 33.3 g/dL (ref 30.0–36.0)
Neutro Abs: 7.1 10*3/uL (ref 1.7–7.7)
Neutrophils Relative %: 69 % (ref 43–77)
Platelets: 282 10*3/uL (ref 150–400)
RDW: 13.5 % (ref 11.5–15.5)

## 2012-04-27 LAB — GLUCOSE, CAPILLARY
Glucose-Capillary: 95 mg/dL (ref 70–99)
Glucose-Capillary: 95 mg/dL (ref 70–99)

## 2012-04-27 MED ORDER — IPRATROPIUM BROMIDE 0.02 % IN SOLN: 500.0000 ug | Freq: Four times a day (QID) | RESPIRATORY_TRACT | Status: DC | PRN

## 2012-04-27 MED ORDER — LEVOFLOXACIN 750 MG PO TABS: 750.0000 mg | ORAL_TABLET | Freq: Every day | ORAL | Status: DC

## 2012-04-27 MED ORDER — LEVOFLOXACIN 750 MG PO TABS
750.0000 mg | ORAL_TABLET | Freq: Every day | ORAL | Status: DC
  Administered 2012-04-27: 750 mg via ORAL
  Filled 2012-04-27: qty 1

## 2012-04-27 MED ORDER — ALBUTEROL SULFATE (2.5 MG/3ML) 0.083% IN NEBU: 2.5000 mg | INHALATION_SOLUTION | Freq: Four times a day (QID) | RESPIRATORY_TRACT | Status: DC | PRN

## 2012-04-27 MED ORDER — PREDNISONE 10 MG PO TABS
30.0000 mg | ORAL_TABLET | Freq: Every day | ORAL | Status: DC
  Administered 2012-04-27: 30 mg via ORAL
  Filled 2012-04-27 (×2): qty 1

## 2012-04-27 MED ORDER — PREDNISONE 10 MG PO TABS: ORAL_TABLET | ORAL | Status: DC

## 2012-04-27 MED ORDER — OSELTAMIVIR PHOSPHATE 75 MG PO CAPS: 150.0000 mg | ORAL_CAPSULE | Freq: Two times a day (BID) | ORAL | Status: DC

## 2012-04-27 NOTE — Progress Notes (Signed)
Physical Therapy Treatment Patient Details Name: Patricia Farrell MRN: [id number] DOB: 08/12/1965 Today's Date: 04/27/2012 Time: 0830-0900 PT Time Calculation (min): 30 min  PT Assessment / Plan / Recommendation Comments on Treatment Session  Admitted with fever, cough and progressive shortness of breath with resultant VDRF. Activity tolerance continues to make steady progress as well as balance but pt aware to have someone with her when up and moving around in her house for safety given she is still relatively unsteady. Reports she might d/c home today. Sats maintained at 94% on 3 liters throughout session. HR remained 120s and mid teens with activity.     Follow Up Recommendations  Home health PT;Supervision for mobility/OOB     Does the patient have the potential to tolerate intense rehabilitation     Barriers to Discharge        Equipment Recommendations  None recommended by PT    Recommendations for Other Services    Frequency Min 3X/week   Plan Discharge plan remains appropriate;Frequency remains appropriate    Precautions / Restrictions Precautions Precautions: Fall Restrictions Weight Bearing Restrictions: No   Pertinent Vitals/Pain HR 127 following exercises and ambulation, back down to mid teens with rest; SaO2 maintained at or above 90% on 3 liters    Mobility  Bed Mobility Bed Mobility: Not assessed Transfers Transfers: Sit to Stand;Stand to Sit Sit to Stand: 5: Supervision;From bed;With upper extremity assist Stand to Sit: To bed;With upper extremity assist Details for Transfer Assistance: supervision for safety and min sequencing Ambulation/Gait Ambulation/Gait Assistance: 4: Min guard;4: Min assist Ambulation Distance (Feet): 240 Feet Assistive device: None Ambulation/Gait Assistance Details: decreased gait speed with lateral sway, able to increase speed slightly with verbal cues, only occasionally reaching for upper extremity support with occasional minA  for stability assist; ambulated 200 ft with seated rest break to allow HR to come down (HR 120s) and then ambulated another 40 ft after exercises Gait Pattern: Decreased trunk rotation;Decreased stride length Gait velocity: decreased Stairs: No    Exercises General Exercises - Lower Extremity Long Arc Quad: AROM;Both;20 reps;Seated Hip ABduction/ADduction: AROM;Both;10 reps;Standing (unilateral UE assist supported at counter) Hip Flexion/Marching: AROM;Both;20 reps;Seated    PT Goals Acute Rehab PT Goals PT Goal: Sit to Stand - Progress: Progressing toward goal PT Goal: Ambulate - Progress: Progressing toward goal  Visit Information  Last PT Received On: 04/27/12 Assistance Needed: +1    Subjective Data  Subjective: Im ready to go home.    Cognition  Overall Cognitive Status: Appears within functional limits for tasks assessed/performed Arousal/Alertness: Awake/alert Orientation Level: Appears intact for tasks assessed Behavior During Session: Flat affect Cognition - Other Comments: slower processing    Balance     End of Session PT - End of Session Equipment Utilized During Treatment: Gait belt Activity Tolerance: Patient tolerated treatment well Patient left: in bed;with call bell/phone within reach;with family/visitor present Nurse Communication: Mobility status   GP     [facility] HELEN 04/27/2012, 11:20 AM

## 2012-04-27 NOTE — Progress Notes (Signed)
All d/c instructions explained as given to pt, verbalized understanding.  D/C to home; escorted off floor via w/c.  Amanda Pea, Charity fundraiser.

## 2012-04-27 NOTE — Progress Notes (Signed)
SATURATION QUALIFICATIONS: (This note is used to comply with regulatory documentation for home oxygen)  Patient Saturations on Room Air at Rest = 95%  Patient Saturations on Room Air while Ambulating 78%  Patient Saturations on 2 Liters of oxygen while Ambulating = 92%  Please briefly explain why patient needs home oxygen:

## 2012-04-27 NOTE — Progress Notes (Signed)
Internal Medicine Attending  Date: 04/27/2012  Patient name: Patricia Farrell Medical record number: [id number] Date of birth: 08-24-1965 Age: 47 y.o. Gender: female  I saw and evaluated the patient, and discussed her care on a.m. rounds with house staff. I reviewed the resident's note by Dr. Heloise Beecham and I agree with the resident's findings and plans as documented in her note.

## 2012-04-27 NOTE — Progress Notes (Signed)
Subjective: Patient feels stronger today than yesterday. Was able to ambulate with PT yesterday and tolerated walking with 3L [place] oxygen. Saturating well on 3L [place], no hypoxemia. Denies SOB. She is frustrated by inability to sleep in the hospital and anxious to go home.  Denies CP, N/V, dizziness, abdominal pain, diarrhea.   Objective: Vital signs in last 24 hours: Filed Vitals:   04/26/12 1632 04/26/12 2037 04/27/12 0127 04/27/12 0543  BP:  109/69  92/63  Pulse: 111 100  92  Temp:  97 F (36.1 C)  97 F (36.1 C)  TempSrc:  Oral  Oral  Resp:  20  20  Height:      Weight:    150 lb 11.2 oz (68.357 kg)  SpO2: 94% 92% 93% 93%   Weight change: -4.8 oz (-0.136 kg)  Intake/Output Summary (Last 24 hours) at 04/27/12 0833 Last data filed at 04/27/12 0546  Gross per 24 hour  Intake    477 ml  Output   1501 ml  Net  -1024 ml   Vitals reviewed. General: standing up and pacing around room when I enter, NAD HEENT: MMM of OP, Nasal cannula in place Neck: bruising on neck from site prior R IJ line, no hematoma Cardiac: RRR w HR in 90s, no rubs, murmurs or gallops Pulm: good air movement, decreased breath sounds in bilateral bases with some fine crackles, no wheezes or rhonchi Abd: soft, nontender, nondistended, BS present Ext: warm and well perfused, no pedal edema Neuro: alert and oriented X3, sensation and strength intact in bilateral UE and LE  Lab Results: Basic Metabolic Panel:  Lab 04/27/12 1610 04/26/12 0540 04/25/12 0444  NA 137 140 --  K 3.7 3.6 --  CL 95* 97 --  CO2 31 33* --  GLUCOSE 97 133* --  BUN 9 11 --  CREATININE 0.48* 0.52 --  CALCIUM 8.6 8.6 --  MG -- 2.0 2.2  PHOS -- 3.8 2.7   Liver Function Tests:  Lab 04/24/12 0540 04/21/12 0450  AST 55* 31  ALT 64* 48*  ALKPHOS 56 51  BILITOT 0.6 0.1*  PROT 6.0 5.3*  ALBUMIN 2.4* 1.9*   CBC:  Lab 04/27/12 0650 04/23/12 0440  WBC 10.4 8.9  NEUTROABS 7.1 7.3  HGB 10.9* 10.1*  HCT 32.7* 31.2*  MCV 90.8 95.1    PLT 282 271   Cardiac Enzymes:  Lab 04/20/12 1115  CKTOTAL --  CKMB --  CKMBINDEX --  TROPONINI <0.30  CBG:  Lab 04/27/12 0541 04/26/12 2121 04/26/12 1615 04/26/12 1133 04/26/12 0616 04/25/12 2044  GLUCAP 95 118* 159* 140* 134* 150*   Hemoglobin A1C:  Lab 04/21/12 0830  HGBA1C 6.0*   Anemia Panel:  Lab 04/21/12 0830  VITAMINB12 1378*  FOLATE 18.8  FERRITIN 1631*  TIBC 136*  IRON 74  RETICCTPCT 0.7   Urine Drug Screen: Drugs of Abuse     Component Value Date/Time   LABOPIA NONE DETECTED 04/17/2012 1421   COCAINSCRNUR NONE DETECTED 04/17/2012 1421   LABBENZ NONE DETECTED 04/17/2012 1421   AMPHETMU NONE DETECTED 04/17/2012 1421   THCU NONE DETECTED 04/17/2012 1421   LABBARB NONE DETECTED 04/17/2012 1421    Urinalysis:  Lab 04/20/12 2231  COLORURINE YELLOW  LABSPEC 1.010  PHURINE 5.0  GLUCOSEU NEGATIVE  HGBUR NEGATIVE  BILIRUBINUR NEGATIVE  KETONESUR NEGATIVE  PROTEINUR NEGATIVE  UROBILINOGEN 0.2  NITRITE NEGATIVE  LEUKOCYTESUR NEGATIVE   Micro Results: Recent Results (from the past 240 hour(s))  CULTURE, BLOOD (ROUTINE X  2)     Status: Normal   Collection Time   04/17/12 12:50 PM      Component Value Range Status Comment   Specimen Description BLOOD RIGHT ARM   Final    Special Requests BOTTLES DRAWN AEROBIC AND ANAEROBIC 10CC   Final    Culture  Setup Time 04/17/2012 18:03   Final    Culture NO GROWTH 5 DAYS   Final    Report Status 04/23/2012 FINAL   Final   CULTURE, BLOOD (ROUTINE X 2)     Status: Normal   Collection Time   04/17/12 12:55 PM      Component Value Range Status Comment   Specimen Description BLOOD RIGHT HAND   Final    Special Requests BOTTLES DRAWN AEROBIC ONLY 4CC   Final    Culture  Setup Time 04/17/2012 18:03   Final    Culture NO GROWTH 5 DAYS   Final    Report Status 04/23/2012 FINAL   Final   URINE CULTURE     Status: Normal   Collection Time   04/17/12  2:21 PM      Component Value Range Status Comment   Specimen  Description URINE, CLEAN CATCH   Final    Special Requests Normal   Final    Culture  Setup Time 04/17/2012 20:18   Final    Colony Count 40,000 COLONIES/ML   Final    Culture     Final    Value: Multiple bacterial morphotypes present, none predominant. Suggest appropriate recollection if clinically indicated.   Report Status 04/18/2012 FINAL   Final   MRSA PCR SCREENING     Status: Normal   Collection Time   04/19/12 10:27 AM      Component Value Range Status Comment   MRSA by PCR NEGATIVE  NEGATIVE Final   URINE CULTURE     Status: Normal   Collection Time   04/20/12 10:31 PM      Component Value Range Status Comment   Specimen Description URINE, CATHETERIZED   Final    Special Requests NONE   Final    Culture  Setup Time 04/21/2012 06:33   Final    Colony Count NO GROWTH   Final    Culture NO GROWTH   Final    Report Status 04/22/2012 FINAL   Final   CLOSTRIDIUM DIFFICILE BY PCR     Status: Normal   Collection Time   04/21/12  1:41 AM      Component Value Range Status Comment   C difficile by pcr NEGATIVE  NEGATIVE Final   CULTURE, RESPIRATORY     Status: Normal   Collection Time   04/21/12  9:11 AM      Component Value Range Status Comment   Specimen Description TRACHEAL ASPIRATE   Final    Special Requests NONE   Final    Gram Stain     Final    Value: FEW WBC PRESENT, PREDOMINANTLY MONONUCLEAR     FEW SQUAMOUS EPITHELIAL CELLS PRESENT     NO ORGANISMS SEEN   Culture     Final    Value: MODERATE YEAST CONSISTENT WITH CANDIDA SPECIES     FEW CANDIDA ALBICANS   Report Status 04/23/2012 FINAL   Final   RESPIRATORY VIRUS PANEL     Status: Abnormal   Collection Time   04/21/12  5:56 PM      Component Value Range Status Comment   Source - [facility] NASAL  SWAB   Corrected CORRECTED ON 01/17 AT 1112: PREVIOUSLY REPORTED AS NASAL SWAB   Respiratory Syncytial Virus A NOT DETECTED   Final    Respiratory Syncytial Virus B NOT DETECTED   Final    Influenza A DETECTED (*)  Final     Influenza B NOT DETECTED   Final    Parainfluenza 1 NOT DETECTED   Final    Parainfluenza 2 NOT DETECTED   Final    Parainfluenza 3 NOT DETECTED   Final    Metapneumovirus NOT DETECTED   Final    Rhinovirus NOT DETECTED   Final    Adenovirus NOT DETECTED   Final    Influenza A H1 NOT DETECTED   Final    Influenza A H3 NOT DETECTED   Final   CULTURE, RESPIRATORY     Status: Normal   Collection Time   04/22/12  6:18 PM      Component Value Range Status Comment   Specimen Description TRACHEAL ASPIRATE   Final    Special Requests NONE   Final    Gram Stain     Final    Value: RARE WBC PRESENT, PREDOMINANTLY PMN     NO ORGANISMS SEEN   Culture FEW CANDIDA ALBICANS   Final    Report Status 04/25/2012 FINAL   Final   RESPIRATORY VIRUS PANEL     Status: Abnormal   Collection Time   04/22/12  6:36 PM      Component Value Range Status Comment   Source - [facility] TRACHEAL ASPIRATE   Final    Respiratory Syncytial Virus A NOT DETECTED   Final    Respiratory Syncytial Virus B NOT DETECTED   Final    Influenza A DETECTED (*)  Final    Influenza B NOT DETECTED   Final    Parainfluenza 1 NOT DETECTED   Final    Parainfluenza 2 NOT DETECTED   Final    Parainfluenza 3 NOT DETECTED   Final    Metapneumovirus NOT DETECTED   Final    Rhinovirus NOT DETECTED   Final    Adenovirus NOT DETECTED   Final    Influenza A H1 NOT DETECTED   Final    Influenza A H3 NOT DETECTED   Final    Studies/Results: No results found. Medications: I have reviewed the patient's current medications. Scheduled Meds:    . antiseptic oral rinse  15 mL Mouth Rinse BID  . citalopram  40 mg Oral Daily  . enoxaparin (LOVENOX) injection  40 mg Subcutaneous Q24H  . feeding supplement  237 mL Oral Q24H  . insulin aspart  0-5 Units Subcutaneous QHS  . insulin aspart  0-9 Units Subcutaneous TID WC  . levalbuterol  1.25 mg Nebulization Q6H   And  . ipratropium  0.5 mg Nebulization Q6H  . levofloxacin (LEVAQUIN) IV  750 mg  Intravenous Q24H  . multivitamin with minerals  1 tablet Oral Daily  . nicotine  21 mg Transdermal Daily  . oseltamivir  150 mg Oral BID  . predniSONE  40 mg Oral Q breakfast   Continuous Infusions:    . sodium chloride 20 mL/hr at 04/20/12 2000   PRN Meds:.acetaminophen, ipratropium, levalbuterol, LORazepam, ondansetron, ondansetron  Assessment/Plan: Patient is a 47 year old female transferred back to [place] yesterday from the ICUafter being treated for flu, sepsis 2/2 strep pneumo bronchopneumonia and severe ARDS.  Influenza A complicated by Strep pneumo bronchopneumonia   ARDS/sepsis has resolve. Patient  extubated on 1/17.  Vancomycin and Cefepime discontinued on 1/17. She continues on levaquin and tamiflu. Blood cultures NGTD final. Sputum/repiratory culture few Candida Albicans. Patient is maintaining adequate oxygen saturations on nasal cannula. Tolerating PT and ambulation well. PT has recommended home health PT.  -Continue Atrovent and Xopenex PRN  -taper steroids, prednisone 30mg  x3 days, 20mg  x3 days, 10mg  x3 days -continue Tamiflu (day 8/10) and Levaquin (day 8/10)  - Home health for PT, home O2 for acute illness  Diarrhea  Resolved. Had diarrhea in ICU. Stool C.diff PCR negative. No complaints of diarrhea today. -Continue Imodium PRN   Anxiety/Depression  Patient reports some baseline anxiety/agitation. Treated for ICU delirium w precedex drip which was d/c'd yesterday. Agitation likely worsened by steroid use. 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 Pt w modertae EtOH use in past, but has been in hospital 9 days now w/o drink. Does not like nicotene patch. Trouble sleeping in hospital environment and anxious to go home.  -Continue Celexa  -ativan PRN   COPD Patient likely w underlying COPD. Has never used home inhalers. No wheezing on my exam this mornig. - Continue nebs - Will need PFTs as outpatient after acute illness resolves   DVT prophylaxis:  Lovenox   Dispo: Hopefully today with home health The patient does not have a current PCP (No primary provider on file.), therefore will be requiring OPC follow-up after discharge.   The patient does not have transportation limitations that hinder transportation to clinic appointments.  .Services Needed at time of discharge: Y = Yes, Blank = No PT: Y  OT:   RN:   Equipment: Home O2  Other:     LOS: 10 days   Bronson Curb 04/27/2012, 8:33 AM

## 2012-04-27 NOTE — Discharge Summary (Signed)
Internal Medicine Teaching [facility] Discharge Note  Name: Patricia Farrell MRN: [id number] DOB: 1965/09/12 47 y.o.  Date of Admission: 04/17/2012 11:24 AM Date of Discharge: 04/27/2012 Attending Physician: Farley Ly, MD  Discharge Diagnosis: 1) Influenza A with secondary Strep. pneumo pneumonia, complicated by ARDS and sepsis 2) ICU delerium 3) Diarrhea 4) Anxiety/Depression 5) COPD 6) Hypovolemic Hyponatremia  7) Anion gap metabolic acidosis  8) Transaminitis  9) Hypokalemia   Discharge Medications:   Medication List     As of 04/27/2012 11:25 AM    STOP taking these medications         dextromethorphan-guaiFENesin 30-600 MG per 12 hr tablet   Commonly known as: MUCINEX DM      NYQUIL PO      TAKE these medications         acetaminophen 500 MG tablet   Commonly known as: TYLENOL   Take 1,000 mg by mouth every 6 (six) hours as needed. For pain.      albuterol (2.5 MG/3ML) 0.083% nebulizer solution   Commonly known as: PROVENTIL   Take 3 mLs (2.5 mg total) by nebulization every 6 (six) hours as needed for wheezing.      aspirin EC 81 MG tablet   Take 81 mg by mouth daily.      calcium carbonate 1250 MG tablet   Commonly known as: OS-CAL - dosed in mg of elemental calcium   Take 1 tablet by mouth daily.      citalopram 40 MG tablet   Commonly known as: CELEXA   Take 40 mg by mouth daily. May take up to 2 additional doses per day if needed for excessive anxiety        fish oil-omega-3 fatty acids 1000 MG capsule   Take 2 g by mouth daily.      ipratropium 0.02 % nebulizer solution   Commonly known as: ATROVENT   Take 2.5 mLs (500 mcg total) by nebulization every 6 (six) hours as needed for wheezing.      levofloxacin 750 MG tablet   Commonly known as: LEVAQUIN   Take 1 tablet (750 mg total) by mouth daily.   Start taking on: 04/28/2012      multivitamins ther. w/minerals Tabs   Take 1 tablet by mouth daily.      oseltamivir 75 MG capsule    Commonly known as: TAMIFLU   Take 2 capsules (150 mg total) by mouth 2 (two) times daily.      predniSONE 10 MG tablet   Commonly known as: DELTASONE   Take 3 tablets daily for 2 days, 2 tablets daily for 3 days, 1 tablets daily for 3 days,         Disposition and follow-up:   [name] was discharged from [facility] in stable condition.  At the hospital follow up visit please address  1) Recovery from PNA/Flu Patient hospitalized for Influenza A with secondary bacterial PNA complicated by ARDS and sepsis. Was discharged on home oxygen. Please assess patient's respiratory status and continued requirement for oxygen. She will likely need to be on supplemental O2 for at least a couple of weeks. Will need a CXR in 6 weeks to follow-up resolution of disease.  2) COPD Patient will need PFTs when acute illness resolves. Was discharged home w albuterol/ipratropium nebs. Please assess frequency of neb treatments at home, prescribe inhalers if warranted.  3) Tobacco abuse Please continue to counsel for smoking cessation, and again  remind patient of danger of using oxygen therapy while smoking.    Follow-up Appointments: Follow-up Information    Follow up with Denton Ar, MD. On 04/30/2012. (Please come to your appointment at 1:30)    Contact information:   [facility] [address] [place] [address] [phone]         Discharge Orders    Future Appointments: Provider: Department: Dept Phone: Center:   04/30/2012 1:45 PM Larey Seat, MD [facility] [phone] [facility]     Future Orders Please Complete By Expires   Diet - low sodium heart healthy      Increase activity slowly      Call MD for:  temperature >100.4      Call MD for:  redness, tenderness, or signs of infection (pain, swelling, redness, odor or green/yellow discharge around incision site)      Call MD for:  difficulty  breathing, headache or visual disturbances      Call MD for:  persistant dizziness or light-headedness         Consultations:    Procedures Performed:  Dg Chest 2 View  04/18/2012  *RADIOLOGY REPORT*  Clinical Data: Pneumonia  CHEST - 2 VIEW  Comparison: 04/17/2012  Findings: Worsening ill-defined patchy airspace process throughout the left lung and left lower lobe compatible with multilobar pneumonia.  Stable heart size and vascularity.  No significant effusion.  No pneumothorax.  IMPRESSION: Worsening asymmetric bilateral airspace process compatible with pneumonia.   Original Report Authenticated By: Judie Petit. Miles Costain, M.D.    Dg Chest Port 1 View  04/23/2012  *RADIOLOGY REPORT*  Clinical Data: Evaluate ET tube placement  PORTABLE CHEST - 1 VIEW  Comparison: 04/22/2012  Findings: ET tube tip is above the carina.  There is a right IJ catheter with tip in the projection the SVC.  Nasogastric tube is in place.  Lung volumes are low.  There is persistent bilateral lower lobe airspace opacities.  This is not significantly changed from previous exam.  IMPRESSION:  1.  No change in bibasilar airspace opacities.   Original Report Authenticated By: Signa Kell, M.D.    Dg Chest Port 1 View  04/22/2012  *RADIOLOGY REPORT*  Clinical Data: Serial follow up.  ARDS.  PORTABLE CHEST - 1 VIEW  Comparison: 04/21/2012.  Findings: Endotracheal tube, enteric tube and right IJ central line appear unchanged.  Reverse lordotic projection.  Bilateral basilar left greater than right airspace disease, atelectasis and effusions appear unchanged.  Visualized portions of the cardiopericardial silhouette appear unchanged.  IMPRESSION:  1.  Stable support apparatus. 2.  No interval change in basilar airspace disease and effusions.   Original Report Authenticated By: Andreas Newport, M.D.    Dg Chest Port 1 View  04/21/2012  *RADIOLOGY REPORT*  Clinical Data: Endotracheal tube check  PORTABLE CHEST - 1 VIEW  Comparison: Chest  radiograph 04/20/2012  Findings: Endotracheal tube, NG tube, and right central venous line are unchanged.  Stable cardiac silhouette.  There is bilateral diffuse air space disease and pleural effusions unchanged from prior. Left basilar atelectasis.  No pneumothorax.  IMPRESSION:  1.  Stable support apparatus. No significant change.  2.  Stable bilateral air space disease. 3.  Bilateral pleural effusions and left basilar atelectasis.   Original Report Authenticated By: Genevive Bi, M.D.    Dg Chest Port 1 View  04/20/2012  *RADIOLOGY REPORT*  Clinical Data: Endotracheal tube placement  PORTABLE CHEST - 1 VIEW  Comparison: Portable exam 0546 hours compared to 04/19/2012  Findings: Tip of endotracheal tube projects 2.4 cm above carina. Nasogastric tube extends into stomach. Right jugular central venous catheter tip projects over SVC. Stable heart size and mediastinal contours. Bilateral airspace infiltrates asymmetrically greater on the left, little changed. No gross pleural effusion or pneumothorax.  IMPRESSION: No significant interval change in bilateral airspace infiltrates asymmetrically greater on left.   Original Report Authenticated By: Ulyses Southward, M.D.    Dg Chest Port 1 View  04/19/2012  *RADIOLOGY REPORT*  Clinical Data: Intubated.  PORTABLE CHEST - 1 VIEW  Comparison: Earlier film of the same day  Findings: Endotracheal tube tip repositioned approximately 2.8 cm above carina.  Right IJ central line remains in the proximal SVC. Nasogastric tube extends at least as far as the stomach, which is now decompressed.  There are diffuse alveolar opacities throughout both lungs, left greater than right, without convincing change compared to prior study.  The most dense consolidation is the in the left lung base.  Suspect small bilateral pleural effusions. Heart size upper limits normal for technique.  IMPRESSION: 1.  Endotracheal tube repositioning, tip 2.8 cm above carina. 2.  Nasogastric tube into  decompressed stomach.   Original Report Authenticated By: D. Andria Rhein, MD    Dg Chest Port 1 View  04/19/2012  *RADIOLOGY REPORT*  Clinical Data: Intubation  PORTABLE CHEST - 1 VIEW  Comparison: 1558 hours  Findings: Tip of the endotracheal tube remains in the right mainstem bronchus.  This should be retracted at least 3 cm. Improving bilateral airspace disease.  Otherwise stable exam.  IMPRESSION: Endotracheal tube tip remains in the right mainstem bronchus.  This should be retracted 3 cm.  Improving bilateral airspace disease.   Original Report Authenticated By: Jolaine Click, M.D.    Dg Chest Port 1 View  04/19/2012  *RADIOLOGY REPORT*  Clinical Data: Post intubation and line placement  PORTABLE CHEST - 1 VIEW  Comparison: Earlier same day; 04/18/2012; 04/17/2012  Findings:  Interval intubation with endotracheal tube tip overlying the right mainstem bronchus.  Interval placement of right jugular approach central venous catheter with tip overlying the mid/distal SVC.  Bilateral heterogeneous air space opacities have worsened in the interval, in particular within the left mid and lower lung.  Small bilateral effusions are suspected, left greater than right.  No pneumothorax.  Unchanged bones.  IMPRESSION: 1.  Endotracheal tube tip overlies the right mainstem bronchus. Retraction approximately 4.2 cm is recommended.  No pneumothorax. 2.  Right jugular approach central venous catheter tip overlies the mid/distal SVC. 3.  Worsening bilateral air space opacities worrisome for progression of multifocal infection.  Above findings discussed with Cedric Fishman, RN at [phone].   Original Report Authenticated By: Tacey Ruiz, MD    Dg Chest Port 1 View  04/19/2012  *RADIOLOGY REPORT*  Clinical Data: Pneumonia, shortness of breath, history smoking, hypertension  CHEST - 1 VIEW  Comparison: Portable exam 1223 hours compared to 04/18/2012  Findings: Stable heart size mediastinal contours. Patchy bilateral airspace infiltrates  left greater than right increased since previous exam. Right basilar atelectasis and small pleural effusion identified. No definite pneumothorax or acute osseous findings.  IMPRESSION: Bilateral airspace infiltrates consistent with pneumonia, left greater than right, increased since previous exam. Small right pleural effusion and right basilar atelectasis.   Original Report Authenticated By: Ulyses Southward, M.D.    Dg Chest Portable 1 View  04/17/2012  *RADIOLOGY REPORT*  Clinical Data: Shortness of breath.  Cough  and fever.  PORTABLE CHEST - 1 VIEW  Comparison: Chest x-ray 09/10/2005.  Findings: Lung volumes are low and there are patchy ill-defined opacities throughout the periphery of the lung bases bilaterally, which could represent areas of early airspace consolidation.  Mild diffuse peribronchial cuffing.  No definite pleural effusions.  No evidence of pulmonary edema.  Heart size is normal.  Mediastinal contours are unremarkable.  IMPRESSION: 1.  Findings, as above, concerning for bronchitis, potentially with developing bronchopneumonia.  Clinical correlation is recommended.   Original Report Authenticated By: Trudie Reed, M.D.    Dg Abd Portable 1v  04/19/2012  *RADIOLOGY REPORT*  Clinical Data: Orogastric tube placement.  PORTABLE ABDOMEN - 1 VIEW  Comparison: None.  Findings: The tip of the orogastric tube is in the gastric antrum. Normal bowel gas pattern.  The upper abdomen is excluded.  Minimal spurring in the lumbar spine.  IMPRESSION:  Orogastric tube passage to the gastric antrum.   Original Report Authenticated By: D. Andria Rhein, MD    Admission HPI:  Ms. Bridgett is a 47 year old female with past medical history of depression presenting with 5 days complaints of fever, chills, body aches and 3-4 days of nonproductive cough and shortness of breath.  Ms. Kennington reports that 5 days ago she started noting scratchy throat and diffuse body aches as well as some subjective fever and chills. Her  boyfriend at home and they're other roommates were ill with similar symptoms. Over the past 3-4 days, she's developed a persistent, nonproductive cough and shortness of breath. She has also noted 3 days of loose, nonbloody stools. She has had decreased appetite and has not had much to eat or drink by mouth for the past few days. She also endorses bilateral headache over her temples over the past 3 days. She is feeling generally weaker than usual, but has no focal weakness.  She has a 30-40 pack year smoking history, but denies any chronic lung disease her home inhaler use. She's not tried any over-the-counter medications. She's never had a similar illness in the past.  She has missed several days of work due to this acute illness.   Hospital Course by problem list: 1) Influenza A with secondary Strep. pneumo pneumonia, complicated by ARDS and sepsis Patient presented with 5 days symptoms of flu-like illness: myalgias, fevers, chills, cough. On admission, flu PCR was negative and CXR showed concern for early bronchopneumonia. At time of presentation shew as tachycardic, febrile to 103, and requiring supplemental oxygen by nasal cannula to maintain adequate saturation. She did not have a leukocytosis but did have a left shift (88% PMNs). She had stable BPs and normal lactic acid.  We admitted her to telemetry and started therapy with IV levaquin for community acquired pneumonia. The following day, she was ambulating much easier but did have prominent wheezing on exam. In setting of heavy smoking history, we suspected underlying component of COPD. We started on oral prednisone therapy. On hospital day 3, a rapid response was called as patient was noted to be in respiratory distress and saturating 70% on nasal cannula. She was put on non-rebreather facemask and saturations improved, but she appeared very fatigued from increased WOB. That morning, urinary strep pneumo antigen returned positive. Antimicrobial  coverage was changed from levaquin to ceftriaxone and azithromycin. Steroids changed to IV. Blood gas revealed pO2 33.9 with alkalotic pH. Critical care was called and patient was transferred to the ICU. Bipap therapy was started, but patient's oxygen demand continued to increase and  she was intubated on 04/19/12. On Ceftriaxone/azithro were discontinued and she was treated with vancomycin, cefepime, levaquin and oseltamivir in the ICU. Intubation was prolonged by interval development of ARDS and sepsis. She was managed per ARDS protocol in the ICU and required several days of paralysis. She did not require pressors. Respiratory virus panel returned positive for Influenza A.  She remained intubated for 5 days, and was extubated on 04/24/11. Vancomycin and Cefepime discontinued on day of extubation (4 days total). Blood cultures were NGTD (final).  She was transferred to the med-surg floor on 04/25/12. Her oxygenation remained stable on nasal cannula. She was able to ambulate with physical therapy while on nasal cannula. PT recommended home PT therapy for her.  On day of discharge, she was able to maintain oxygen saturation of 95% on room air at rest, but dropped to 78% with ambulation. She was discharged on home oxygen, as well as 2 additional days of levaquin and tamiflu therapy to complete 10 day course of both. She was scheduled for a follow-up appointment in the outpatient clinic 05/01/11.  She should have follow-up chest xray in about 6 weeks to monitor for resolution of PNA.   2) ICU delirium Noted to be agitated and disoriented 1/18-1/19 in the ICU. Treated with precedex drip for less than 24 hours and symptoms resolved.  3) Diarrhea Developed diarrhea in the ICU. C. Diff PCR was negative. Was treated with immodium. Symptoms resolved prior to transfer to the floor.   4) Anxiety/Depression Patient presented with some baseline anxiety/agitation. Suspect agitation acutely worsened during hospitalization  by steroids and nicotine withdrawal; possibly mild alcohol withdrawal, no withdrawal seizures or DTs. Received BID prn ativan after transfer out of the ICU. Refused nicotine patch.   5) COPD Suspect underlying COPD in setting of long term smoking history and prominent wheezing on examination. Was treated with prn ipratropium and albuterol nebulizers in addition to steroid therapy. She will be discharged on a prednisone taper. She should have PFTs as an outpatient when acute issues resolve.   6) Hypovolemic Hyponatremia  Presented with hypovolemic hyponatremia (sodium is 130). Likely result of acute lung disease, may represent extrarenal losses as she has complained of diarrhea and appeared hypovolemic. Resolved w IVF administration.  7) Anion gap metabolic acidosis  Patient had anion gap metabolic acidosis with gap of 16 on admission. Delta delta ratio is a little greater than 1, probable pure acidosis. Also with ketonuria. Normal lactate, no diagnosis of diabetes. Denied any recent alcohol use or other ingestions. Suspect starvation ketoacidosis in setting of minimal po intake over several days. Resolved with IVF and po intake.   8) Transaminitis  Patient noted on labs to have AST 200 ALT 130. She does not have elevation of her alkaline phosphatase or bilirubin. No prior labs for comparison. HIV and hepatitis labs negative. Transaminases downtrended and were AST 55 ALT 64 at time of discharge. Suspect acute elevation caused by influenza virus.   9) Hypokalemia  Potassium was 3.2 on admission. No prior for comparison. Probably low in setting of acute illness or related to malnutrition. Repleted IV.   Discharge Vitals:  BP 92/63  Pulse 120  Temp 97 F (36.1 C) (Oral)  Resp 20  Ht 5\' 3"  (1.6 m)  Wt 150 lb 11.2 oz (68.357 kg)  BMI 26.70 kg/m2  SpO2 93%  Discharge Labs:  Results for orders placed during the hospital encounter of 04/17/12 (from the past 24 hour(s))  GLUCOSE, CAPILLARY  Status: Abnormal   Collection Time   04/26/12 11:33 AM      Component Value Range   Glucose-Capillary 140 (*) 70 - 99 mg/dL  GLUCOSE, CAPILLARY     Status: Abnormal   Collection Time   04/26/12  4:15 PM      Component Value Range   Glucose-Capillary 159 (*) 70 - 99 mg/dL   Comment 1 Notify RN     Comment 2 Documented in Chart    GLUCOSE, CAPILLARY     Status: Abnormal   Collection Time   04/26/12  9:21 PM      Component Value Range   Glucose-Capillary 118 (*) 70 - 99 mg/dL   Comment 1 Notify RN     Comment 2 Documented in Chart    GLUCOSE, CAPILLARY     Status: Normal   Collection Time   04/27/12  5:41 AM      Component Value Range   Glucose-Capillary 95  70 - 99 mg/dL  CBC WITH DIFFERENTIAL     Status: Abnormal   Collection Time   04/27/12  6:50 AM      Component Value Range   WBC 10.4  4.0 - 10.5 K/uL   RBC 3.60 (*) 3.87 - 5.11 MIL/uL   Hemoglobin 10.9 (*) 12.0 - 15.0 g/dL   HCT 16.1 (*) 09.6 - 04.5 %   MCV 90.8  78.0 - 100.0 fL   MCH 30.3  26.0 - 34.0 pg   MCHC 33.3  30.0 - 36.0 g/dL   RDW 40.9  81.1 - 91.4 %   Platelets 282  150 - 400 K/uL   Neutrophils Relative 69  43 - 77 %   Neutro Abs 7.1  1.7 - 7.7 K/uL   Lymphocytes Relative 21  12 - 46 %   Lymphs Abs 2.2  0.7 - 4.0 K/uL   Monocytes Relative 9  3 - 12 %   Monocytes Absolute 1.0  0.1 - 1.0 K/uL   Eosinophils Relative 1  0 - 5 %   Eosinophils Absolute 0.1  0.0 - 0.7 K/uL   Basophils Relative 0  0 - 1 %   Basophils Absolute 0.0  0.0 - 0.1 K/uL  BASIC METABOLIC PANEL     Status: Abnormal   Collection Time   04/27/12  6:50 AM      Component Value Range   Sodium 137  135 - 145 mEq/L   Potassium 3.7  3.5 - 5.1 mEq/L   Chloride 95 (*) 96 - 112 mEq/L   CO2 31  19 - 32 mEq/L   Glucose, Bld 97  70 - 99 mg/dL   BUN 9  6 - 23 mg/dL   Creatinine, Ser 7.82 (*) 0.50 - 1.10 mg/dL   Calcium 8.6  8.4 - 95.6 mg/dL   GFR calc non Af Amer >90  >90 mL/min   GFR calc Af Amer >90  >90 mL/min    Signed: Bronson Curb 04/27/2012, 11:25 AM   Time Spent on Discharge: 35 mion Services Ordered on Discharge: Home Health PT Equipment Ordered on Discharge: oxygen, nebulizer

## 2012-04-28 NOTE — Progress Notes (Signed)
04/27/12 1230 Pt. ordered for home oxygen 2lpm [place].  RN to walk pt. with oxygen and obtain a R/A sat.  [place], with [facility] to deliever oxygen today prior to dc.  Pt. will dc home today. Tera Mater, RN, BSN NCM [phone]

## 2012-04-30 ENCOUNTER — Ambulatory Visit (INDEPENDENT_AMBULATORY_CARE_PROVIDER_SITE_OTHER): Payer: Self-pay | Admitting: Internal Medicine

## 2012-04-30 ENCOUNTER — Encounter: Payer: Self-pay | Admitting: Internal Medicine

## 2012-04-30 VITALS — BP 124/89 | HR 105 | Temp 96.8°F | Ht 63.0 in | Wt 150.1 lb

## 2012-04-30 DIAGNOSIS — B3781 Candidal esophagitis: Secondary | ICD-10-CM | POA: Insufficient documentation

## 2012-04-30 DIAGNOSIS — J9600 Acute respiratory failure, unspecified whether with hypoxia or hypercapnia: Secondary | ICD-10-CM

## 2012-04-30 HISTORY — DX: Candidal esophagitis: B37.81

## 2012-04-30 MED ORDER — FLUCONAZOLE 100 MG PO TABS: 200.0000 mg | ORAL_TABLET | Freq: Every day | ORAL | Status: DC

## 2012-04-30 NOTE — Progress Notes (Signed)
Internal Medicine Clinic Visit    HPI:  Patricia Farrell is a 47 y.o. year old female who presents for hospital followup. She was admitted on 04/17/2012 with ARDS and sepsis secondary to influenza/strep pneumo pneumonia. She was intubated during her hospitalization and had a prolonged recovery course. She was sent home on 2 L of supplemental oxygen which she has been using.  Patient states that she has been doing well since discharge. Her breathing has much improved and she only has mild shortness of breath with exertion. She has used her oxygen almost continuously. She has not smoked while using oxygen. In fact, patient has not used any cigarettes since discharge. She has been using electronic cigarettes only.  Patient has been using her prescribed nebulizers twice per day. She feels it may help her breathing a lot. She also does insist his from injury at home.  Patient has a new complaint of sore throat and decreased appetite. She states that she is hungry, but is unable to eat because everything hurts her throat when she swallows. Worse with solids, better with liquids. Pain with swallowing. She is also complaining of having painful white sores on her tongue in the back of her throat.  She denies fever, chills, chest pain, cough, sputum production, altered mental status.     Past Medical History  Diagnosis Date  . Depression   . Headache   . Acute bronchopneumonia 04/17/2012  . COPD (chronic obstructive pulmonary disease) 04/19/2012    Past Surgical History  Procedure Date  . Tubal ligation   . Abdominal hysterectomy      ROS:  A complete review of systems was otherwise negative, except as noted in the HPI.  Allergies: Review of patient's allergies indicates no known allergies.  Medications: Current Outpatient Prescriptions  Medication Sig Dispense Refill  . acetaminophen (TYLENOL) 500 MG tablet Take 1,000 mg by mouth every 6 (six) hours as needed. For pain.      Marland Kitchen albuterol  (PROVENTIL) (2.5 MG/3ML) 0.083% nebulizer solution Take 3 mLs (2.5 mg total) by nebulization every 6 (six) hours as needed for wheezing.  75 mL  12  . aspirin EC 81 MG tablet Take 81 mg by mouth daily.        . calcium carbonate (OS-CAL - DOSED IN MG OF ELEMENTAL CALCIUM) 1250 MG tablet Take 1 tablet by mouth daily.        . citalopram (CELEXA) 40 MG tablet Take 40 mg by mouth daily. May take up to 2 additional doses per day if needed for excessive anxiety        . fish oil-omega-3 fatty acids 1000 MG capsule Take 2 g by mouth daily.        Marland Kitchen ipratropium (ATROVENT) 0.02 % nebulizer solution Take 2.5 mLs (500 mcg total) by nebulization every 6 (six) hours as needed for wheezing.  75 mL  12  . levofloxacin (LEVAQUIN) 750 MG tablet Take 1 tablet (750 mg total) by mouth daily.  2 tablet  0  . Multiple Vitamins-Minerals (MULTIVITAMINS THER. W/MINERALS) TABS Take 1 tablet by mouth daily.        Marland Kitchen oseltamivir (TAMIFLU) 75 MG capsule Take 2 capsules (150 mg total) by mouth 2 (two) times daily.  10 capsule  0  . predniSONE (DELTASONE) 10 MG tablet Take 3 tablets daily for 2 days, 2 tablets daily for 3 days, 1 tablets daily for 3 days,  15 tablet  0    History   Social History  .  Marital Status: Legally Separated    Spouse Name: N/A    Number of Children: N/A  . Years of Education: N/A   Occupational History  . Not on file.   Social History Main Topics  . Smoking status: Former Smoker -- 1.0 packs/day for 30 years    Types: Cigarettes    Quit date: 04/13/2012  . Smokeless tobacco: Former Neurosurgeon    Quit date: 04/13/2012  . Alcohol Use: Yes     Comment: Denies current heavy EtOH, says a few drinks per week. Endorses prior heavy use.   . Drug Use: No     Comment: Patient daughter in room and patient distressed. Will discuss drug use at later evaluation.  . Sexually Active: No   Other Topics Concern  . Not on file   Social History Narrative   Works at [employer].     family history is  not on file.  Physical Exam Blood pressure 124/89, pulse 105, temperature 96.8 F (36 C), temperature source Oral, height 5\' 3"  (1.6 m), weight 150 lb 1.6 oz (68.085 kg), SpO2 96.00%. General:  No acute distress, alert and oriented x 3 HEENT:  PERRL, EOMI, moist mucous membranes, numerous white lesions with discrete borders scattered throughout, more numerous towards the back of the throat. Oropharynx otherwise clear, no exudate. Nasal cannula in place. Cardiovascular:  Regular rate and rhythm, no murmurs, rubs or gallops Respiratory:  Clear to auscultation bilaterally, no wheezes, rales, or rhonchi, good effort, Abdomen:  Soft, nondistended, nontender, positive bowel sounds Extremities:  Warm and well-perfused, no clubbing, cyanosis, or edema.  Skin: Warm, dry, no rashes Neuro: Not anxious appearing, no depressed mood, normal affect  Labs: Lab Results  Component Value Date   CREATININE 0.48* 04/27/2012   BUN 9 04/27/2012   NA 137 04/27/2012   K 3.7 04/27/2012   CL 95* 04/27/2012   CO2 31 04/27/2012   Lab Results  Component Value Date   WBC 10.4 04/27/2012   HGB 10.9* 04/27/2012   HCT 32.7* 04/27/2012   MCV 90.8 04/27/2012   PLT 282 04/27/2012      Assessment and Plan:    FOLLOWUP: Patricia Farrell will follow back up in our clinic in approximately 2-3 weeks. Patricia Farrell knows to call out clinic in the meantime with any questions or new issues.   Patient was seen and evaluated by Denton Ar, MD,  and Debe Coder, MD Attending Physician.

## 2012-04-30 NOTE — Patient Instructions (Signed)
Please return to clinic in 2-3 weeks

## 2012-04-30 NOTE — Progress Notes (Signed)
PSO2 via walking with 2LO2 via cannula -94-96%. Stanton Kidney Saryna Kneeland RN 04/30/12 2:50PM

## 2012-05-04 ENCOUNTER — Telehealth: Payer: Self-pay | Admitting: *Deleted

## 2012-05-04 NOTE — Telephone Encounter (Addendum)
Pt calls and states she needs letter for work with explanation of why she has been out of work, hospitalized and when she can go back to work. It needs to be addressed to: [employer] , attn: [email or web address]. Fax # [phone]  PH# is same.

## 2012-05-05 NOTE — Assessment & Plan Note (Signed)
Patient presents after prolonged hospital stay for flu and ARDS during which she was intubated. She was sent home with 2 L of supplemental oxygen which she has been using without difficulty. patient has actually quit smoking. Patient was walked with oxygen today and she did not have any hypoxemia.  -Continue home oxygen -Continue nebulizers -Turn to clinic in 2 weeks to reevaluate and potentially switch to inhalers

## 2012-05-05 NOTE — Assessment & Plan Note (Signed)
Patient presents after hospitalization and multiple antibiotic use with white lesions on tongue and odynophagia. Exam consistent with candida infection and concern for esophageal lesions given her significant pain with swallowing.  -Diflucan 200 mg daily with followup in 2 weeks to follow progress -Likely need to treat for 2-4 weeks

## 2012-05-06 ENCOUNTER — Telehealth: Payer: Self-pay | Admitting: *Deleted

## 2012-05-06 NOTE — Telephone Encounter (Signed)
Letter as requested by pt  faxed to [employer]. [phone]) per Dr Collier Bullock.

## 2012-05-17 ENCOUNTER — Encounter: Payer: Self-pay | Admitting: Internal Medicine

## 2012-05-17 ENCOUNTER — Ambulatory Visit (INDEPENDENT_AMBULATORY_CARE_PROVIDER_SITE_OTHER): Payer: Self-pay | Admitting: Internal Medicine

## 2012-05-17 VITALS — BP 133/93 | HR 106 | Temp 97.4°F | Wt 153.6 lb

## 2012-05-17 DIAGNOSIS — Z579 Occupational exposure to unspecified risk factor: Secondary | ICD-10-CM | POA: Insufficient documentation

## 2012-05-17 DIAGNOSIS — Z569 Unspecified problems related to employment: Secondary | ICD-10-CM

## 2012-05-17 DIAGNOSIS — F329 Major depressive disorder, single episode, unspecified: Secondary | ICD-10-CM

## 2012-05-17 DIAGNOSIS — J9600 Acute respiratory failure, unspecified whether with hypoxia or hypercapnia: Secondary | ICD-10-CM

## 2012-05-17 DIAGNOSIS — J449 Chronic obstructive pulmonary disease, unspecified: Secondary | ICD-10-CM

## 2012-05-17 DIAGNOSIS — B3781 Candidal esophagitis: Secondary | ICD-10-CM

## 2012-05-17 DIAGNOSIS — F17200 Nicotine dependence, unspecified, uncomplicated: Secondary | ICD-10-CM

## 2012-05-17 DIAGNOSIS — Z720 Tobacco use: Secondary | ICD-10-CM | POA: Insufficient documentation

## 2012-05-17 DIAGNOSIS — Z0000 Encounter for general adult medical examination without abnormal findings: Secondary | ICD-10-CM

## 2012-05-17 MED ORDER — CITALOPRAM HYDROBROMIDE 40 MG PO TABS: 40.0000 mg | ORAL_TABLET | Freq: Every day | ORAL | Status: DC

## 2012-05-17 MED ORDER — ACETAMINOPHEN 500 MG PO TABS: 500.0000 mg | ORAL_TABLET | Freq: Four times a day (QID) | ORAL | Status: DC | PRN

## 2012-05-17 NOTE — Assessment & Plan Note (Signed)
-  will send referral for mammogram

## 2012-05-17 NOTE — Assessment & Plan Note (Signed)
Patient continues to smoke regular cigarettes as well as electronic cigarettes. She is very concerned about her occupational exposures at work, but we discussed that cigarette smoke exposure is one of the worst things for her lungs. I encouraged her to continue her efforts at smoking cessation, offered her a nicotine patch or gum which she declined.  -Followup with smoking cessation at next visit

## 2012-05-17 NOTE — Progress Notes (Signed)
Internal Medicine Clinic Visit    HPI:  Patricia Farrell is a 47 y.o. year old female who presents for followup. She was admitted on 04/17/2012 with ARDS and sepsis secondary to influenza/strep pneumo pneumonia. She was intubated during her hospitalization and had a prolonged recovery course.   She reports doing better since her last follow up appointment. Her breathing continues to improve. She uses O2 at home only PRN with activity or if she gets short of breath. She is using her nebulizers less frequently, last use was 2 days ago.  Patient states that she has started smoking again. She uses some electronic cigarettes and about 1/2 pack regular cigarettes daily.  She has been taking diflucan as prescribes as states that her odynophagia has resolved, she is now back to eating and drinking without pain.  No fever or chills. Has mild cough, started last 2 days when started smoking again.  Patient has not been back at work but states she is ready to go back. She asks for a letter for going back to work with a mask for protection from the fumes.   Past Medical History  Diagnosis Date  . Depression   . Headache   . Acute bronchopneumonia 04/17/2012  . COPD (chronic obstructive pulmonary disease) 04/19/2012    Past Surgical History  Procedure Laterality Date  . Tubal ligation    . Abdominal hysterectomy       ROS:  A complete review of systems was otherwise negative, except as noted in the HPI.  Allergies: Review of patient's allergies indicates no known allergies.  Medications: Current Outpatient Prescriptions  Medication Sig Dispense Refill  . acetaminophen (TYLENOL) 500 MG tablet Take 1,000 mg by mouth every 6 (six) hours as needed. For pain.      Marland Kitchen albuterol (PROVENTIL) (2.5 MG/3ML) 0.083% nebulizer solution Take 3 mLs (2.5 mg total) by nebulization every 6 (six) hours as needed for wheezing.  75 mL  12  . aspirin EC 81 MG tablet Take 81 mg by mouth daily.        . calcium  carbonate (OS-CAL - DOSED IN MG OF ELEMENTAL CALCIUM) 1250 MG tablet Take 1 tablet by mouth daily.        . citalopram (CELEXA) 40 MG tablet Take 40 mg by mouth daily. May take up to 2 additional doses per day if needed for excessive anxiety        . fish oil-omega-3 fatty acids 1000 MG capsule Take 2 g by mouth daily.        . fluconazole (DIFLUCAN) 100 MG tablet Take 2 tablets (200 mg total) by mouth daily. For three weeks  60 tablet  0  . ipratropium (ATROVENT) 0.02 % nebulizer solution Take 2.5 mLs (500 mcg total) by nebulization every 6 (six) hours as needed for wheezing.  75 mL  12  . Multiple Vitamins-Minerals (MULTIVITAMINS THER. W/MINERALS) TABS Take 1 tablet by mouth daily.        Marland Kitchen oseltamivir (TAMIFLU) 75 MG capsule Take 2 capsules (150 mg total) by mouth 2 (two) times daily.  10 capsule  0  . predniSONE (DELTASONE) 10 MG tablet Take 3 tablets daily for 2 days, 2 tablets daily for 3 days, 1 tablets daily for 3 days,  15 tablet  0   No current facility-administered medications for this visit.    History   Social History  . Marital Status: Legally Separated    Spouse Name: N/A    Number of Children:  N/A  . Years of Education: N/A   Occupational History  . Not on file.   Social History Main Topics  . Smoking status: Current Every Day Smoker -- 0.50 packs/day for 30 years    Types: Cigarettes    Last Attempt to Quit: 04/13/2012  . Smokeless tobacco: Former Neurosurgeon    Quit date: 04/13/2012     Comment: have electronic cigarette  . Alcohol Use: Yes     Comment: Denies current heavy EtOH, says a few drinks per week. Endorses prior heavy use.   . Drug Use: No     Comment: Patient daughter in room and patient distressed. Will discuss drug use at later evaluation.  . Sexually Active: No   Other Topics Concern  . Not on file   Social History Narrative   Works at [employer].     family history is not on file.  Physical Exam Blood pressure 133/93, pulse 106,  temperature 97.4 F (36.3 C), temperature source Oral, weight 153 lb 9.6 oz (69.673 kg), SpO2 98.00%. General:  No acute distress, alert and oriented x 3 HEENT:  PERRL, EOMI, moist mucous membranes, Oropharynx clear, no exudate or lesions.  Cardiovascular:  Regular rate and rhythm, no murmurs, rubs or gallops Respiratory:  Clear to auscultation bilaterally, no wheezes, rales, or rhonchi, good effort, Abdomen:  Soft, nondistended, nontender, positive bowel sounds Extremities:  Warm and well-perfused, no clubbing, cyanosis, or edema.  Skin: Warm, dry, no rashes Neuro: Not anxious appearing, no depressed mood, normal affect  Labs: Lab Results  Component Value Date   CREATININE 0.48* 04/27/2012   BUN 9 04/27/2012   NA 137 04/27/2012   K 3.7 04/27/2012   CL 95* 04/27/2012   CO2 31 04/27/2012   Lab Results  Component Value Date   WBC 10.4 04/27/2012   HGB 10.9* 04/27/2012   HCT 32.7* 04/27/2012   MCV 90.8 04/27/2012   PLT 282 04/27/2012      Assessment and Plan:   FOLLOWUP: Patricia Farrell will follow back up in our clinic in approximately 2-3 weeks. Patricia Farrell knows to call our clinic in the meantime with any questions or new issues.

## 2012-05-17 NOTE — Assessment & Plan Note (Signed)
Resolved. Pt using O2 just PRN, did not bring it to clinic today> O2 sats are WNL.

## 2012-05-17 NOTE — Assessment & Plan Note (Addendum)
Patient brings her medications to this visit and states that she has been taking buspirone as well as Celexa. We discussed the risk of serotonin syndrome taking these 2 medications together. She is agreeable to stop taking the buspirone and just continuing on the Celexa. On her medication bottle, there are written instructions to take an extra one or 2 doses of Celexa if patient is feeling particularly anxious. We discussed that this is not the proper way to administer this medication and patient is agreeable to continuing to just take 40 mg daily.  Consider starting patient on Wellbutrin as this may help her with smoking cessation as well as depression, however, patient has tried this medication before and states that she gets "sick" on this medication and she refuses to try it again.  -Continue Celexa 40 mg daily  Taper buspirone, start wellbutrin, cont celexa

## 2012-05-17 NOTE — Assessment & Plan Note (Signed)
Patient states that her odynophagia has resolved. She does not have any more white lesions on her tongue. She is able swallow and eat without difficulty.  -Continue Diflucan for one more week then discontinue

## 2012-05-17 NOTE — Assessment & Plan Note (Signed)
Patient with COPD without known documented PFTs available. Patient would benefit from daily suppressive therapy, however, she is unable to afford inhalers at this time.  -Referred for PFTs -Continue when necessary nebulizers as patient already has these at home -She can get inhalers from the health department once she gets the orange card discount program approved

## 2012-05-17 NOTE — Assessment & Plan Note (Signed)
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 She states that she has not offered any protective equipment while she does this job. She does do it outside during the warmer months of the year. We discussed the use of a protective mask with a carbon filter, however, without an official investigation of the occupational exposures at her work, it is difficult to advise on adequate protective equipment.  -Recommended talking to her supervisors about proper equipment at work -Will write a letter to her employer asking to make an assessment of this position and assess the need for protective devices -Okay to wear carbon filter mask for now, knowing that this may not be adequate

## 2012-05-17 NOTE — Patient Instructions (Signed)
Please return to clinic in 1-2 months  Please pick up medications from your pharmacy  Please return to clinic to see Jaynee Eagles to apply for the [facility] as soon as possible

## 2012-05-25 ENCOUNTER — Ambulatory Visit: Payer: Self-pay

## 2012-06-08 ENCOUNTER — Ambulatory Visit (INDEPENDENT_AMBULATORY_CARE_PROVIDER_SITE_OTHER): Payer: Self-pay | Admitting: Internal Medicine

## 2012-06-08 ENCOUNTER — Ambulatory Visit (HOSPITAL_COMMUNITY)
Admission: RE | Admit: 2012-06-08 | Discharge: 2012-06-08 | Disposition: A | Discharge status: Home / Self Care | Payer: Self-pay | Source: Ambulatory Visit | Attending: Internal Medicine | Admitting: Internal Medicine

## 2012-06-08 ENCOUNTER — Encounter: Payer: Self-pay | Admitting: Internal Medicine

## 2012-06-08 VITALS — BP 126/85 | HR 81 | Temp 97.0°F | Ht 63.0 in | Wt 156.3 lb

## 2012-06-08 DIAGNOSIS — F17200 Nicotine dependence, unspecified, uncomplicated: Secondary | ICD-10-CM

## 2012-06-08 DIAGNOSIS — J449 Chronic obstructive pulmonary disease, unspecified: Secondary | ICD-10-CM | POA: Insufficient documentation

## 2012-06-08 DIAGNOSIS — R109 Unspecified abdominal pain: Secondary | ICD-10-CM

## 2012-06-08 DIAGNOSIS — J4489 Other specified chronic obstructive pulmonary disease: Secondary | ICD-10-CM | POA: Insufficient documentation

## 2012-06-08 DIAGNOSIS — Z569 Unspecified problems related to employment: Secondary | ICD-10-CM

## 2012-06-08 DIAGNOSIS — Z720 Tobacco use: Secondary | ICD-10-CM

## 2012-06-08 DIAGNOSIS — Z579 Occupational exposure to unspecified risk factor: Secondary | ICD-10-CM

## 2012-06-08 MED ORDER — ALBUTEROL SULFATE (5 MG/ML) 0.5% IN NEBU
2.5000 mg | INHALATION_SOLUTION | Freq: Once | RESPIRATORY_TRACT | Status: AC
  Administered 2012-06-08: 2.5 mg via RESPIRATORY_TRACT

## 2012-06-08 NOTE — Assessment & Plan Note (Addendum)
Patient complains of discoloration of her skin and pain over her flank. There is a mild discoloration of the skin on exam but no clear ecchymoses. No overlying skin erythema or deformity, normal abdominal exam, no organomegaly or tenderness, flank is minimally tender to palpation. As patient states this is improving and she has had this since her ICU stay, we will monitor her for now. Her blood pressure and heart rate are normal, no other signs or symptoms of blood loss. No abdominal pain, nausea, vomiting to suggest hemorrhagic pancreatitis, no trauma or signs of retroperitoneal hematoma. Patient is not having any hematuria or dysuria, and no suprapubic pain on exam.  -monitor for now -reviewed s/s blood loss -return to clinic if not improved in 2 weeks for further evaluation

## 2012-06-08 NOTE — Assessment & Plan Note (Addendum)
Patient is now wearing mask at work and this has improved her shortness of breath. She states that [employer] is going to be coming to her workplace to do an assessment of her position to evaluate workplace exposures and the need for protective equipment.

## 2012-06-08 NOTE — Assessment & Plan Note (Signed)
Pt getting PFTs today

## 2012-06-08 NOTE — Progress Notes (Signed)
Internal Medicine Clinic Visit    HPI:  Patricia Farrell is a 47 y.o. year old female who presents for followup. She was admitted on 04/17/2012 with ARDS and sepsis secondary to influenza/strep pneumo pneumonia. She was intubated during her hospitalization and had a prolonged recovery course.   Patient states she has been doing much better since her last visit. She rarely has shortness of breath and she has not used her oxygen at home or her inhalers and over 3 weeks. She does have a mild cough but she thinks this is from the smoking. She denies fever, chills, chest pain. She occasionally gets fatigued but this is also improving.  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 She states this has helped tremendously with her shortness of breath.  She has also tapered off the buspirone and is scheduled to get PFTs today.  She is still smoking about a half-pack to a pack per day. She uses electronic cigarettes as well.  Patient also complains of some right-sided flank pain. She has noticed that since her stay in the ICU, she has had some discoloration of the skin around her right flank. She states it is getting better but the skin and subcutaneous tissue is still tender to touch. She does not have any dysuria or hematuria. No abdominal pain, nausea, vomiting, dizziness, melena, BRBPR.    Past Medical History  Diagnosis Date  . Depression   . Headache   . Acute bronchopneumonia 04/17/2012  . COPD (chronic obstructive pulmonary disease) 04/19/2012  . Candida infection, esophageal 04/30/2012    Possible candidal esophagitis     Past Surgical History  Procedure Laterality Date  . Tubal ligation    . Abdominal hysterectomy       ROS:  A complete review of systems was otherwise negative, except as noted in the HPI.  Allergies: Review of patient's allergies indicates no known allergies.  Medications: Current Outpatient Prescriptions  Medication  Sig Dispense Refill  . acetaminophen (TYLENOL) 500 MG tablet Take 1 tablet (500 mg total) by mouth every 6 (six) hours as needed. For pain.  30 tablet  0  . albuterol (PROVENTIL) (2.5 MG/3ML) 0.083% nebulizer solution Take 3 mLs (2.5 mg total) by nebulization every 6 (six) hours as needed for wheezing.  75 mL  12  . aspirin EC 81 MG tablet Take 81 mg by mouth daily.        . calcium carbonate (OS-CAL - DOSED IN MG OF ELEMENTAL CALCIUM) 1250 MG tablet Take 1 tablet by mouth daily.        . citalopram (CELEXA) 40 MG tablet Take 1 tablet (40 mg total) by mouth daily.  30 tablet  6  . fish oil-omega-3 fatty acids 1000 MG capsule Take 2 g by mouth daily.        Marland Kitchen ipratropium (ATROVENT) 0.02 % nebulizer solution Take 2.5 mLs (500 mcg total) by nebulization every 6 (six) hours as needed for wheezing.  75 mL  12  . Multiple Vitamins-Minerals (MULTIVITAMINS THER. W/MINERALS) TABS Take 1 tablet by mouth daily.         No current facility-administered medications for this visit.    History   Social History  . Marital Status: Legally Separated    Spouse Name: N/A    Number of Children: N/A  . Years of Education: N/A   Occupational History  . Not on file.   Social History Main Topics  .  Smoking status: Current Every Day Smoker -- 0.50 packs/day for 30 years    Types: Cigarettes    Last Attempt to Quit: 04/13/2012  . Smokeless tobacco: Former Neurosurgeon    Quit date: 04/13/2012     Comment: have electronic cigarette  . Alcohol Use: Yes     Comment: Denies current heavy EtOH, says a few drinks per week. Endorses prior heavy use.   . Drug Use: No     Comment: Patient daughter in room and patient distressed. Will discuss drug use at later evaluation.  . Sexually Active: No   Other Topics Concern  . Not on file   Social History Narrative   Works at [employer].     family history is not on file.  Physical Exam Blood pressure 126/85, pulse 81, temperature 97 F (36.1 C), temperature source  Oral, height 5\' 3"  (1.6 m), weight 156 lb 4.8 oz (70.897 kg), SpO2 95.00%. General:  No acute distress, alert and oriented x 3 HEENT:  PERRL, EOMI, moist mucous membranes, Oropharynx clear, no exudate or lesions.  Cardiovascular:  Regular rate and rhythm, no murmurs, rubs or gallops Respiratory:  Clear to auscultation bilaterally, no wheezes, rales, or rhonchi, good effort,decreased breath sounds at bases Abdomen:  Soft, nondistended, nontender, positive bowel sounds Back: Perhaps a slight hyperpigmentation around her right flank and hip, no clear borders, not consistent with ecchymoses, mildly tender to palpation. Minimal tenderness with percussion. Extremities:  Warm and well-perfused, no clubbing, cyanosis, or edema.  Skin: Warm, dry, no rashes Neuro: Not anxious appearing, no depressed mood, normal affect  Labs: Lab Results  Component Value Date   CREATININE 0.48* 04/27/2012   BUN 9 04/27/2012   NA 137 04/27/2012   K 3.7 04/27/2012   CL 95* 04/27/2012   CO2 31 04/27/2012   Lab Results  Component Value Date   WBC 10.4 04/27/2012   HGB 10.9* 04/27/2012   HCT 32.7* 04/27/2012   MCV 90.8 04/27/2012   PLT 282 04/27/2012      Assessment and Plan:  Case discussed with Dr. Criselda Peaches  FOLLOWUP: Charma Igo will follow back up in our clinic in approximately 2-3 weeks. ALIX LAHMANN knows to call our clinic in the meantime with any questions or new issues.

## 2012-06-08 NOTE — Assessment & Plan Note (Addendum)
Patient continues to smoke a half to one pack per day. She does use electronic cigarettes at work. We did discuss the importance and health benefits of quitting and I referred her to 1 800 QUIT NOW. She was not interested in nicotine replacement therapy at this time. She will consider in the future.  -follow up at next visit, continue to encourage smoking cessation

## 2012-06-08 NOTE — Patient Instructions (Addendum)
Please return to clinic in 4-6 months, sooner if needed  RESOURCE GUIDE  Chronic Pain Problems: Contact [facility]  [phone] Patients need to be referred by their primary care doctor.  Insufficient Money for Medicine: Contact United Way:  call "211."   No Primary Care Doctor: - Call Health Connect  [phone] - can help you locate a primary care doctor that  accepts your insurance, provides certain services, etc. - Physician Referral Service- [phone]  Agencies that provide inexpensive medical care: - [facility]  [phone] - [facility]  [phone] - [facility]  [phone] - Women's Clinic  [phone] - Planned Parenthood  [phone] [facility]  [phone]  Medicaid-accepting [facility] Providers: - [facility]- [address]  [phone], Mon-Fri 9am-7pm, Sat 9am-1pm - [facility]- [address] [place], Suite [place]  [phone] - [employer]- [address], Suite [place]  [phone] [facility]- [address]  [phone] - Renaye Rakers- [address] [place], [address], [phone]  Only accepts [place] patients after they have their name  applied to their card  Self Pay (no insurance) in [place]: - Sickle Cell Patients: Dr Willey Blade, [facility]  [address] [place], [phone] - [facility]- [address] [place]  [phone]       [facility]- [address]       -     [facility]- see information above (Speak to [employer] if you do not have insurance)       -  [facility]- [address],  [phone]       -  [facility]- [address], [phone]       -  Dr Julio Sicks-  [address], [address], [place], [phone]       -  [facility] - [address], [phone]  -  [facility]- [address], [phone], also [address], [phone]       -    [facility]- [address] [place], [phone], 1st & 3rd Saturday        every month, 10am-1pm  [facility] [address] [place], [place] [address] [phone]  [facility] [address] [place], [place] [address] [phone]  1) Find a Doctor and Pay Out of Pocket Although you won't have to find out who is covered by your insurance plan, it is a good idea to ask around and get recommendations. You will then need to call the office and see if the doctor you have chosen will accept you as a new patient and what types of options they offer for patients who are self-pay. Some doctors offer discounts or will set up payment plans for their patients who do not have insurance, but you will need to ask so you aren't surprised when you get to your appointment.  2) Contact Your Local Health Department Not all health departments have doctors that can see patients for sick visits, but many do, so it is worth a call to see if yours does. If you don't know where your local health department is, you can check in your phone book. The  CDC also has a tool to help you locate your state's health department, and many state websites also have listings of all of their local health departments.  3) Find a Walk-in Clinic If your illness is not likely to be very severe or complicated, you may want to try a walk in clinic. These are popping up all over the country in pharmacies, drugstores, and shopping centers. They're usually staffed by nurse practitioners or physician assistants that have been trained to treat common illnesses and complaints. They're usually fairly quick and inexpensive. However, if you have serious medical issues or chronic medical problems, these are probably not your best option  STD Testing - [facility] Department of [facility]  [place], STD Clinic, [address] [place], phone [phone] or [phone].  Monday - Friday, call for an appointment. [facility] Department of [employer], STD Clinic, [place] [address], [place], phone [phone] or [phone].  Monday - Friday, call for an appointment.  Abuse/Neglect: [facility] Child Abuse Hotline 475-378-6106 [facility] Child Abuse Hotline 7622658816 (After Hours)  Emergency Shelter:  [facility] [phone]  Maternity Homes: - Room at the [place] [facility] [phone] - [facility] [phone]  MRSA Hotline #:   8640515707  Dental Assistance If unable to pay or uninsured, contact:  [facility]. to become qualified for the adult dental clinic.  Patients with Medicaid: [facility] [phone] [address], [phone] [address] [phone]  If unable to pay, or uninsured, contact [facility] [phone] in [place], [phone] in [facility]) to become qualified for the adult dental clinic  [facility] [address] [place], [place] [address] [phone] [email or web address]  Other Proofreader Services: - [facility]- [address] [place], [place], [place], [address], [phone], Ext. [phone], 2nd and 4th Thursday of the month at 6:30am.  10 clients each day by appointment, can sometimes see walk-in patients if someone does not show for an appointment. [facility]- [address] Ether Griffins [place], [place], [address], [phone] - [facility] [address], [place], [place], [address], [phone] - [facility] [facility]- [phone] [facility] Health Department- [phone] [facility] Health [phone]       Behavioral Health Resources in the [facility]  Intensive Outpatient Programs: [facility]      [address] [place], [place] [phone] Both a day and evening program       [facility] Outpatient     [address]        [place], [place] [address] [phone]         ADS: [facility] [address] [place] [phone]  [facility] Mental Health ACCESS LINE: (980)783-3749 or [phone] [address] [place], [place] [address] [email or web address]   Substance Abuse Resources: - [facility] Drug [facility]  [phone] - [facility] [phone] - [facility] [place] [phone] Floydene Flock [phone] - Residential & Outpatient Substance Abuse Program  [phone]  Psychological Services: [facility]  [phone] [facility] Services  [phone] - [facility], [phone] [place]. [address], [place], ACCESS LINE: (214) [phone] or (631)[phone], [email or web address]  Mobile Crisis Teams:  Therapeutic Alternatives         Mobile Crisis Care Unit [phone]             [facility] [address] [place]in[place] Otto [phone]                                         Interventionist [address] [place] [place] [phone]  Self-Help/Support Groups: Mental Health Assoc. of [employer] of support groups [phone] (call for more info)   Narcotics Anonymous (NA) Caring Services [address] [place] - 2 meetings at this location  Residential Treatment Programs:  [facility]      [address]        [place]       [phone]         [facility] [address] [place] [address]272 [place], [place]  [address] [phone]  [facility]  [address] [place], [place] [address] [phone] Admissions: 8am-3pm M-F  [facility]     [address]        [place], [place] [address]       [phone]         [facility] [address] [place]ling [place]s [place], [place] [phone]  [facility] [address] [place], [place] [phone]  [facility] - Intensive Outpatient      [address]     [place], [place]       [phone]         [facility] ([facility]iction Recovery [facility]     [address], [place] [phone] or [phone]  Residential Treatment Services (RTS), Medicaid [address] [place], [place] [phone]  Fellowship [address]                                               [address] [place] [phone]  [facility] Resources: [phone]               General Therapy                                                Angie Fava, PhD        [address] [place], [place] [address]         [phone]   Insurance  [facility] Behavioral   [address] [place], [place] [address] [phone]  [facility] [address] [place], [place] [address] [phone] Insurance/Medicaid/sponsorship through [facility]  Faith and Families                                              [phone]  [place], [place] [address]    Therapy/tele-psych/case         [phone]          [facility] [address], [place]  [address]  Adolescent/group home/case management [phone]                                           Creola Corn PhD       General therapy       Insurance   [phone]         Dr. Lolly Mustache, [place], M-F [phone]  [facility] [place]  [employer] [facility] Dept. [address]                 [address]         [address] [place] [address]  Blondell Reveal Phone:  [phone]                                   Phone:  [phone]                   Phone:  [phone]  [facility], [phone] - [facility] [place] - [facility]- [phone]       -     [facility] in [place], [address]             [phone], Insurance  [place] Child Abuse Hotline (843)423-2424 or 6613175400 (After Hours)

## 2012-06-21 ENCOUNTER — Telehealth: Payer: Self-pay | Admitting: *Deleted

## 2012-06-21 NOTE — Telephone Encounter (Signed)
Pt called and is requesting that we call [facility] to let them know she no longer needs oxygen equipment in her home. There is a charge for this so she wants it out ASAP Pt # [phone]

## 2012-06-22 ENCOUNTER — Other Ambulatory Visit: Payer: Self-pay | Admitting: Internal Medicine

## 2012-06-22 DIAGNOSIS — Z1231 Encounter for screening mammogram for malignant neoplasm of breast: Secondary | ICD-10-CM

## 2012-06-28 ENCOUNTER — Ambulatory Visit (HOSPITAL_COMMUNITY)
Admission: RE | Admit: 2012-06-28 | Discharge: 2012-06-28 | Disposition: A | Discharge status: Home / Self Care | Payer: Self-pay | Source: Ambulatory Visit | Attending: Internal Medicine | Admitting: Internal Medicine

## 2012-06-28 DIAGNOSIS — Z1231 Encounter for screening mammogram for malignant neoplasm of breast: Secondary | ICD-10-CM

## 2012-06-29 NOTE — Telephone Encounter (Signed)
Order to d/c home oxygen was faxed to [facility].  Written by Dr Josem Kaufmann.

## 2012-06-29 NOTE — Telephone Encounter (Signed)
On February 10th Patricia Farrell saw Dr. Collier Bullock and her resting RA O2 sat was 97% and ambulatory RA O2 sat was 98%.  At that time, she no longer qualified for home O2.  Please contact [facility] to remove the oxygen equipment from her home as she no longer qualifies for home O2.  Thank You.

## 2012-07-05 ENCOUNTER — Other Ambulatory Visit: Payer: Self-pay | Admitting: Internal Medicine

## 2012-07-05 DIAGNOSIS — R928 Other abnormal and inconclusive findings on diagnostic imaging of breast: Secondary | ICD-10-CM

## 2012-07-19 ENCOUNTER — Other Ambulatory Visit: Payer: Self-pay | Admitting: *Deleted

## 2012-07-19 DIAGNOSIS — N6489 Other specified disorders of breast: Secondary | ICD-10-CM

## 2012-07-20 ENCOUNTER — Ambulatory Visit (HOSPITAL_COMMUNITY)
Admission: RE | Admit: 2012-07-20 | Discharge: 2012-07-20 | Disposition: A | Discharge status: Home / Self Care | Payer: Self-pay | Source: Ambulatory Visit | Attending: Obstetrics and Gynecology | Admitting: Obstetrics and Gynecology

## 2012-07-20 ENCOUNTER — Encounter (HOSPITAL_COMMUNITY): Payer: Self-pay

## 2012-07-20 VITALS — BP 118/74 | Temp 98.2°F | Ht 64.0 in | Wt 156.4 lb

## 2012-07-20 DIAGNOSIS — Z1239 Encounter for other screening for malignant neoplasm of breast: Secondary | ICD-10-CM

## 2012-07-20 HISTORY — DX: Endometriosis, unspecified: N80.9

## 2012-07-20 NOTE — Progress Notes (Signed)
Patient referred to [facility] by the [facility] due to needing additional imaging of the left breast. Screening mamm[facility]. Patient complained of lower left breast tenderness that she rated at a 3 out of 10.  Pap Smear:    Pap smear not completed today. Patient has a history of a hysterectomy in 1991 for benign reasons. Per patient has no history of an abnormal Pap smear. Patient does not need any further Pap smears per BCCCP and ACOG guidelines. No Pap smear results in EPIC.  Physical exam: Breasts Breasts symmetrical. No skin abnormalities bilateral breasts. No nipple retraction bilateral breasts. No nipple discharge bilateral breasts. No lymphadenopathy. No lumps palpated bilateral breasts. Complaints of some lower left breast tenderness on exam. Referred patient to the [facility] for left breast diagnostic mammogram and ultrasound per recommendation. Appointment scheduled for Friday, July 30, 2012 at 1330.  Pelvic/Bimanual No Pap smear completed today since patient has a history of a hysterectomy for benign reasons. Pap smear not indicated per BCCCP guidelines.

## 2012-07-20 NOTE — Patient Instructions (Signed)
Taught patient how to perform BSE. Patient did not need a Pap smear today due to a history of a hysterectomy for benign reasons. Let patient know that she will not need any further Pap smears due to history of a hysterectomy for benign reasons. [facility] for left breast diagnostic mammogram and ultrasound per recommendation. Appointment scheduled for Friday, July 30, 2012 at 1330. Patient aware of appointment and will be there. Smoking Cessation discussed and information given to patient. Patient verbalized understanding.

## 2012-07-30 ENCOUNTER — Ambulatory Visit
Admission: RE | Admit: 2012-07-30 | Discharge: 2012-07-30 | Disposition: A | Discharge status: Home / Self Care | Payer: No Typology Code available for payment source | Source: Ambulatory Visit | Attending: Internal Medicine | Admitting: Internal Medicine

## 2012-07-30 DIAGNOSIS — R928 Other abnormal and inconclusive findings on diagnostic imaging of breast: Secondary | ICD-10-CM

## 2012-09-20 ENCOUNTER — Emergency Department (INDEPENDENT_AMBULATORY_CARE_PROVIDER_SITE_OTHER): Payer: Self-pay

## 2012-09-20 ENCOUNTER — Encounter (HOSPITAL_COMMUNITY): Payer: Self-pay | Admitting: Emergency Medicine

## 2012-09-20 ENCOUNTER — Emergency Department (INDEPENDENT_AMBULATORY_CARE_PROVIDER_SITE_OTHER)
Admission: EM | Admit: 2012-09-20 | Discharge: 2012-09-20 | Disposition: A | Discharge status: Home / Self Care | Payer: Self-pay | Source: Home / Self Care | Attending: Emergency Medicine | Admitting: Emergency Medicine

## 2012-09-20 DIAGNOSIS — S2341XA Sprain of ribs, initial encounter: Secondary | ICD-10-CM

## 2012-09-20 DIAGNOSIS — S2329XA Dislocation of other parts of thorax, initial encounter: Secondary | ICD-10-CM

## 2012-09-20 MED ORDER — HYDROCODONE-ACETAMINOPHEN 5-325 MG PO TABS
2.0000 | ORAL_TABLET | Freq: Once | ORAL | Status: AC
  Administered 2012-09-20: 2 via ORAL

## 2012-09-20 MED ORDER — IBUPROFEN 800 MG PO TABS
ORAL_TABLET | ORAL | Status: AC
  Filled 2012-09-20: qty 1

## 2012-09-20 MED ORDER — HYDROCODONE-ACETAMINOPHEN 5-325 MG PO TABS: 1.0000 | ORAL_TABLET | Freq: Once | ORAL | Status: DC

## 2012-09-20 MED ORDER — IBUPROFEN 800 MG PO TABS: 800.0000 mg | ORAL_TABLET | Freq: Once | ORAL | Status: DC

## 2012-09-20 MED ORDER — HYDROCODONE-ACETAMINOPHEN 5-325 MG PO TABS
ORAL_TABLET | ORAL | Status: AC
  Filled 2012-09-20: qty 2

## 2012-09-20 MED ORDER — OXYCODONE-ACETAMINOPHEN 5-325 MG PO TABS: ORAL_TABLET | ORAL | Status: DC

## 2012-09-20 MED ORDER — MELOXICAM 15 MG PO TABS: 15.0000 mg | ORAL_TABLET | Freq: Every day | ORAL | Status: DC

## 2012-09-20 NOTE — ED Notes (Signed)
Pt waiting for her ride
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
## 2012-09-20 NOTE — ED Notes (Signed)
Patient requested stronger medicine.  Spoke to dr Lorenz Coaster.  Patient has a ride.

## 2012-09-20 NOTE — ED Notes (Signed)
Patient not in treatment room-gone to xray

## 2012-10-13 ENCOUNTER — Telehealth: Payer: Self-pay | Admitting: *Deleted

## 2012-10-13 NOTE — Telephone Encounter (Signed)
No longer using nebulizer - returned to company. Pt wants to try inhaler for COPD. 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 Pt talked with Rudell Cobb. Appt made 7/11/10AM Dr Virgina Organ. Stanton Kidney Lyrique Hakim RN 10/13/12 12N

## 2012-10-15 ENCOUNTER — Ambulatory Visit: Payer: Self-pay | Admitting: Internal Medicine

## 2012-10-15 ENCOUNTER — Encounter: Payer: Self-pay | Admitting: Internal Medicine

## 2012-12-08 ENCOUNTER — Encounter: Payer: Self-pay | Admitting: Internal Medicine

## 2012-12-28 ENCOUNTER — Ambulatory Visit (INDEPENDENT_AMBULATORY_CARE_PROVIDER_SITE_OTHER): Payer: Self-pay | Admitting: *Deleted

## 2012-12-28 DIAGNOSIS — Z23 Encounter for immunization: Secondary | ICD-10-CM

## 2013-01-06 ENCOUNTER — Ambulatory Visit: Payer: Self-pay | Admitting: Internal Medicine

## 2013-01-12 ENCOUNTER — Ambulatory Visit (INDEPENDENT_AMBULATORY_CARE_PROVIDER_SITE_OTHER): Payer: Self-pay | Admitting: Internal Medicine

## 2013-01-12 ENCOUNTER — Encounter: Payer: Self-pay | Admitting: Internal Medicine

## 2013-01-12 VITALS — BP 125/84 | HR 82 | Temp 97.6°F | Ht 65.25 in | Wt 158.9 lb

## 2013-01-12 DIAGNOSIS — M48061 Spinal stenosis, lumbar region without neurogenic claudication: Secondary | ICD-10-CM | POA: Insufficient documentation

## 2013-01-12 DIAGNOSIS — Z720 Tobacco use: Secondary | ICD-10-CM

## 2013-01-12 DIAGNOSIS — F17200 Nicotine dependence, unspecified, uncomplicated: Secondary | ICD-10-CM

## 2013-01-12 DIAGNOSIS — J449 Chronic obstructive pulmonary disease, unspecified: Secondary | ICD-10-CM

## 2013-01-12 DIAGNOSIS — F329 Major depressive disorder, single episode, unspecified: Secondary | ICD-10-CM

## 2013-01-12 DIAGNOSIS — D649 Anemia, unspecified: Secondary | ICD-10-CM

## 2013-01-12 HISTORY — DX: Anemia, unspecified: D64.9

## 2013-01-12 MED ORDER — PNEUMOCOCCAL VAC POLYVALENT 25 MCG/0.5ML IJ INJ: 0.5000 mL | INJECTION | Freq: Once | INTRAMUSCULAR | Status: DC

## 2013-01-12 MED ORDER — CITALOPRAM HYDROBROMIDE 40 MG PO TABS: 40.0000 mg | ORAL_TABLET | Freq: Every day | ORAL | Status: DC

## 2013-01-12 MED ORDER — ALBUTEROL SULFATE HFA 108 (90 BASE) MCG/ACT IN AERS: 2.0000 | INHALATION_SPRAY | Freq: Four times a day (QID) | RESPIRATORY_TRACT | Status: DC | PRN

## 2013-01-12 NOTE — Progress Notes (Signed)
HPI The patient is a 47 y.o. female with a history of COPD, tobacco abuse, presenting for an acute visit.  The patient notes a several month right leg numbness, described as "no feeling" on the lateral right thigh.  Symptoms are worse after standing for a long period of time, often relieved by sitting (though only sometimes).  The patient also notes some right lower back pain during the same period.  No urinary or stool incontinence, no saddle anesthesia.  The patient requests a refill of celexa, which she has been on for 4 years.  She notes that her mood is "ok", though she feels that it is sometimes "not enough".  The patient reports being told she had COPD.  She asks for an inhaler.  The patient continues to smoke.  The patient has decreased her cigarette usage to 1/2 ppd.  She is currently trying ecigs, with which she is happy.  The patient has tried the nicotine gum in the past, which helped her quit for 3 months.  ROS: General: no fevers, chills, changes in weight, changes in appetite Skin: no rash HEENT: no blurry vision, hearing changes, sore throat Pulm: see HPI CV: no chest pain, palpitations Abd: no abdominal pain, nausea/vomiting, diarrhea/constipation GU: no dysuria, hematuria, polyuria Ext: no arthralgias, myalgias Neuro: see HPI  Filed Vitals:   01/12/13 0914  BP: 125/84  Pulse: 82  Temp: 97.6 F (36.4 C)    PEX General: alert, cooperative, and in no apparent distress HEENT: pupils equal round and reactive to light, vision grossly intact, oropharynx clear and non-erythematous  Neck: supple, no lymphadenopathy, Lungs: clear to ascultation bilaterally, normal work of respiration, no wheezes, rales, ronchi Heart: regular rate and rhythm, no murmurs, gallops, or rubs Abdomen: soft, non-tender, non-distended, normal bowel sounds Back: Mild lumbar vertebral and paraspinal tenderness to palpation Extremities: no cyanosis, clubbing, or edema Neurologic: alert & oriented  X3, cranial nerves II-XII intact, strength grossly intact, sensation intact to light touch  Current Outpatient Prescriptions on File Prior to Visit  Medication Sig Dispense Refill  . acetaminophen (TYLENOL) 500 MG tablet Take 1 tablet (500 mg total) by mouth every 6 (six) hours as needed. For pain.  30 tablet  0  . albuterol (PROVENTIL) (2.5 MG/3ML) 0.083% nebulizer solution Take 3 mLs (2.5 mg total) by nebulization every 6 (six) hours as needed for wheezing.  75 mL  12  . aspirin EC 81 MG tablet Take 81 mg by mouth daily.        . calcium carbonate (OS-CAL - DOSED IN MG OF ELEMENTAL CALCIUM) 1250 MG tablet Take 1 tablet by mouth daily.        . citalopram (CELEXA) 40 MG tablet Take 1 tablet (40 mg total) by mouth daily.  30 tablet  6  . fish oil-omega-3 fatty acids 1000 MG capsule Take 2 g by mouth daily.        Marland Kitchen ibuprofen (ADVIL,MOTRIN) 200 MG tablet Take 200 mg by mouth every 6 (six) hours as needed for pain.      Marland Kitchen ipratropium (ATROVENT) 0.02 % nebulizer solution Take 2.5 mLs (500 mcg total) by nebulization every 6 (six) hours as needed for wheezing.  75 mL  12  . meloxicam (MOBIC) 15 MG tablet Take 1 tablet (15 mg total) by mouth daily.  15 tablet  0  . Multiple Vitamins-Minerals (MULTIVITAMINS THER. W/MINERALS) TABS Take 1 tablet by mouth daily.        Marland Kitchen oxyCODONE-acetaminophen (PERCOCET) 5-325 MG per tablet  1 to 2 tablets every 6 hours as needed for pain.  20 tablet  0   No current facility-administered medications on file prior to visit.    Assessment/Plan

## 2013-01-12 NOTE — Assessment & Plan Note (Signed)
The patient's symptoms of right leg numbness and low back pain are consistent with spinal stenosis. We discussed several conservative measures for improvement of this numbness. -Avoid positions that worsen the numbness -Ice lower back when necessary -May use ibuprofen when necessary

## 2013-01-12 NOTE — Assessment & Plan Note (Addendum)
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 The patient is currently not using any inhaler, and notes only mild symptoms. -Provided albuterol inhaler for occasional symptomatic relief -Patient informed that if she is using albuterol frequently, that we may need to start a daily inhaler -Patient counseled on smoking cessation -Patient given Pneumovax today.

## 2013-01-12 NOTE — Assessment & Plan Note (Signed)
  Assessment: Progress toward smoking cessation:  smoking less Barriers to progress toward smoking cessation:  stress Comments: The patient notes that she has tried to cut back on smoking, using electronic cigarettes, with some success.  Plan: Instruction/counseling given:  I counseled patient on the dangers of tobacco use, advised patient to stop smoking, and reviewed strategies to maximize success. Educational resources provided:  QuitlineNC Designer, jewellery) brochure Self management tools provided:  smoking cessation plan (STAR Quit Plan) Medications to assist with smoking cessation:  electronic cigarettes Patient agreed to the following self-care plans for smoking cessation:    Other plans: If electronic cigarettes are unsuccessful, can try nicotine patch, or Chantix, among others.

## 2013-01-12 NOTE — Addendum Note (Signed)
Addended by: Youlanda Roys A on: 01/12/2013 11:00 AM   Modules accepted: Orders

## 2013-01-12 NOTE — Patient Instructions (Addendum)
General Instructions: We have refilled your celexa today.  Congratulations on cutting back on smoking!!  Quitting smoking is the best thing you can do for your health.  Continue to try the electronic cigarettes.  If these are not successful, we can discuss other treatment options.  I reviewed your lung tests, which did show mild COPD.  You may use an inhaler as needed for shortness of breath.  If you find that you are using your inhaler more than 2 times per week, let us know, and we will likely need to start you on a daily inhaler.  Your leg numbness is due to a condition called Spinal Stenosis (see information below).  To treat this: 1. Try to avoid positions that worsen your symptoms (ie try using a chair at work if possible) 2. Ice your lower back as needed, for 10 minutes at a time, followed by a 10 minute break 3. You may take ibuprofen occasionally as needed for pain  Please return for a follow-up visit in 6-7 months for a visit with your Primary Care Provider.   Treatment Goals:  Goals (1 Years of Data) as of 01/12/13   None      Progress Toward Treatment Goals:  Treatment Goal 01/12/2013  Stop smoking smoking less    Self Care Goals & Plans:  Self Care Goal 01/12/2013  Manage my medications take my medicines as prescribed; bring my medications to every visit; refill my medications on time; follow the sick day instructions if I am sick  Eat healthy foods eat more vegetables; eat fruit for snacks and desserts; eat baked foods instead of fried foods  Be physically active take a walk every day; find an activity I enjoy  Stop smoking -    No flowsheet data found.   Care Management & Community Referrals:  Referral 01/12/2013  Referrals made for care management support none needed      Spinal Stenosis One cause of back pain is spinal stenosis. Stenosis means abnormal narrowing. The spinal canal contains and protects the spinal nerve roots. In spinal stenosis, the spinal  canal narrows and pinches the spinal cord and nerves. This causes low back pain and pain in the legs. Stenosis may pinch the nerves that control muscles and sensation in the legs. This leads to pain and abnormal feelings in the leg muscles and areas supplied by those nerves. CAUSES  Spinal stenosis often happens to people as they get older and arthritic boney growths occur in their spinal canal. There is also a loss of the disk height between the bones of the back, which also adds to this problem. Sometimes the problem is present at birth. SYMPTOMS   Pain that is generally worse with activities, particularly standing and walking.  Numbness, tingling, hot or cold feelings, weakness, or a weariness in the legs.  Clumsiness, frequent falling, and a foot-slapping gait, which may come as a result of nerve pressure and muscle weakness. DIAGNOSIS   Your caregiver may suspect spinal stenosis if you have unusual leg symptoms, such as those previously mentioned.  Your orthopedic surgeon may request special imaging exams, such a computerized magnetic scan (MRI) or computerized X-ray scan (CT) to find out the cause of the problem. TREATMENT   Sometimes treatments such as postural changes or nonsteroidal anti-inflammatory drugs will relieve the pain.  Nonsteroidal anti-inflammatory medications may help relieve symptoms. These medicines do this by decreasing swelling and inflammation in the nerves.  When stenosis causes severe nerve root compression, conservative  treatment may not be enough to maintain a normal lifestyle. Surgery may be recommended to relieve the pressure on affected nerves. In properly selected patients, the results are very good, and patients are able to continue a normal lifestyle. HOME CARE INSTRUCTIONS   Flexing the spine by leaning forward while walking may relieve symptoms. Lying with the knees drawn up to the chest may offer some relief. These positions enlarge the space available  to the nerves. They may make it easier for stenosis sufferers to walk longer distances.  Rest, followed by gradually resuming activity, also can help.  Aerobic activity, such as bicycling or swimming, is often recommended.  Losing weight can also relieve some of the load on the spine.  Application of warm or cold compresses to the area of pain can be helpful. SEEK MEDICAL CARE IF:   The periods of relief between episodes of pain become shorter and shorter.  You experience pain that radiates down your leg, even when you are not standing or walking. SEEK IMMEDIATE MEDICAL CARE IF:   You have a loss of bowel or bladder control.  You have a sudden loss of feeling in your legs.  You suddenly cannot move your legs. Document Released: 06/14/2003 Document Revised: 06/16/2011 Document Reviewed: 08/09/2009 [facility] Patient Information 2014 [place], [place].

## 2013-01-12 NOTE — Assessment & Plan Note (Signed)
Chart review reveals a chronic normocytic anemia. The patient status post hysterectomy.  Iron studies in January 2014 showed no evidence of iron deficiency.  The patient notes no symptoms of weakness, fatigue, or lightheadedness. We did not have time to fully explore this issue today, can address at future visits.

## 2013-01-12 NOTE — Assessment & Plan Note (Signed)
The patient has a history of depression, noting that she has been on antidepressants since age 47, and has been on Celexa for the last 4 years.  She notes that her mood is somewhat well controlled on Celexa though she occasionally feels it is not enough. We discussed options including changing to a different SSRI, or referring to psychiatry. The patient prefers to stick with Celexa for now, but may want to change to another SSRI in the future. She declines psychiatry, citing financial concerns. -Refilled Celexa

## 2013-01-13 NOTE — Progress Notes (Signed)
Case discussed with Dr. Vierling at the time of the visit.  We reviewed the resident's history and exam and pertinent patient test results.  I agree with the assessment, diagnosis, and plan of care documented in the resident's note. 

## 2013-09-18 ENCOUNTER — Encounter (HOSPITAL_COMMUNITY): Payer: Self-pay | Admitting: Emergency Medicine

## 2013-09-18 ENCOUNTER — Emergency Department (HOSPITAL_COMMUNITY): Payer: BC Managed Care – PPO

## 2013-09-18 ENCOUNTER — Emergency Department (HOSPITAL_COMMUNITY)
Admission: EM | Admit: 2013-09-18 | Discharge: 2013-09-18 | Disposition: A | Discharge status: Home / Self Care | Payer: BC Managed Care – PPO | Attending: Emergency Medicine | Admitting: Emergency Medicine

## 2013-09-18 DIAGNOSIS — Z79899 Other long term (current) drug therapy: Secondary | ICD-10-CM | POA: Insufficient documentation

## 2013-09-18 DIAGNOSIS — S93409A Sprain of unspecified ligament of unspecified ankle, initial encounter: Secondary | ICD-10-CM

## 2013-09-18 DIAGNOSIS — F3289 Other specified depressive episodes: Secondary | ICD-10-CM | POA: Insufficient documentation

## 2013-09-18 DIAGNOSIS — F17200 Nicotine dependence, unspecified, uncomplicated: Secondary | ICD-10-CM | POA: Insufficient documentation

## 2013-09-18 DIAGNOSIS — F329 Major depressive disorder, single episode, unspecified: Secondary | ICD-10-CM | POA: Insufficient documentation

## 2013-09-18 DIAGNOSIS — Z7982 Long term (current) use of aspirin: Secondary | ICD-10-CM | POA: Insufficient documentation

## 2013-09-18 DIAGNOSIS — Z8619 Personal history of other infectious and parasitic diseases: Secondary | ICD-10-CM | POA: Insufficient documentation

## 2013-09-18 DIAGNOSIS — J449 Chronic obstructive pulmonary disease, unspecified: Secondary | ICD-10-CM | POA: Insufficient documentation

## 2013-09-18 DIAGNOSIS — Z862 Personal history of diseases of the blood and blood-forming organs and certain disorders involving the immune mechanism: Secondary | ICD-10-CM | POA: Insufficient documentation

## 2013-09-18 DIAGNOSIS — J4489 Other specified chronic obstructive pulmonary disease: Secondary | ICD-10-CM | POA: Insufficient documentation

## 2013-09-18 DIAGNOSIS — Y929 Unspecified place or not applicable: Secondary | ICD-10-CM | POA: Insufficient documentation

## 2013-09-18 DIAGNOSIS — Z8701 Personal history of pneumonia (recurrent): Secondary | ICD-10-CM | POA: Insufficient documentation

## 2013-09-18 DIAGNOSIS — Y9389 Activity, other specified: Secondary | ICD-10-CM | POA: Insufficient documentation

## 2013-09-18 DIAGNOSIS — Z8742 Personal history of other diseases of the female genital tract: Secondary | ICD-10-CM | POA: Insufficient documentation

## 2013-09-18 DIAGNOSIS — X58XXXA Exposure to other specified factors, initial encounter: Secondary | ICD-10-CM | POA: Insufficient documentation

## 2013-09-18 MED ORDER — HYDROMORPHONE HCL PF 1 MG/ML IJ SOLN
2.0000 mg | Freq: Once | INTRAMUSCULAR | Status: AC
  Administered 2013-09-18: 2 mg via INTRAMUSCULAR
  Filled 2013-09-18: qty 2

## 2013-09-18 MED ORDER — HYDROCODONE-ACETAMINOPHEN 5-325 MG PO TABS: 1.0000 | ORAL_TABLET | ORAL | Status: DC | PRN

## 2013-09-18 MED ORDER — ONDANSETRON 4 MG PO TBDP
8.0000 mg | ORAL_TABLET | Freq: Once | ORAL | Status: AC
  Administered 2013-09-18: 8 mg via ORAL
  Filled 2013-09-18: qty 2

## 2013-09-18 NOTE — ED Notes (Signed)
Patient transported to X-ray 

## 2013-09-18 NOTE — ED Notes (Signed)
Ortho paged to apply Cam walker
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
## 2013-09-18 NOTE — Discharge Instructions (Signed)
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
## 2014-02-06 ENCOUNTER — Encounter (HOSPITAL_COMMUNITY): Payer: Self-pay | Admitting: Emergency Medicine

## 2014-03-10 ENCOUNTER — Ambulatory Visit (INDEPENDENT_AMBULATORY_CARE_PROVIDER_SITE_OTHER): Payer: BC Managed Care – PPO | Admitting: Internal Medicine

## 2014-03-10 ENCOUNTER — Encounter: Payer: BC Managed Care – PPO | Admitting: Internal Medicine

## 2014-03-10 ENCOUNTER — Encounter: Payer: Self-pay | Admitting: Internal Medicine

## 2014-03-10 VITALS — BP 121/91 | HR 98 | Temp 98.3°F | Wt 161.6 lb

## 2014-03-10 DIAGNOSIS — F32A Depression, unspecified: Secondary | ICD-10-CM

## 2014-03-10 DIAGNOSIS — S46811A Strain of other muscles, fascia and tendons at shoulder and upper arm level, right arm, initial encounter: Secondary | ICD-10-CM

## 2014-03-10 DIAGNOSIS — Z23 Encounter for immunization: Secondary | ICD-10-CM | POA: Diagnosis not present

## 2014-03-10 DIAGNOSIS — R7309 Other abnormal glucose: Secondary | ICD-10-CM

## 2014-03-10 DIAGNOSIS — F329 Major depressive disorder, single episode, unspecified: Secondary | ICD-10-CM | POA: Diagnosis not present

## 2014-03-10 DIAGNOSIS — J449 Chronic obstructive pulmonary disease, unspecified: Secondary | ICD-10-CM

## 2014-03-10 DIAGNOSIS — D649 Anemia, unspecified: Secondary | ICD-10-CM | POA: Diagnosis not present

## 2014-03-10 DIAGNOSIS — Z0000 Encounter for general adult medical examination without abnormal findings: Secondary | ICD-10-CM

## 2014-03-10 DIAGNOSIS — R7303 Prediabetes: Secondary | ICD-10-CM

## 2014-03-10 DIAGNOSIS — Z720 Tobacco use: Secondary | ICD-10-CM

## 2014-03-10 LAB — COMPLETE METABOLIC PANEL WITH GFR
ALT: 18 U/L (ref 0–35)
AST: 16 U/L (ref 0–37)
Albumin: 4 g/dL (ref 3.5–5.2)
Alkaline Phosphatase: 66 U/L (ref 39–117)
BILIRUBIN TOTAL: 0.3 mg/dL (ref 0.2–1.2)
BUN: 16 mg/dL (ref 6–23)
CHLORIDE: 101 meq/L (ref 96–112)
CO2: 32 meq/L (ref 19–32)
CREATININE: 0.79 mg/dL (ref 0.50–1.10)
Calcium: 9.5 mg/dL (ref 8.4–10.5)
GFR, Est Non African American: 88 mL/min
Glucose, Bld: 87 mg/dL (ref 70–99)
Potassium: 4.1 mEq/L (ref 3.5–5.3)
SODIUM: 139 meq/L (ref 135–145)
TOTAL PROTEIN: 6.7 g/dL (ref 6.0–8.3)

## 2014-03-10 MED ORDER — ALBUTEROL SULFATE HFA 108 (90 BASE) MCG/ACT IN AERS: 2.0000 | INHALATION_SPRAY | Freq: Four times a day (QID) | RESPIRATORY_TRACT | Status: DC | PRN

## 2014-03-10 MED ORDER — TIOTROPIUM BROMIDE MONOHYDRATE 18 MCG IN CAPS: 18.0000 ug | ORAL_CAPSULE | Freq: Every day | RESPIRATORY_TRACT | Status: DC

## 2014-03-10 MED ORDER — CYCLOBENZAPRINE HCL 7.5 MG PO TABS: 7.5000 mg | ORAL_TABLET | Freq: Every evening | ORAL | Status: DC | PRN

## 2014-03-10 MED ORDER — IBUPROFEN 200 MG PO TABS: 200.0000 mg | ORAL_TABLET | Freq: Four times a day (QID) | ORAL | Status: DC | PRN

## 2014-03-10 NOTE — Patient Instructions (Addendum)
-  Take spiriva everyday for your COPD, take albuterol only as needed for shortness of breath and wheezing  -Take flexiril every night as needed for muscle spasms, it will also help you sleep, don't take it if you have to drive or work in the day -Will order colonoscopy and mammogram -Will give you a flu shot and tdap shot today -Will order bloodwork and call you with the results -Wean slowly off celexa  -Nice meeting you today!!  General Instructions:   Please bring your medicines with you each time you come to clinic.  Medicines may include prescription medications, over-the-counter medications, herbal remedies, eye drops, vitamins, or other pills.   Progress Toward Treatment Goals:  Treatment Goal 01/12/2013  Stop smoking smoking less    Self Care Goals & Plans:  Self Care Goal 01/12/2013  Manage my medications take my medicines as prescribed; bring my medications to every visit; refill my medications on time; follow the sick day instructions if I am sick  Eat healthy foods eat more vegetables; eat fruit for snacks and desserts; eat baked foods instead of fried foods  Be physically active take a walk every day; find an activity I enjoy  Stop smoking -    No flowsheet data found.   Care Management & Community Referrals:  Referral 01/12/2013  Referrals made for care management support none needed

## 2014-03-10 NOTE — Progress Notes (Signed)
Patient ID: Patricia Farrell, female   DOB: [birth year], 48 y.o.   MRN: [id number]    Subjective:   Patient ID: Patricia Farrell female   DOB: 10-03-1965 48 y.o.   MRN: [id number]  HPI: [name] is a 48 y.o. pleasant woman with past medical history of Gold Stage 2 COPD, depression, chronic low back pain, and tobacco use who presents for routine clinic visit.   She has Gold Stage 2 COPD per last PFT's in March of 2014. She denies recent exacerbation. She has occasional dry cough, dyspnea, and wheezing usually at night. She was unable to obtain albuterol inhaler at last visit due to lack of insurance in the past. She continues to smoke cigarettes, approximately 0.5 pack per day but has cut back from 1 ppd and is continuing to cut back.    She has a long history of depression and on anti-depressant therapy since age 48. She has been weaning herself off celexa 40 mg daily and now is on 10 mg daily with improvement of her mood. She has had a lot of stress in her life in the past year with her father's passing and caring for her aging mother. She has insomnia but denies loss of interest in activities, change in appetite, or weight change. She does not want to be on anti-depressant therapy or see psychiatry.   She reports having right sided neck pain with radiation to the right side of her head and shoulder for the past 2 weeks with recent right upper molar tooth extraction (instead of root canal) and 10 day antibiotic course. She occasionally uses OTC ibuprofen as needed for pain but has not tried muscle relaxant. 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 She denies recent injury, trauma, or fall. She has never had this in the past.   She has history of normocytic anemia with last Hg of 10.9. She is s/p hysterectomy in 1991 and is due for screening colonoscopy this year which she is agreeable to. She denies GI or GU bleeding. She is not on iron  supplements.    She would like to have annual influenza vaccination and tdap vaccination today in addition to screening mammography.      Past Medical History  Diagnosis Date  . Depression   . Headache(784.0)   . Acute bronchopneumonia 04/17/2012  . COPD (chronic obstructive pulmonary disease) 04/19/2012  . Candida infection, esophageal 04/30/2012    Possible candidal esophagitis   . Endometriosis   . Normocytic anemia 01/12/2013   Current Outpatient Prescriptions  Medication Sig Dispense Refill  . acetaminophen (TYLENOL) 500 MG tablet Take 1 tablet (500 mg total) by mouth every 6 (six) hours as needed. For pain. 30 tablet 0  . albuterol (PROVENTIL HFA;VENTOLIN HFA) 108 (90 BASE) MCG/ACT inhaler Inhale 2 puffs into the lungs every 6 (six) hours as needed for wheezing. 1 Inhaler 2  . aspirin EC 81 MG tablet Take 81 mg by mouth daily.      . calcium carbonate (OS-CAL - DOSED IN MG OF ELEMENTAL CALCIUM) 1250 MG tablet Take 1 tablet by mouth daily.      . citalopram (CELEXA) 40 MG tablet Take 1 tablet (40 mg total) by mouth daily. 30 tablet 11  . Cyanocobalamin (VITAMIN B-12 CR PO) Take 1 tablet by mouth daily.    . fish oil-omega-3 fatty acids 1000 MG capsule Take 2 g by mouth daily.      Marland Kitchen  HYDROcodone-acetaminophen (NORCO) 5-325 MG per tablet Take 1 tablet by mouth every 4 (four) hours as needed. 20 tablet 0  . ibuprofen (ADVIL,MOTRIN) 200 MG tablet Take 200 mg by mouth every 6 (six) hours as needed for pain.    . Multiple Vitamins-Minerals (MULTIVITAMINS THER. W/MINERALS) TABS Take 1 tablet by mouth daily.       No current facility-administered medications for this visit.   Family History  Problem Relation Age of Onset  . Diabetes Sister   . Diabetes Brother    History   Social History  . Marital Status: Single    Spouse Name: N/A    Number of Children: N/A  . Years of Education: N/A   Social History Main Topics  . Smoking status: Current Every Day Smoker -- 0.50 packs/day  for 30 years    Types: Cigarettes    Last Attempt to Quit: 04/13/2012  . Smokeless tobacco: Former NeurosurgeonUser    Quit date: 04/13/2012     Comment: have electronic cigarette  . Alcohol Use: 3.6 oz/week    4 Glasses of wine, 2 Cans of beer per week     Comment: Denies current heavy EtOH, says a few drinks per week. Endorses prior heavy use.   . Drug Use: No     Comment: Patient daughter in room and patient distressed. Will discuss drug use at later evaluation.  . Sexual Activity: Yes    Birth Control/ Protection: None, Surgical   Other Topics Concern  . Not on file   Social History Narrative   Works at [employer].    Review of Systems: Review of Systems  Constitutional: Negative for fever, chills and weight loss.       Hot flashes  HENT:       No tooth pain.  Eyes: Negative for blurred vision.  Respiratory: Positive for cough (dry), shortness of breath (at night) and wheezing (at night).   Cardiovascular: Negative for chest pain, palpitations and leg swelling.  Gastrointestinal: Negative for nausea, vomiting, abdominal pain, diarrhea, constipation and blood in stool.  Genitourinary: Negative for dysuria, urgency and frequency.  Musculoskeletal: Positive for back pain (chronic low back) and neck pain (right sided). Negative for joint pain.       Right shoulder pain/stiffness  Skin: Negative for rash.  Neurological: Negative for dizziness and headaches.  Endo/Heme/Allergies: Does not bruise/bleed easily.  Psychiatric/Behavioral: Positive for depression and substance abuse (tobacco). The patient is nervous/anxious and has insomnia.     Objective:  Physical Exam: Filed Vitals:   03/10/14 1614 03/10/14 1709  BP: 133/116 121/91  Pulse: 90 98  Temp: 98.3 F (36.8 C)   TempSrc: Oral   Weight: 161 lb 9.6 oz (73.301 kg)   SpO2: 99%     Physical Exam  Constitutional: She is oriented to person, place, and time. She appears well-developed and well-nourished. No distress.  HENT:    Head: Normocephalic and atraumatic.  Right Ear: External ear normal.  Left Ear: External ear normal.  Nose: Nose normal.  Mouth/Throat: Oropharynx is clear and moist. No oropharyngeal exudate.  Right upper molar extraction with no signs of infection  Eyes: Conjunctivae and EOM are normal. Pupils are equal, round, and reactive to light. Right eye exhibits no discharge. Left eye exhibits no discharge. No scleral icterus.  Neck: Normal range of motion. Neck supple.  No neck stiffness.   Cardiovascular: Normal rate, regular rhythm and normal heart sounds.   No murmur heard. Pulmonary/Chest: Effort normal. No respiratory distress.  She has no wheezes. She has no rales.  Increased posterior breath sounds  Abdominal: Soft. Bowel sounds are normal. She exhibits no distension. There is no tenderness. There is no rebound and no guarding.  Musculoskeletal: Normal range of motion. She exhibits no edema or tenderness.  Normal ROM of shoulder. Tightening of right trapezius muscle.  Neurological: She is alert and oriented to person, place, and time. She exhibits normal muscle tone. Coordination normal.  Normal 5/5 muscle strength and normal sensation to light touch throughout.  Skin: Skin is warm and dry. No rash noted. She is not diaphoretic. No erythema. No pallor.  Psychiatric: Her behavior is normal. Judgment and thought content normal.  Tearful at times     Assessment & Plan:   Please see problem list for problem-based assessment and plan

## 2014-03-11 ENCOUNTER — Encounter: Payer: Self-pay | Admitting: Internal Medicine

## 2014-03-11 DIAGNOSIS — R7303 Prediabetes: Secondary | ICD-10-CM | POA: Insufficient documentation

## 2014-03-11 DIAGNOSIS — S46811A Strain of other muscles, fascia and tendons at shoulder and upper arm level, right arm, initial encounter: Secondary | ICD-10-CM | POA: Insufficient documentation

## 2014-03-11 HISTORY — DX: Strain of other muscles, fascia and tendons at shoulder and upper arm level, right arm, initial encounter: S46.811A

## 2014-03-11 LAB — ANEMIA PANEL
%SAT: 33 % (ref 20–55)
ABS RETIC: 64.7 10*3/uL (ref 19.0–186.0)
Ferritin: 146 ng/mL (ref 10–291)
Folate: >20 ng/mL
IRON: 103 ug/dL (ref 42–145)
RBC.: 4.62 MIL/uL (ref 3.87–5.11)
RETIC CT PCT: 1.4 % (ref 0.4–2.3)
TIBC: 309 ug/dL (ref 250–470)
UIBC: 206 ug/dL (ref 125–400)
Vitamin B-12: 628 pg/mL (ref 211–911)

## 2014-03-11 LAB — CBC
HCT: 42.1 % (ref 36.0–46.0)
HEMOGLOBIN: 14.2 g/dL (ref 12.0–15.0)
MCH: 30.7 pg (ref 26.0–34.0)
MCHC: 33.7 g/dL (ref 30.0–36.0)
MCV: 91.1 fL (ref 78.0–100.0)
MPV: 10.9 fL (ref 9.4–12.4)
Platelets: 246 10*3/uL (ref 150–400)
RBC: 4.62 MIL/uL (ref 3.87–5.11)
RDW: 13.3 % (ref 11.5–15.5)
WBC: 12.4 10*3/uL — ABNORMAL HIGH (ref 4.0–10.5)

## 2014-03-11 LAB — HEMOGLOBIN A1C
Hgb A1c MFr Bld: 5.5 % (ref <5.7)
MEAN PLASMA GLUCOSE: 111 mg/dL (ref <117)

## 2014-03-11 LAB — HIV ANTIBODY (ROUTINE TESTING W REFLEX): HIV: NONREACTIVE

## 2014-03-11 LAB — TSH: TSH: 1.222 u[IU]/mL (ref 0.350–4.500)

## 2014-03-11 NOTE — Assessment & Plan Note (Signed)
Assessment: Pt with history of pre-diabetes in 2014 with A1c of 6 who presents with no symptoms of hyperglycemia found to have improved A1c of 5.5.   Plan:  -Obtain A1c ---> 5.5 -Obtain CMP ---> normal  -Continue lifestyle modification

## 2014-03-11 NOTE — Assessment & Plan Note (Signed)
Assessment: Pt is s/p hysterectomy with history of normocytic anemia in 2014 most likely in setting of sepsis who presents with no active bleeding or hemodynamic instability.   Plan:  -Obtain CBC and anemia panel---> normal

## 2014-03-11 NOTE — Assessment & Plan Note (Addendum)
-  Referral placed to GI for screening colonoscopy and annual mammogram.  -Pt received tdap and annual influenza vaccination today on 03/10/14.  -Obtain annual HIV Ab ---> non-reactive

## 2014-03-11 NOTE — Assessment & Plan Note (Addendum)
Assessment: Pt is a chronic tobacco user, currently smoking 0.5 ppd who presents with plans to quit smoking.   Plan:  -Pt counseled on tobacco cessation and aware of resources to help quit -Will consider starting wellbutrin at next visit for concomitant depression if she is agreeable

## 2014-03-11 NOTE — Assessment & Plan Note (Addendum)
Assessment: Pt with long history of major depressive disorder with desire to quit anti-depressant therapy who presents with stable mood in setting of recent life stressors.   Plan:  -Pt declined changing to alternative anti-depressant therapy or referral to mental health at this time -Pt is self tapering herself off citalopram 40 mg daily, currently on 10 mg daily -Pt instructed to return if she has withdrawal symptoms -Obtain TSH ---> normal  -Consider wellbutrin for concomitant tobacco use at next visit

## 2014-03-11 NOTE — Assessment & Plan Note (Signed)
Assessment:  Pt with 2-week history of right head/neck/shoulder stiffness/pain after work-related overuse injury most likely due to right sided trapezius muscle strain.    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

## 2014-03-11 NOTE — Assessment & Plan Note (Signed)
Assessment: Pt is chronic cigarette smoker with Gold Stage 2 COPD not on maintenance therapy who presents with no recent exacerbation.   Plan:  -Prescribe spiriva 18 mcg inhaler daily for maintenance therapy  -Prescribe albuterol inhaler 2 puffs Q 6 hr PRN acute bronchospasm -Pt counseled on tobacco cessation  -Administer annual influenza vaccination

## 2014-03-13 NOTE — Progress Notes (Signed)
Internal Medicine Clinic Attending  Date of Visit: 03/10/2014  Case discussed with Dr. Johna Rolesabbani soon after the resident saw the patient.  We reviewed the resident's history and exam and pertinent patient test results.  I agree with the assessment, diagnosis, and plan of care documented in the resident's note.

## 2014-03-14 ENCOUNTER — Telehealth: Payer: Self-pay | Admitting: *Deleted

## 2014-03-14 DIAGNOSIS — S46811A Strain of other muscles, fascia and tendons at shoulder and upper arm level, right arm, initial encounter: Secondary | ICD-10-CM

## 2014-03-14 MED ORDER — CYCLOBENZAPRINE HCL 10 MG PO TABS: 10.0000 mg | ORAL_TABLET | Freq: Every evening | ORAL | Status: DC | PRN

## 2014-03-14 NOTE — Telephone Encounter (Signed)
Received fax from pt's pharmacy-cyclobenzaprine 7.5mg  tabs is over $100, pharmacy asked if dose could be changed to 10mg  tabs.  Will send to pcp for review. Please advise.Kingsley SpittleGoldston, Adilenne Ashworth Cassady12/8/201512:01 PM

## 2014-03-14 NOTE — Telephone Encounter (Signed)
Will change to cyclobenazaprine 10 mg PRN bedtime. Thanks!  Dr. Johna Rolesabbani

## 2014-04-17 ENCOUNTER — Other Ambulatory Visit: Payer: Self-pay | Admitting: *Deleted

## 2014-04-17 DIAGNOSIS — S46811A Strain of other muscles, fascia and tendons at shoulder and upper arm level, right arm, initial encounter: Secondary | ICD-10-CM

## 2014-04-17 NOTE — Telephone Encounter (Signed)
Pt also stated she wanted something to help her sleep, she has an appt 1/19 with you to discuss this

## 2014-04-18 MED ORDER — CYCLOBENZAPRINE HCL 10 MG PO TABS: 10.0000 mg | ORAL_TABLET | Freq: Every evening | ORAL | Status: DC | PRN

## 2014-04-18 MED ORDER — CITALOPRAM HYDROBROMIDE 40 MG PO TABS: 40.0000 mg | ORAL_TABLET | Freq: Every day | ORAL | Status: DC

## 2014-04-25 ENCOUNTER — Encounter: Payer: Self-pay | Admitting: Internal Medicine

## 2014-04-25 ENCOUNTER — Ambulatory Visit: Payer: Self-pay | Admitting: Internal Medicine

## 2014-05-02 ENCOUNTER — Other Ambulatory Visit: Payer: Self-pay | Admitting: Internal Medicine

## 2014-05-02 MED ORDER — ZOLPIDEM TARTRATE 5 MG PO TABS: 5.0000 mg | ORAL_TABLET | Freq: Every evening | ORAL | Status: DC | PRN

## 2014-08-25 ENCOUNTER — Emergency Department (HOSPITAL_BASED_OUTPATIENT_CLINIC_OR_DEPARTMENT_OTHER)
Admission: EM | Admit: 2014-08-25 | Discharge: 2014-08-25 | Disposition: A | Discharge status: Home / Self Care | Payer: Self-pay | Attending: Emergency Medicine | Admitting: Emergency Medicine

## 2014-08-25 ENCOUNTER — Encounter (HOSPITAL_BASED_OUTPATIENT_CLINIC_OR_DEPARTMENT_OTHER): Payer: Self-pay | Admitting: Family Medicine

## 2014-08-25 DIAGNOSIS — Z8659 Personal history of other mental and behavioral disorders: Secondary | ICD-10-CM | POA: Insufficient documentation

## 2014-08-25 DIAGNOSIS — H7493 Unspecified disorder of middle ear and mastoid, bilateral: Secondary | ICD-10-CM | POA: Insufficient documentation

## 2014-08-25 DIAGNOSIS — H9203 Otalgia, bilateral: Secondary | ICD-10-CM

## 2014-08-25 DIAGNOSIS — Z720 Tobacco use: Secondary | ICD-10-CM | POA: Insufficient documentation

## 2014-08-25 DIAGNOSIS — J449 Chronic obstructive pulmonary disease, unspecified: Secondary | ICD-10-CM | POA: Insufficient documentation

## 2014-08-25 DIAGNOSIS — Z8619 Personal history of other infectious and parasitic diseases: Secondary | ICD-10-CM | POA: Insufficient documentation

## 2014-08-25 DIAGNOSIS — Z87828 Personal history of other (healed) physical injury and trauma: Secondary | ICD-10-CM | POA: Insufficient documentation

## 2014-08-25 DIAGNOSIS — J302 Other seasonal allergic rhinitis: Secondary | ICD-10-CM | POA: Insufficient documentation

## 2014-08-25 DIAGNOSIS — Z8742 Personal history of other diseases of the female genital tract: Secondary | ICD-10-CM | POA: Insufficient documentation

## 2014-08-25 NOTE — Discharge Instructions (Signed)
You can try sudafed for symptomatic relief along with an antihistamine (benadryl at night and claritin/allegra/zyrtec during the day).  You can also try afrin nasal spray, however do not use this for more than 3 consecutive days Allergic Rhinitis Allergic rhinitis is when the mucous membranes in the nose respond to allergens. Allergens are particles in the air that cause your body to have an allergic reaction. This causes you to release allergic antibodies. Through a chain of events, these eventually cause you to release histamine into the blood stream. Although meant to protect the body, it is this release of histamine that causes your discomfort, such as frequent sneezing, congestion, and an itchy, runny nose.  CAUSES  Seasonal allergic rhinitis (hay fever) is caused by pollen allergens that may come from grasses, trees, and weeds. Year-round allergic rhinitis (perennial allergic rhinitis) is caused by allergens such as house dust mites, pet dander, and mold spores.  SYMPTOMS   Nasal stuffiness (congestion).  Itchy, runny nose with sneezing and tearing of the eyes. DIAGNOSIS  Your health care provider can help you determine the allergen or allergens that trigger your symptoms. If you and your health care provider are unable to determine the allergen, skin or blood testing may be used. TREATMENT  Allergic rhinitis does not have a cure, but it can be controlled by:  Medicines and allergy shots (immunotherapy).  Avoiding the allergen. Hay fever may often be treated with antihistamines in pill or nasal spray forms. Antihistamines block the effects of histamine. There are over-the-counter medicines that may help with nasal congestion and swelling around the eyes. Check with your health care provider before taking or giving this medicine.  If avoiding the allergen or the medicine prescribed do not work, there are many new medicines your health care provider can prescribe. Stronger medicine may be used  if initial measures are ineffective. Desensitizing injections can be used if medicine and avoidance does not work. Desensitization is when a patient is given ongoing shots until the body becomes less sensitive to the allergen. Make sure you follow up with your health care provider if problems continue. HOME CARE INSTRUCTIONS It is not possible to completely avoid allergens, but you can reduce your symptoms by taking steps to limit your exposure to them. It helps to know exactly what you are allergic to so that you can avoid your specific triggers. SEEK MEDICAL CARE IF:   You have a fever.  You develop a cough that does not stop easily (persistent).  You have shortness of breath.  You start wheezing.  Symptoms interfere with normal daily activities. Document Released: 12/17/2000 Document Revised: 03/29/2013 Document Reviewed: 11/29/2012 [facility]Exit[facility] Patient Information 2015 [employer] MooreExitCare, [place]. This information is not intended to replace advice given to you by your health care provider. Make sure you discuss any questions you have with your health care provider. Otalgia The most common reason for this in children is an infection of the middle ear. Pain from the middle ear is usually caused by a build-up of fluid and pressure behind the eardrum. Pain from an earache can be sharp, dull, or burning. The pain may be temporary or constant. The middle ear is connected to the nasal passages by a short narrow tube called the Eustachian tube. The Eustachian tube allows fluid to drain out of the middle ear, and helps keep the pressure in your ear equalized. CAUSES  A cold or allergy can block the Eustachian tube with inflammation and the build-up of secretions. This is especially  likely in small children, because their Eustachian tube is shorter and more horizontal. When the Eustachian tube closes, the normal flow of fluid from the middle ear is stopped. Fluid can accumulate and cause stuffiness, pain, hearing loss,  and an ear infection if germs start growing in this area. SYMPTOMS  The symptoms of an ear infection may include fever, ear pain, fussiness, increased crying, and irritability. Many children will have temporary and minor hearing loss during and right after an ear infection. Permanent hearing loss is rare, but the risk increases the more infections a child has. Other causes of ear pain include retained water in the outer ear canal from swimming and bathing. Ear pain in adults is less likely to be from an ear infection. Ear pain may be referred from other locations. Referred pain may be from the joint between your jaw and the skull. It may also come from a tooth problem or problems in the neck. Other causes of ear pain include:  A foreign body in the ear.  Outer ear infection.  Sinus infections.  Impacted ear wax.  Ear injury.  Arthritis of the jaw or TMJ problems.  Middle ear infection.  Tooth infections.  Sore throat with pain to the ears. DIAGNOSIS  Your caregiver can usually make the diagnosis by examining you. Sometimes other special studies, including x-rays and lab work may be necessary. TREATMENT   If antibiotics were prescribed, use them as directed and finish them even if you or your child's symptoms seem to be improved.  Sometimes PE tubes are needed in children. These are little plastic tubes which are put into the eardrum during a simple surgical procedure. They allow fluid to drain easier and allow the pressure in the middle ear to equalize. This helps relieve the ear pain caused by pressure changes. HOME CARE INSTRUCTIONS   Only take over-the-counter or prescription medicines for pain, discomfort, or fever as directed by your caregiver. DO NOT GIVE CHILDREN ASPIRIN because of the association of Reye's Syndrome in children taking aspirin.  Use a cold pack applied to the outer ear for 15-20 minutes, 03-04 times per day or as needed may reduce pain. Do not apply ice  directly to the skin. You may cause frost bite.  Over-the-counter ear drops used as directed may be effective. Your caregiver may sometimes prescribe ear drops.  Resting in an upright position may help reduce pressure in the middle ear and relieve pain.  Ear pain caused by rapidly descending from high altitudes can be relieved by swallowing or chewing gum. Allowing infants to suck on a bottle during airplane travel can help.  Do not smoke in the house or near children. If you are unable to quit smoking, smoke outside.  Control allergies. SEEK IMMEDIATE MEDICAL CARE IF:   You or your child are becoming sicker.  Pain or fever relief is not obtained with medicine.  You or your child's symptoms (pain, fever, or irritability) do not improve within 24 to 48 hours or as instructed.  Severe pain suddenly stops hurting. This may indicate a ruptured eardrum.  You or your children develop new problems such as severe headaches, stiff neck, difficulty swallowing, or swelling of the face or around the ear. Document Released: 11/09/2003 Document Revised: 06/16/2011 Document Reviewed: 03/15/2008 [facility]ExitCare Patient Information 2015 [place]ExitCare, [place]. This information is not intended to replace advice given to you by your health care provider. Make sure you discuss any questions you have with your health care provider.

## 2014-08-25 NOTE — ED Notes (Signed)
Pt c/o bilateral ear itching and pain x 1 wk.

## 2014-08-25 NOTE — ED Provider Notes (Signed)
CSN: [id number]     Arrival date & time 08/25/14  1402 History   First MD Initiated Contact with Patient 08/25/14 1414     Chief Complaint  Patient presents with  . Otalgia     (Consider location/radiation/quality/duration/timing/severity/associated sxs/prior Treatment) Patient is a 49 y.o. female presenting with ear pain.  Otalgia Location:  Bilateral Behind ear:  No abnormality Quality:  Aching (itching) Severity:  Moderate Onset quality:  Gradual Duration:  1 week Timing:  Constant Progression:  Worsening Chronicity:  New Relieved by:  Nothing Worsened by:  Nothing tried Ineffective treatments:  None tried Associated symptoms: congestion, cough (mild x 2 days) and rhinorrhea (with itchy and watery eyes)   Associated symptoms: no abdominal pain, no ear discharge, no fever and no sore throat     Past Medical History  Diagnosis Date  . Depression   . Headache(784.0)   . Acute bronchopneumonia 04/17/2012  . COPD (chronic obstructive pulmonary disease) 04/19/2012  . Candida infection, esophageal 04/30/2012    Possible candidal esophagitis   . Endometriosis   . Normocytic anemia 01/12/2013  . Strain of right trapezius muscle 03/11/2014   Past Surgical History  Procedure Laterality Date  . Tubal ligation    . Abdominal hysterectomy    . Cesarean section      one previous  . Fracture of both feet  2008   Family History  Problem Relation Age of Onset  . Diabetes Sister   . Diabetes Brother    History  Substance Use Topics  . Smoking status: Current Every Day Smoker -- 0.50 packs/day for 30 years    Types: Cigarettes    Last Attempt to Quit: 04/13/2012  . Smokeless tobacco: Former NeurosurgeonUser    Quit date: 04/13/2012     Comment: have electronic cigarette  . Alcohol Use: 3.6 oz/week    4 Glasses of wine, 2 Cans of beer per week     Comment: Denies current heavy EtOH, says a few drinks per week. Endorses prior heavy use.    OB History    Gravida Para Term Preterm AB TAB  SAB Ectopic Multiple Living   3 2 2  1   1  2      Review of Systems  Constitutional: Negative for fever.  HENT: Positive for congestion, ear pain and rhinorrhea (with itchy and watery eyes). Negative for ear discharge and sore throat.   Respiratory: Positive for cough (mild x 2 days).   Gastrointestinal: Negative for abdominal pain.  All other systems reviewed and are negative.     Allergies  Review of patient's allergies indicates no known allergies.  Home Medications   Prior to Admission medications   Medication Sig Start Date End Date Taking? Authorizing Provider  albuterol (PROVENTIL HFA;VENTOLIN HFA) 108 (90 BASE) MCG/ACT inhaler Inhale 2 puffs into the lungs every 6 (six) hours as needed for wheezing. 03/10/14   Otis BraceMarjan Rabbani, MD  aspirin EC 81 MG tablet Take 81 mg by mouth daily.      Historical Provider, MD  calcium carbonate (OS-CAL - DOSED IN MG OF ELEMENTAL CALCIUM) 1250 MG tablet Take 1 tablet by mouth daily.      Historical Provider, MD  citalopram (CELEXA) 40 MG tablet Take 1 tablet (40 mg total) by mouth daily. 04/18/14   Otis BraceMarjan Rabbani, MD  Cyanocobalamin (VITAMIN B-12 CR PO) Take 1 tablet by mouth daily.    Historical Provider, MD  cyclobenzaprine (FLEXERIL) 10 MG tablet Take 1 tablet (10 mg total) by  mouth at bedtime as needed for muscle spasms. 04/18/14   Otis BraceMarjan Rabbani, MD  fish oil-omega-3 fatty acids 1000 MG capsule Take 2 g by mouth daily.      Historical Provider, MD  HYDROcodone-acetaminophen (NORCO) 5-325 MG per tablet Take 1 tablet by mouth every 4 (four) hours as needed. 09/18/13   Mancel BaleElliott Wentz, MD  ibuprofen (ADVIL,MOTRIN) 200 MG tablet Take 1 tablet (200 mg total) by mouth every 6 (six) hours as needed. 03/10/14   Otis BraceMarjan Rabbani, MD  Multiple Vitamins-Minerals (MULTIVITAMINS THER. W/MINERALS) TABS Take 1 tablet by mouth daily.      Historical Provider, MD  tiotropium (SPIRIVA HANDIHALER) 18 MCG inhalation capsule Place 1 capsule (18 mcg total) into inhaler  and inhale daily. 03/10/14 03/10/15  Otis BraceMarjan Rabbani, MD  zolpidem (AMBIEN) 5 MG tablet Take 1 tablet (5 mg total) by mouth at bedtime as needed for sleep. 05/02/14   Marjan Rabbani, MD   BP 111/40 mmHg  Pulse 57  Temp(Src) 98.2 F (36.8 C) (Oral)  Resp 18  Ht 5\' 6"  (1.676 m)  Wt 175 lb (79.379 kg)  BMI 28.26 kg/m2  SpO2 100% Physical Exam  Constitutional: She is oriented to person, place, and time. She appears well-developed and well-nourished.  HENT:  Head: Normocephalic and atraumatic.  Right Ear: External ear normal. A middle ear effusion is present.  Left Ear: External ear normal. A middle ear effusion is present.  Eyes: Conjunctivae and EOM are normal. Pupils are equal, round, and reactive to light.  Neck: Normal range of motion. Neck supple.  Cardiovascular: Normal rate, regular rhythm, normal heart sounds and intact distal pulses.   Pulmonary/Chest: Effort normal and breath sounds normal.  Abdominal: Soft. Bowel sounds are normal. There is no tenderness.  Musculoskeletal: Normal range of motion.  Neurological: She is alert and oriented to person, place, and time.  Skin: Skin is warm and dry.  Vitals reviewed.   ED Course  Procedures (including critical care time) Labs Review Labs Reviewed - No data to display  Imaging Review No results found.   EKG Interpretation None      MDM   Final diagnoses:  Otalgia of both ears  Other seasonal allergic rhinitis    49 y.o. female with pertinent PMH of COPD presents with bil otalgia.  History and exam consistent with allergic rhinitis with bil effusions, no signs of AOM.  DC home with symptomatic therapy instructions and standard return precautions.    I have reviewed all laboratory and imaging studies if ordered as above  1. Otalgia of both ears   2. Other seasonal allergic rhinitis         Mirian MoMatthew Gentry, MD 08/25/14 [phone]

## 2014-11-09 ENCOUNTER — Ambulatory Visit: Payer: Self-pay | Admitting: Internal Medicine

## 2015-02-20 ENCOUNTER — Encounter: Payer: Self-pay | Admitting: Student

## 2015-03-14 ENCOUNTER — Ambulatory Visit: Payer: Self-pay | Admitting: Internal Medicine

## 2015-07-03 ENCOUNTER — Telehealth: Payer: Self-pay | Admitting: Internal Medicine

## 2015-07-03 ENCOUNTER — Encounter: Payer: Self-pay | Admitting: Internal Medicine

## 2015-07-03 NOTE — Telephone Encounter (Signed)
Sending patient a Letter to contact our office to make and appointment.

## 2015-08-07 ENCOUNTER — Other Ambulatory Visit: Payer: Self-pay | Admitting: Internal Medicine

## 2015-08-07 NOTE — Telephone Encounter (Signed)
Last appointment 03/20/2014. Multiple no shows. Letter sent to patient 07/03/2015 to have patient schedule an appointment.

## 2015-08-10 ENCOUNTER — Other Ambulatory Visit: Payer: Self-pay | Admitting: Internal Medicine

## 2015-12-28 ENCOUNTER — Ambulatory Visit: Payer: Self-pay

## 2015-12-31 ENCOUNTER — Ambulatory Visit (INDEPENDENT_AMBULATORY_CARE_PROVIDER_SITE_OTHER): Payer: Self-pay | Admitting: Internal Medicine

## 2015-12-31 ENCOUNTER — Other Ambulatory Visit: Payer: Self-pay | Admitting: Pharmacist

## 2015-12-31 VITALS — BP 124/82 | HR 82 | Temp 97.8°F | Ht 63.0 in | Wt 166.1 lb

## 2015-12-31 DIAGNOSIS — Z720 Tobacco use: Secondary | ICD-10-CM

## 2015-12-31 DIAGNOSIS — Z597 Insufficient social insurance and welfare support: Secondary | ICD-10-CM

## 2015-12-31 DIAGNOSIS — F32A Depression, unspecified: Secondary | ICD-10-CM

## 2015-12-31 DIAGNOSIS — F17210 Nicotine dependence, cigarettes, uncomplicated: Secondary | ICD-10-CM

## 2015-12-31 DIAGNOSIS — K219 Gastro-esophageal reflux disease without esophagitis: Secondary | ICD-10-CM

## 2015-12-31 DIAGNOSIS — F329 Major depressive disorder, single episode, unspecified: Secondary | ICD-10-CM

## 2015-12-31 DIAGNOSIS — R1013 Epigastric pain: Secondary | ICD-10-CM

## 2015-12-31 DIAGNOSIS — F418 Other specified anxiety disorders: Secondary | ICD-10-CM

## 2015-12-31 DIAGNOSIS — J449 Chronic obstructive pulmonary disease, unspecified: Secondary | ICD-10-CM

## 2015-12-31 MED ORDER — ALBUTEROL SULFATE HFA 108 (90 BASE) MCG/ACT IN AERS: 2.0000 | INHALATION_SPRAY | Freq: Four times a day (QID) | RESPIRATORY_TRACT | 2 refills | Status: DC | PRN

## 2015-12-31 MED ORDER — CITALOPRAM HYDROBROMIDE 40 MG PO TABS: 40.0000 mg | ORAL_TABLET | Freq: Every day | ORAL | 3 refills | Status: DC

## 2015-12-31 MED ORDER — OMEPRAZOLE 40 MG PO CPDR: 40.0000 mg | DELAYED_RELEASE_CAPSULE | Freq: Every day | ORAL | 1 refills | Status: DC

## 2015-12-31 MED ORDER — DM-GUAIFENESIN ER 30-600 MG PO TB12: 1.0000 | ORAL_TABLET | Freq: Two times a day (BID) | ORAL | 0 refills | Status: DC | PRN

## 2015-12-31 MED ORDER — ALPRAZOLAM ER 0.5 MG PO TB24: 0.5000 mg | ORAL_TABLET | Freq: Every day | ORAL | 0 refills | Status: DC

## 2015-12-31 MED ORDER — TIOTROPIUM BROMIDE MONOHYDRATE 18 MCG IN CAPS: 18.0000 ug | ORAL_CAPSULE | Freq: Every day | RESPIRATORY_TRACT | 2 refills | Status: DC

## 2015-12-31 MED FILL — ALPRAZolam ER 0.5 MG TB24: 0.5 | 5 days supply | Qty: 5 | Fill #0

## 2015-12-31 MED FILL — OMEPRAZOLE DR 40 MG CAPSULE: 40 | 30 days supply | Qty: 30 | Fill #0

## 2015-12-31 MED FILL — SPIRIVA 18 MCG CP-HANDIHALE: 18 | 30 days supply | Qty: 30 | Fill #0

## 2015-12-31 MED FILL — CITALOPRAM HBR 40 MG TABLET: 40 | 30 days supply | Qty: 30 | Fill #0

## 2015-12-31 MED FILL — PROVENTIL HFA 90 MCG INH: 108 (90 BAS | 25 days supply | Qty: 7 | Fill #0

## 2015-12-31 NOTE — Progress Notes (Signed)
   CC: Epigastric pain for about a month.  HPI:  [name] is a 50 y.o. with past medical history as listed below, presented to the clinic today with complaint of epigastric pain going for about a month, it's mostly burning in character, also complained of some retrosternal  Burning, associated with some mild dyspepsia, but denies any nausea or vomiting. She states that her pain get worse with eating fatty and spicy food. She denies any change in her bowel movements or recent change in her appetite .She has tried Pepcid which improves her symptoms but she never got complete relief with that. She also complained of worsening cough with clear sputum for the last 1 month. She has an history of subjective fever but she was afebrile today. She complains of shortness of breath which is relieved after using nebulizer treatments for her COPD. She states that she ran out of her nebulizer treatments, she has no insurance and cannot afford the inhalers. She also complained of anxiety, and wants to get some Xanax for when necessary use. She has tried her mom's Xanax recently. She is on Celexa 40 mg daily.  Past Medical History:  Diagnosis Date  . Acute bronchopneumonia 04/17/2012  . Candida infection, esophageal (HCC) 04/30/2012   Possible candidal esophagitis   . COPD (chronic obstructive pulmonary disease) (HCC) 04/19/2012  . Depression   . Endometriosis   . Headache(784.0)   . Normocytic anemia 01/12/2013  . Strain of right trapezius muscle 03/11/2014    Review of Systems:  AS per HPI.  Physical Exam:  Vitals:   12/31/15 0907  BP: 124/82  Pulse: 82  Temp: 97.8 F (36.6 C)  TempSrc: Oral  SpO2: 99%  Weight: 166 lb 1.6 oz (75.3 kg)  Height: 5\' 3"  (1.6 m)   Gen. Well-developed, well-nourished lady, in no acute distress. Abdomen. Mild epigastric tenderness along with some right upper quadrant and left upper quadrant tenderness, abdomen was soft, non distended, no guarding or rebound,  Murphy sign was negative. Bowel sounds positive in all 4 quadrants. Lungs. Few scattered expiratory crackles. CV. Normal rate and rhythm, no murmur rub or gallop. Extremities. No edema, no cyanosis, pulses 2+ bilaterally.  Assessment & Plan:   See Encounters Tab for problem based charting.  Patient seen with Dr. Rogelia BogaButcher.

## 2015-12-31 NOTE — Assessment & Plan Note (Signed)
She also complained of worsening cough with clear sputum for the last 1 month. She has an history of subjective fever but she was afebrile today. She complains of shortness of breath which is relieved after using nebulizer treatments for her COPD. She states that she ran out of her nebulizer treatments, she has no insurance and cannot afford the inhalers. She has a diagnosis of Gold stage II COPD.  We provided her with prescription of Proventil and Spiriva which [facility]. Mucinex twice a day for this exacerbation of cough. Her GERD might be playing a role in her cough too.

## 2015-12-31 NOTE — Patient Instructions (Addendum)
Thank you for visiting the clinic today. I'm giving you omeprazole for your stomach pain, looks like you're having a GERD. I'm also providing you medicine for your COPD. Use your inhalers as directed. You can take Mucinex DM twice daily for your coughing. You can try some Tylenol for pain if needed. [facility]. I want you to make an appointment with your PCP with the next couple of weeks. During that appointment we will go over your anxiety and all just screening testing. You can combine back if your symptoms get worse.

## 2015-12-31 NOTE — Assessment & Plan Note (Signed)
She also complained of anxiety, and wants to get some Xanax for when necessary use. She has tried her mom's Xanax recently. She is on Celexa 40 mg daily.  I gave her a prescription of 0.5 mg daily for 5 tablets. She should make an appointment with her PCP to discuss further management about her anxiety. Continue Celexa 40 mg daily.

## 2015-12-31 NOTE — Assessment & Plan Note (Signed)
She is a current smoker of 8-10 cigarettes per day she said she has decreased from 1 pack daily. We talked about quitting as that is causing an exacerbation of her COPD. She said she is working on that.

## 2015-12-31 NOTE — Assessment & Plan Note (Signed)
She  presented to the clinic today with complaint of epigastric pain going for about a month, it's mostly burning in character, also complained of some retrosternal  Burning, associated with some mild dyspepsia, but denies any nausea or vomiting. She states that her pain get worse with eating fatty and spicy food. She denies any change in her bowel movements or recent change in her appetite .She has tried Pepcid which improves her symptoms but she never got complete relief with that.  Her pain was more consistent with mild gastritis with some GERD symptoms.  Omeprazole 40 mg daily.

## 2016-01-02 NOTE — Progress Notes (Signed)
Internal Medicine Clinic Attending  I saw and evaluated the patient.  I personally confirmed the key portions of the history and exam documented by Dr. Nelson ChimesAmin and I reviewed pertinent patient test results.  The assessment, diagnosis, and plan were formulated together and I agree with the documentation in the resident's note. We arrange for the patient to get her medications at the outpatient pharmacy since it will be a low cost option. I had significant concerns prescribing the patient Xanax. If her Celexa is not controlling her anxiety, then she needs to have her Celexa changed to an alternative non-benzo medication. Alternatively she could see psychiatry. I am okay prescribing her 5 pills of Xanax with the explicit instruction that this will not be refilled and instead non-benzo options utilized.

## 2016-01-29 ENCOUNTER — Ambulatory Visit: Payer: Self-pay

## 2016-02-04 ENCOUNTER — Encounter (HOSPITAL_COMMUNITY): Payer: Self-pay | Admitting: Family Medicine

## 2016-02-04 ENCOUNTER — Ambulatory Visit (INDEPENDENT_AMBULATORY_CARE_PROVIDER_SITE_OTHER): Payer: Self-pay

## 2016-02-04 ENCOUNTER — Ambulatory Visit (HOSPITAL_COMMUNITY)
Admission: EM | Admit: 2016-02-04 | Discharge: 2016-02-04 | Disposition: A | Discharge status: Home / Self Care | Payer: Self-pay | Attending: Family Medicine | Admitting: Family Medicine

## 2016-02-04 DIAGNOSIS — R0789 Other chest pain: Secondary | ICD-10-CM

## 2016-02-04 MED ORDER — HYDROCODONE-ACETAMINOPHEN 5-325 MG PO TABS: 1.0000 | ORAL_TABLET | Freq: Four times a day (QID) | ORAL | 0 refills | Status: DC | PRN

## 2016-02-04 MED ORDER — KETOROLAC TROMETHAMINE 60 MG/2ML IM SOLN
INTRAMUSCULAR | Status: AC
  Filled 2016-02-04: qty 2

## 2016-02-04 MED ORDER — KETOROLAC TROMETHAMINE 60 MG/2ML IM SOLN
60.0000 mg | Freq: Once | INTRAMUSCULAR | Status: AC
  Administered 2016-02-04: 60 mg via INTRAMUSCULAR

## 2016-02-04 MED ORDER — DICLOFENAC SODIUM 75 MG PO TBEC
75.0000 mg | DELAYED_RELEASE_TABLET | Freq: Two times a day (BID) | ORAL | 0 refills | Status: DC
Start: 2016-02-04 — End: 2017-04-30

## 2016-02-04 MED FILL — HYDROCODON-APAP 5-325: 5-325 | 3 days supply | Qty: 12 | Fill #0

## 2016-02-04 MED FILL — DICLOFENAC SOD 75 MG TAB EC: 75 | 7 days supply | Qty: 14 | Fill #0

## 2016-02-04 NOTE — ED Provider Notes (Signed)
[facility]    CSN: [id number] Arrival date & time: 02/04/16  1003     History   Chief Complaint Chief Complaint  Patient presents with  . Chest Pain    HPI Charma IgoKimberly D Deroy is a 50 y.o. female.   This a 50 year old woman,a smoker, with right rib pain. Her symptoms initially began 1 week ago when she was helping her aunt into bed. The pain seemed to be getting better until yesterday when she was backing out over driveway and looked over shoulder and twisted and felt acute pain with popping sound.  Patient has COPD and is taking inhalers. She's trying to quit smoking.      Past Medical History:  Diagnosis Date  . Acute bronchopneumonia 04/17/2012  . Candida infection, esophageal (HCC) 04/30/2012   Possible candidal esophagitis   . COPD (chronic obstructive pulmonary disease) (HCC) 04/19/2012  . Depression   . Endometriosis   . Headache(784.0)   . Normocytic anemia 01/12/2013  . Strain of right trapezius muscle 03/11/2014    Patient Active Problem List   Diagnosis Date Noted  . Abdominal pain, epigastric 12/31/2015  . Pre-diabetes 03/11/2014  . Strain of right trapezius muscle 03/11/2014  . Spinal stenosis of lumbar region 01/12/2013  . Normocytic anemia 01/12/2013  . Preventative health care 05/17/2012  . Tobacco use 05/17/2012  . Occupational exposure in workplace 05/17/2012  . COPD (chronic obstructive pulmonary disease) (HCC) 04/19/2012  . Depression 04/17/2012    Past Surgical History:  Procedure Laterality Date  . ABDOMINAL HYSTERECTOMY    . CESAREAN SECTION     one previous  . fracture of both feet  2008  . TUBAL LIGATION      OB History    Gravida Para Term Preterm AB Living   3 2 2   1 2    SAB TAB Ectopic Multiple Live Births       1           Home Medications    Prior to Admission medications   Medication Sig Start Date End Date Taking? Authorizing Provider  albuterol (PROVENTIL HFA;VENTOLIN HFA) 108 (90 Base) MCG/ACT inhaler  Inhale 2 puffs into the lungs every 6 (six) hours as needed for wheezing. IM program. 12/31/15  Yes Arnetha CourserSumayya Amin, MD  aspirin EC 81 MG tablet Take 81 mg by mouth daily.     Yes Historical Provider, MD  calcium carbonate (OS-CAL - DOSED IN MG OF ELEMENTAL CALCIUM) 1250 MG tablet Take 1 tablet by mouth daily.     Yes Historical Provider, MD  citalopram (CELEXA) 40 MG tablet Take 1 tablet (40 mg total) by mouth daily. IM program. 12/31/15  Yes Arnetha CourserSumayya Amin, MD  fish oil-omega-3 fatty acids 1000 MG capsule Take 2 g by mouth daily.     Yes Historical Provider, MD  omeprazole (PRILOSEC) 40 MG capsule Take 1 capsule (40 mg total) by mouth daily. IM program 12/31/15 12/30/16 Yes Arnetha CourserSumayya Amin, MD  tiotropium (SPIRIVA HANDIHALER) 18 MCG inhalation capsule Place 1 capsule (18 mcg total) into inhaler and inhale daily. IM program. 12/31/15 12/30/16 Yes Arnetha CourserSumayya Amin, MD  ALPRAZolam (XANAX XR) 0.5 MG 24 hr tablet Take 1 tablet (0.5 mg total) by mouth daily. If needed for anxiety.IM program. 12/31/15   Arnetha CourserSumayya Amin, MD  Cyanocobalamin (VITAMIN B-12 CR PO) Take 1 tablet by mouth daily.    Historical Provider, MD  diclofenac (VOLTAREN) 75 MG EC tablet Take 1 tablet (75 mg total) by mouth 2 (two) times  daily. 02/04/16   Elvina SidleKurt Arnie Clingenpeel, MD  HYDROcodone-acetaminophen (NORCO) 5-325 MG tablet Take 1 tablet by mouth every 6 (six) hours as needed for moderate pain. 02/04/16   Elvina SidleKurt Lavere Shinsky, MD  Multiple Vitamins-Minerals (MULTIVITAMINS THER. W/MINERALS) TABS Take 1 tablet by mouth daily.      Historical Provider, MD  zolpidem (AMBIEN) 5 MG tablet Take 1 tablet (5 mg total) by mouth at bedtime as needed for sleep. 05/02/14   Otis BraceMarjan Rabbani, MD    Family History Family History  Problem Relation Age of Onset  . Diabetes Sister   . Diabetes Brother     Social History Social History  Substance Use Topics  . Smoking status: Current Every Day Smoker    Packs/day: 0.50    Years: 30.00    Types: Cigarettes    Last attempt to  quit: 04/13/2012  . Smokeless tobacco: Former NeurosurgeonUser    Quit date: 04/13/2012     Comment: have electronic cigarette  . Alcohol use 3.6 oz/week    4 Glasses of wine, 2 Cans of beer per week     Comment: Denies current heavy EtOH, says a few drinks per week. Endorses prior heavy use.      Allergies   Review of patient's allergies indicates no known allergies.   Review of Systems Review of Systems  Constitutional: Negative.   HENT: Negative.   Respiratory: Positive for shortness of breath and wheezing.   Cardiovascular: Positive for chest pain.  Gastrointestinal: Negative.   Genitourinary: Negative.   Musculoskeletal: Negative.   Neurological: Negative.      Physical Exam Triage Vital Signs ED Triage Vitals [02/04/16 1032]  Enc Vitals Group     BP 139/90     Pulse Rate 87     Resp 12     Temp 97.9 F (36.6 C)     Temp Source Oral     SpO2 95 %     Weight      Height      Head Circumference      Peak Flow      Pain Score      Pain Loc      Pain Edu?      Excl. in GC?    No data found.   Updated Vital Signs BP 139/90 (BP Location: Left Arm)   Pulse 87   Temp 97.9 F (36.6 C) (Oral)   Resp 12   SpO2 95%    Physical Exam  Constitutional: She is oriented to person, place, and time. She appears well-developed and well-nourished.  HENT:  Head: Normocephalic.  Right Ear: External ear normal.  Left Ear: External ear normal.  Mouth/Throat: Oropharynx is clear and moist.  Eyes: Conjunctivae and EOM are normal. Pupils are equal, round, and reactive to light.  Neck: Normal range of motion. Neck supple.  Cardiovascular: Normal rate, regular rhythm and normal heart sounds.   Pulmonary/Chest: She has wheezes.  Diminished breath sounds bilaterally, symmetric. She has expiratory and history wheezes that are faint bilaterally  Neurological: She is alert and oriented to person, place, and time.  Skin: Skin is warm and dry.  Nursing note and vitals reviewed.  Patient is  splinting her side appears to be acutely uncomfortable with deep breathing.  UC Treatments / Results  Labs (all labs ordered are listed, but only abnormal results are displayed) Labs Reviewed - No data to display  EKG  EKG Interpretation None       Radiology Dg Ribs Unilateral W/chest  Right  Result Date: 02/04/2016 CLINICAL DATA:  Right rib strain.  Pain.  Initial evaluation . EXAM: RIGHT RIBS AND CHEST - 3+ VIEW COMPARISON:  09/20/2012. FINDINGS: Mild right base subsegmental atelectasis and tiny right pleural effusion. No evidence of displaced rib fracture or pneumothorax. Thoracolumbar spine degenerative change. IMPRESSION: Mild right base subsegmental atelectasis with tiny right pleural effusion. No acute bony abnormality. No pneumothorax. Electronically Signed   By: Maisie Fus  Register   On: 02/04/2016 11:01     Procedures Procedures (including critical care time)  Medications Ordered in UC Medications  ketorolac (TORADOL) injection 60 mg (not administered)     Initial Impression / Assessment and Plan / UC Course  I have reviewed the triage vital signs and the nursing notes.  Pertinent labs & imaging results that were available during my care of the patient were reviewed by me and considered in my medical decision making (see chart for details).  Clinical Course    Final Clinical Impressions(s) / UC Diagnoses   Final diagnoses:  Chest wall pain    New Prescriptions New Prescriptions   DICLOFENAC (VOLTAREN) 75 MG EC TABLET    Take 1 tablet (75 mg total) by mouth 2 (two) times daily.   HYDROCODONE-ACETAMINOPHEN (NORCO) 5-325 MG TABLET    Take 1 tablet by mouth every 6 (six) hours as needed for moderate pain.  Toradol 60 mg IM given   Elvina Sidle, MD 02/04/16 1125

## 2016-02-04 NOTE — Discharge Instructions (Signed)
If pain continues to need to follow-up with your doctor in internal medicine.  No fractures are seen on the x-ray. This indicates that you had a strained muscle in the chest wall which should heal over the next couple days.

## 2016-02-04 NOTE — ED Triage Notes (Signed)
[facility] with Rib Pain, in right side.

## 2016-02-15 ENCOUNTER — Ambulatory Visit (INDEPENDENT_AMBULATORY_CARE_PROVIDER_SITE_OTHER): Payer: Self-pay | Admitting: Internal Medicine

## 2016-02-15 VITALS — BP 130/86 | HR 78 | Temp 98.3°F | Wt 166.9 lb

## 2016-02-15 DIAGNOSIS — T148XXA Other injury of unspecified body region, initial encounter: Secondary | ICD-10-CM

## 2016-02-15 DIAGNOSIS — S39012A Strain of muscle, fascia and tendon of lower back, initial encounter: Secondary | ICD-10-CM

## 2016-02-15 DIAGNOSIS — X500XXA Overexertion from strenuous movement or load, initial encounter: Secondary | ICD-10-CM

## 2016-02-15 DIAGNOSIS — X58XXXA Exposure to other specified factors, initial encounter: Secondary | ICD-10-CM

## 2016-02-15 DIAGNOSIS — R0781 Pleurodynia: Secondary | ICD-10-CM

## 2016-02-15 MED ORDER — KETOROLAC TROMETHAMINE 60 MG/2ML IM SOLN
60.0000 mg | Freq: Once | INTRAMUSCULAR | Status: AC
  Administered 2016-02-15: 60 mg via INTRAMUSCULAR

## 2016-02-15 MED ORDER — DICLOFENAC POTASSIUM 50 MG PO TABS: 50.0000 mg | ORAL_TABLET | Freq: Three times a day (TID) | ORAL | 0 refills | Status: DC | PRN

## 2016-02-15 MED ORDER — CYCLOBENZAPRINE HCL 5 MG PO TABS: 5.0000 mg | ORAL_TABLET | Freq: Two times a day (BID) | ORAL | 0 refills | Status: DC | PRN

## 2016-02-15 MED FILL — CYCLOBENZAPRINE 5 MG TABLET: 5 | 15 days supply | Qty: 30 | Fill #0

## 2016-02-15 MED FILL — DICLOFENAC POT 50 MG TABLET: 50 | 20 days supply | Qty: 60 | Fill #0

## 2016-02-15 NOTE — Patient Instructions (Signed)
Ms. Patricia Farrell,  It was a pleasure meeting you today. Please take Flexeril 2 times daily as needed for your muscle strain Please take diclofenac 2-3 times daily as needed for your muscle pain Please continue to use hot pads and do not do heavy lifting for the next 2 weeks to allow for healing If pain continues or worsens please call the clinic

## 2016-02-15 NOTE — Progress Notes (Signed)
   CC: Right sided rib pain  HPI:  [name] is a 50 y.o. woman with history noted below presents to the internal medicine clinic for right-sided rib pain. One month ago she stated that she was helping her stepmom into bed and felt like she pulled a muscle on the right side. She states that the pain has progressively gotten worse and visited the ED on 10/30. She had an x-ray that did not show any rib fracture. Today patient states that she reached into the refrigerator and felt a pop in her right side and has associated pain with the event. She states that the pain is worse with movement and radiates to the back. She was given a short course of diclofenac and Norco in the ED with some benefit. She denies any fever, chills, falls or weakness in her arms or legs.  Past Medical History:  Diagnosis Date  . Acute bronchopneumonia 04/17/2012  . Candida infection, esophageal (HCC) 04/30/2012   Possible candidal esophagitis   . COPD (chronic obstructive pulmonary disease) (HCC) 04/19/2012  . Depression   . Endometriosis   . Headache(784.0)   . Normocytic anemia 01/12/2013  . Strain of right trapezius muscle 03/11/2014    Review of Systems:  Noted in HPI  Physical Exam:  Vitals:   02/15/16 1500  BP: 130/86  Pulse: 78  Temp: 98.3 F (36.8 C)  TempSrc: Oral  SpO2: 95%  Weight: 166 lb 14.4 oz (75.7 kg)   Physical Exam  Constitutional:  Tearful on exam  Cardiovascular: Normal rate, regular rhythm and normal heart sounds.  Exam reveals no gallop and no friction rub.   No murmur heard. Pulmonary/Chest: Effort normal and breath sounds normal. No respiratory distress. She has no wheezes. She has no rales.  Musculoskeletal:  Tenderness to palpation along the 7,8,9 ribs on the right side that radiated to the back  Neurological:  5 out of 5 motor strength in lower extremities bilaterally Sensation to touch bilaterally in lower extremities    Assessment & Plan:   See encounters tab  for problem based medical decision making.   Patient seen with Dr. Oswaldo DoneVincent

## 2016-02-17 DIAGNOSIS — T148XXA Other injury of unspecified body region, initial encounter: Secondary | ICD-10-CM | POA: Insufficient documentation

## 2016-02-17 NOTE — Assessment & Plan Note (Signed)
Assessment: Right sided muscular strain 1 month ago patient developed right sided pain when helping her step mom into bed.  She has had continued pain with sudden movements, most recently when reaching into the refrigerator. It appears that the patient had a muscle strain that has not healed properly.  We will continue the diclofenac and add flexeril and do an in office toradol injection.  I advised rest for the next 2 weeks without heavy lifting to allow for healing.  I told her if symptoms got worse to call the clinic.      Plan -Diclofenac 50mg  TID PRN - Flexeril 5mg  BID - Toradol IM in office

## 2016-02-19 NOTE — Progress Notes (Signed)
Internal Medicine Clinic Attending  I saw and evaluated the patient.  I personally confirmed the key portions of the history and exam documented by Dr. Hoffman and I reviewed pertinent patient test results.  The assessment, diagnosis, and plan were formulated together and I agree with the documentation in the resident's note.      

## 2016-02-20 ENCOUNTER — Emergency Department (HOSPITAL_COMMUNITY): Payer: Self-pay

## 2016-02-20 ENCOUNTER — Inpatient Hospital Stay (HOSPITAL_COMMUNITY)
Admission: EM | Admit: 2016-02-20 | Discharge: 2016-02-21 | DRG: 192 | Disposition: A | Discharge status: Home / Self Care | Payer: Self-pay | Attending: Student in an Organized Health Care Education/Training Program | Admitting: Student in an Organized Health Care Education/Training Program

## 2016-02-20 ENCOUNTER — Encounter (HOSPITAL_COMMUNITY): Payer: Self-pay | Admitting: Emergency Medicine

## 2016-02-20 DIAGNOSIS — Z825 Family history of asthma and other chronic lower respiratory diseases: Secondary | ICD-10-CM

## 2016-02-20 DIAGNOSIS — J441 Chronic obstructive pulmonary disease with (acute) exacerbation: Principal | ICD-10-CM | POA: Diagnosis present

## 2016-02-20 DIAGNOSIS — R0902 Hypoxemia: Secondary | ICD-10-CM | POA: Diagnosis present

## 2016-02-20 DIAGNOSIS — K219 Gastro-esophageal reflux disease without esophagitis: Secondary | ICD-10-CM | POA: Diagnosis present

## 2016-02-20 DIAGNOSIS — F17210 Nicotine dependence, cigarettes, uncomplicated: Secondary | ICD-10-CM | POA: Diagnosis present

## 2016-02-20 DIAGNOSIS — R0789 Other chest pain: Secondary | ICD-10-CM

## 2016-02-20 DIAGNOSIS — F329 Major depressive disorder, single episode, unspecified: Secondary | ICD-10-CM | POA: Diagnosis present

## 2016-02-20 DIAGNOSIS — J398 Other specified diseases of upper respiratory tract: Secondary | ICD-10-CM | POA: Diagnosis present

## 2016-02-20 DIAGNOSIS — F419 Anxiety disorder, unspecified: Secondary | ICD-10-CM | POA: Diagnosis present

## 2016-02-20 DIAGNOSIS — F418 Other specified anxiety disorders: Secondary | ICD-10-CM

## 2016-02-20 DIAGNOSIS — Z8249 Family history of ischemic heart disease and other diseases of the circulatory system: Secondary | ICD-10-CM

## 2016-02-20 DIAGNOSIS — Z7982 Long term (current) use of aspirin: Secondary | ICD-10-CM

## 2016-02-20 DIAGNOSIS — Z8701 Personal history of pneumonia (recurrent): Secondary | ICD-10-CM

## 2016-02-20 DIAGNOSIS — Z833 Family history of diabetes mellitus: Secondary | ICD-10-CM

## 2016-02-20 DIAGNOSIS — Z79899 Other long term (current) drug therapy: Secondary | ICD-10-CM

## 2016-02-20 HISTORY — DX: Gastro-esophageal reflux disease without esophagitis: K21.9

## 2016-02-20 LAB — COMPREHENSIVE METABOLIC PANEL
ALBUMIN: 3.6 g/dL (ref 3.5–5.0)
ALK PHOS: 67 U/L (ref 38–126)
ALT: 25 U/L (ref 14–54)
AST: 26 U/L (ref 15–41)
Anion gap: 10 (ref 5–15)
BILIRUBIN TOTAL: 0.4 mg/dL (ref 0.3–1.2)
BUN: 17 mg/dL (ref 6–20)
CALCIUM: 9.2 mg/dL (ref 8.9–10.3)
CO2: 24 mmol/L (ref 22–32)
Chloride: 105 mmol/L (ref 101–111)
Creatinine, Ser: 0.74 mg/dL (ref 0.44–1.00)
GFR calc Af Amer: >60 mL/min (ref >60)
GLUCOSE: 154 mg/dL — AB (ref 65–99)
POTASSIUM: 3.8 mmol/L (ref 3.5–5.1)
Sodium: 139 mmol/L (ref 135–145)
TOTAL PROTEIN: 6.6 g/dL (ref 6.5–8.1)

## 2016-02-20 LAB — CBC WITH DIFFERENTIAL/PLATELET
BASOS ABS: 0 10*3/uL (ref 0.0–0.1)
Basophils Relative: 0 %
Eosinophils Absolute: 0.8 10*3/uL — ABNORMAL HIGH (ref 0.0–0.7)
Eosinophils Relative: 6 %
HEMATOCRIT: 43.4 % (ref 36.0–46.0)
HEMOGLOBIN: 14.3 g/dL (ref 12.0–15.0)
LYMPHS PCT: 15 %
Lymphs Abs: 2.3 10*3/uL (ref 0.7–4.0)
MCH: 30.8 pg (ref 26.0–34.0)
MCHC: 32.9 g/dL (ref 30.0–36.0)
MCV: 93.5 fL (ref 78.0–100.0)
MONO ABS: 0.4 10*3/uL (ref 0.1–1.0)
MONOS PCT: 3 %
NEUTROS ABS: 11.3 10*3/uL — AB (ref 1.7–7.7)
NEUTROS PCT: 76 %
Platelets: 224 10*3/uL (ref 150–400)
RBC: 4.64 MIL/uL (ref 3.87–5.11)
RDW: 13.7 % (ref 11.5–15.5)
WBC: 14.9 10*3/uL — ABNORMAL HIGH (ref 4.0–10.5)

## 2016-02-20 LAB — I-STAT TROPONIN, ED: Troponin i, poc: 0 ng/mL (ref 0.00–0.08)

## 2016-02-20 LAB — BRAIN NATRIURETIC PEPTIDE: B NATRIURETIC PEPTIDE 5: 394.9 pg/mL — AB (ref 0.0–100.0)

## 2016-02-20 MED ORDER — DEXTROSE 5 % IV SOLN
500.0000 mg | Freq: Once | INTRAVENOUS | Status: AC
  Administered 2016-02-20: 500 mg via INTRAVENOUS
  Filled 2016-02-20: qty 500

## 2016-02-20 MED ORDER — ALBUTEROL SULFATE (2.5 MG/3ML) 0.083% IN NEBU: 2.5000 mg | INHALATION_SOLUTION | RESPIRATORY_TRACT | Status: DC | PRN

## 2016-02-20 MED ORDER — HYDROCODONE-ACETAMINOPHEN 5-325 MG PO TABS
2.0000 | ORAL_TABLET | Freq: Once | ORAL | Status: AC
  Administered 2016-02-20: 2 via ORAL
  Filled 2016-02-20: qty 2

## 2016-02-20 MED ORDER — MAGNESIUM SULFATE 2 GM/50ML IV SOLN
2.0000 g | Freq: Once | INTRAVENOUS | Status: AC
  Administered 2016-02-20: 2 g via INTRAVENOUS
  Filled 2016-02-20: qty 50

## 2016-02-20 MED ORDER — PREDNISONE 20 MG PO TABS
40.0000 mg | ORAL_TABLET | Freq: Every day | ORAL | Status: DC
  Administered 2016-02-21: 40 mg via ORAL
  Filled 2016-02-20: qty 2

## 2016-02-20 MED ORDER — LIDOCAINE 5 % EX PTCH
1.0000 | MEDICATED_PATCH | CUTANEOUS | Status: DC
  Administered 2016-02-20 – 2016-02-21 (×2): 1 via TRANSDERMAL
  Filled 2016-02-20 (×2): qty 1

## 2016-02-20 MED ORDER — TIOTROPIUM BROMIDE MONOHYDRATE 18 MCG IN CAPS
18.0000 ug | ORAL_CAPSULE | Freq: Every day | RESPIRATORY_TRACT | Status: DC
  Administered 2016-02-21: 18 ug via RESPIRATORY_TRACT
  Filled 2016-02-20: qty 5

## 2016-02-20 MED ORDER — ALPRAZOLAM 0.5 MG PO TABS: 0.5000 mg | ORAL_TABLET | Freq: Every day | ORAL | Status: DC | PRN

## 2016-02-20 MED ORDER — POLYETHYLENE GLYCOL 3350 17 G PO PACK: 17.0000 g | PACK | Freq: Every day | ORAL | Status: DC | PRN

## 2016-02-20 MED ORDER — NAPROXEN 250 MG PO TABS: 500.0000 mg | ORAL_TABLET | Freq: Two times a day (BID) | ORAL | Status: DC | PRN

## 2016-02-20 MED ORDER — IOPAMIDOL (ISOVUE-370) INJECTION 76%
INTRAVENOUS | Status: AC
  Administered 2016-02-20: 100 mL
  Filled 2016-02-20: qty 100

## 2016-02-20 MED ORDER — ENOXAPARIN SODIUM 40 MG/0.4ML ~~LOC~~ SOLN
40.0000 mg | SUBCUTANEOUS | Status: DC
  Administered 2016-02-20: 40 mg via SUBCUTANEOUS
  Filled 2016-02-20: qty 0.4

## 2016-02-20 MED ORDER — GUAIFENESIN ER 600 MG PO TB12
1200.0000 mg | ORAL_TABLET | Freq: Two times a day (BID) | ORAL | Status: DC
  Administered 2016-02-20 – 2016-02-21 (×2): 1200 mg via ORAL
  Filled 2016-02-20 (×2): qty 2

## 2016-02-20 MED ORDER — PANTOPRAZOLE SODIUM 40 MG PO TBEC
40.0000 mg | DELAYED_RELEASE_TABLET | Freq: Every day | ORAL | Status: DC
  Administered 2016-02-20 – 2016-02-21 (×2): 40 mg via ORAL
  Filled 2016-02-20 (×2): qty 1

## 2016-02-20 MED ORDER — IPRATROPIUM-ALBUTEROL 0.5-2.5 (3) MG/3ML IN SOLN
3.0000 mL | Freq: Three times a day (TID) | RESPIRATORY_TRACT | Status: DC
  Administered 2016-02-21 (×2): 3 mL via RESPIRATORY_TRACT
  Filled 2016-02-20 (×2): qty 3

## 2016-02-20 MED ORDER — IPRATROPIUM-ALBUTEROL 0.5-2.5 (3) MG/3ML IN SOLN
3.0000 mL | RESPIRATORY_TRACT | Status: AC
  Administered 2016-02-20 (×3): 3 mL via RESPIRATORY_TRACT
  Filled 2016-02-20: qty 6
  Filled 2016-02-20: qty 3

## 2016-02-20 MED ORDER — ACETAMINOPHEN 650 MG RE SUPP: 650.0000 mg | Freq: Four times a day (QID) | RECTAL | Status: DC | PRN

## 2016-02-20 MED ORDER — CYCLOBENZAPRINE HCL 5 MG PO TABS: 5.0000 mg | ORAL_TABLET | Freq: Two times a day (BID) | ORAL | Status: DC | PRN

## 2016-02-20 MED ORDER — AZITHROMYCIN 500 MG PO TABS
250.0000 mg | ORAL_TABLET | Freq: Every day | ORAL | Status: DC
  Administered 2016-02-21: 250 mg via ORAL
  Filled 2016-02-20: qty 1

## 2016-02-20 MED ORDER — ASPIRIN EC 81 MG PO TBEC
81.0000 mg | DELAYED_RELEASE_TABLET | Freq: Every day | ORAL | Status: DC
  Administered 2016-02-20 – 2016-02-21 (×2): 81 mg via ORAL
  Filled 2016-02-20 (×2): qty 1

## 2016-02-20 MED ORDER — CITALOPRAM HYDROBROMIDE 40 MG PO TABS
40.0000 mg | ORAL_TABLET | Freq: Every day | ORAL | Status: DC
  Administered 2016-02-20 – 2016-02-21 (×2): 40 mg via ORAL
  Filled 2016-02-20 (×2): qty 1

## 2016-02-20 MED ORDER — ACETAMINOPHEN 325 MG PO TABS
650.0000 mg | ORAL_TABLET | Freq: Four times a day (QID) | ORAL | Status: DC | PRN
  Administered 2016-02-20 – 2016-02-21 (×2): 650 mg via ORAL
  Filled 2016-02-20 (×2): qty 2

## 2016-02-20 MED ORDER — IPRATROPIUM-ALBUTEROL 0.5-2.5 (3) MG/3ML IN SOLN
3.0000 mL | Freq: Four times a day (QID) | RESPIRATORY_TRACT | Status: DC
  Administered 2016-02-20 (×2): 3 mL via RESPIRATORY_TRACT
  Filled 2016-02-20 (×2): qty 3

## 2016-02-20 NOTE — H&P (Signed)
Date: 02/20/2016               Patient Name:  Patricia Farrell MRN: [id number]  DOB: 1965/08/01 Age / Sex: 50 y.o., female   PCP: No Pcp Per Patient         Medical Service: Internal Medicine Teaching Service         Attending Physician: Dr. Alvira MondayErin Schlossman, MD    First Contact: Dr. Nelson ChimesAmin Pager: [phone]  Second Contact: Dr. Dimple Caseyice Pager: [phone]       After Hours (After 5p/  First Contact Pager: [phone]  weekends / holidays): Second Contact Pager: [phone]   Chief Complaint: Shortness of the breath And right-sided chest pain.  History of Present Illness: Patricia Farrell, 50 y.o lady,With past medical history of COPD and depression came with worsening right chest pain and shortness of breath for last 2 days. Patient states that she start hurting her right rib cage while helping her stepmother about 5 weeks ago, [facility] on chest x-ray found to have some muscle strain and was given diclofenac and Flexeril, with some initial benefit. Her pain got worse 2 days ago while taking care of her 6963-month-old grandson, it was associated with shortness of breath, mild nausea without any vomitus, to the point that it become unbearable this morning so she called the ambulance. She was found to be hypoxic. She described her pain as sharp right-sided chest pain below her right breast going across to the back, aggravated with deep breathing and moving around. She has an history of chronic cough for the last 2 month accompanied with yellow to greenish sputum she denies any recent change in her cough or sputum production,Denies any hemoptysis, fever, nasal congestion. She denies any orthopnea or PND. She sleeps on 4 pillows because of her GERD symptoms. Patient also complains of mild diarrhea with one loose stool per day for last 3 days. She denies any urinary symptoms, recent change in her appetite or weight.  Meds:  Current Meds  Medication Sig  . albuterol (PROVENTIL  HFA;VENTOLIN HFA) 108 (90 Base) MCG/ACT inhaler Inhale 2 puffs into the lungs every 6 (six) hours as needed for wheezing. IM program.  . ALPRAZolam (XANAX XR) 0.5 MG 24 hr tablet Take 1 tablet (0.5 mg total) by mouth daily. If needed for anxiety.IM program.  . aspirin EC 81 MG tablet Take 81 mg by mouth daily.    . calcium carbonate (OS-CAL - DOSED IN MG OF ELEMENTAL CALCIUM) 1250 MG tablet Take 1 tablet by mouth daily.    . citalopram (CELEXA) 40 MG tablet Take 1 tablet (40 mg total) by mouth daily. IM program.  . Cyanocobalamin (VITAMIN B-12 CR PO) Take 1 tablet by mouth daily.  . cyclobenzaprine (FLEXERIL) 5 MG tablet Take 1 tablet (5 mg total) by mouth 2 (two) times daily as needed for muscle spasms.  . diclofenac (CATAFLAM) 50 MG tablet Take 1 tablet (50 mg total) by mouth 3 (three) times daily as needed.  . fish oil-omega-3 fatty acids 1000 MG capsule Take 2 g by mouth daily.    . Multiple Vitamins-Minerals (MULTIVITAMINS THER. W/MINERALS) TABS Take 1 tablet by mouth daily.    Marland Kitchen. omeprazole (PRILOSEC) 40 MG capsule Take 1 capsule (40 mg total) by mouth daily. IM program  . tiotropium (SPIRIVA HANDIHALER) 18 MCG inhalation capsule Place 1 capsule (18 mcg total) into inhaler and inhale daily. IM program.     Allergies: Allergies  as of 02/20/2016  . (No Known Allergies)   Past Medical History:  Diagnosis Date  . Acute bronchopneumonia 04/17/2012  . Candida infection, esophageal (HCC) 04/30/2012   Possible candidal esophagitis   . COPD (chronic obstructive pulmonary disease) (HCC) 04/19/2012  . Depression   . Endometriosis   . Headache(784.0)   . Normocytic anemia 01/12/2013  . Strain of right trapezius muscle 03/11/2014    Family History: Grandmother-hypertension One brother-hypertension Sister and another brother -diabetes   grandfather-emphysema  Social History: She is a nonsmoker , recently trying to quit , states that her smoking is dropped to 10 cigarettes per day, it was 1  pack per day before. Drinks about 1 bottle of wine in 2-3 beers per week. Denies any illicit drug use.  Review of Systems: A complete ROS was negative except as per HPI.   Physical Exam: Blood pressure 105/74, pulse 110, temperature 98.1 F (36.7 C), temperature source Oral, resp. rate 18, height 5\' 3"  (1.6 m), weight 166 lb (75.3 kg), SpO2 95 %.  Vitals:   02/20/16 1500 02/20/16 1515 02/20/16 1604 02/20/16 1630  BP: 103/63 96/61 120/70   Pulse: 106 105 (!) 101   Resp: 13 10 20    Temp:   97.6 F (36.4 C)   TempSrc:   Oral   SpO2: 94% 96% 98% 96%  Weight:      Height:       General: Vital signs reviewed.  Patient is well-developed and well-nourished, in no acute distress and cooperative with exam.  Head: Normocephalic and atraumatic. Eyes: EOMI, conjunctivae normal, no scleral icterus.  Neck: Supple, trachea midline, normal ROM, no JVD, masses, thyromegaly, or carotid bruit present.  Cardiovascular: RRR, S1 normal, S2 normal, no murmurs, gallops, or rubs. Pulmonary/Chest: Tenderness along right rib cage base. High chest with decreased breath sounds and scattered inspiratory wheeze. Abdominal: Soft, mild epigastric tenderness, non-distended, BS +, no masses, organomegaly, or guarding present.  Musculoskeletal: No joint deformities, erythema, or stiffness, ROM full and nontender. Extremities: No lower extremity edema bilaterally,  pulses symmetric and intact bilaterally. No cyanosis or clubbing. Neurological: A&O x3, Strength is normal and symmetric bilaterally, cranial nerve II-XII are grossly intact, no focal motor deficit, sensory intact to light touch bilaterally.  Skin: Warm, dry and intact. No rashes or erythema. Psychiatric: Normal mood and affect. speech and behavior is normal. Cognition and memory are normal.   Labs. CBC    Component Value Date/Time   WBC 14.9 (H) 02/20/2016 1000   RBC 4.64 02/20/2016 1000   HGB 14.3 02/20/2016 1000   HCT 43.4 02/20/2016 1000   PLT  224 02/20/2016 1000   MCV 93.5 02/20/2016 1000   MCH 30.8 02/20/2016 1000   MCHC 32.9 02/20/2016 1000   RDW 13.7 02/20/2016 1000   LYMPHSABS 2.3 02/20/2016 1000   MONOABS 0.4 02/20/2016 1000   EOSABS 0.8 (H) 02/20/2016 1000   BASOSABS 0.0 02/20/2016 1000   CMP     Component Value Date/Time   NA 139 02/20/2016 1000   K 3.8 02/20/2016 1000   CL 105 02/20/2016 1000   CO2 24 02/20/2016 1000   GLUCOSE 154 (H) 02/20/2016 1000   BUN 17 02/20/2016 1000   CREATININE 0.74 02/20/2016 1000   CREATININE 0.79 03/10/2014 1649   CALCIUM 9.2 02/20/2016 1000   PROT 6.6 02/20/2016 1000   ALBUMIN 3.6 02/20/2016 1000   AST 26 02/20/2016 1000   ALT 25 02/20/2016 1000   ALKPHOS 67 02/20/2016 1000   BILITOT 0.4  02/20/2016 1000   GFRNONAA >60 02/20/2016 1000   GFRNONAA 88 03/10/2014 1649   GFRAA >60 02/20/2016 1000   GFRAA >89 03/10/2014 1649   BNP. 394.9 Trop. 0.00  EKG: Sinus tachycardia with borderline QTC prolongation.  CXR: FINDINGS: Midline trachea. Normal heart size and mediastinal contours. No pleural effusion or pneumothorax. Clear lungs.  IMPRESSION: No acute cardiopulmonary disease.  CTA chest. FINDINGS: Cardiovascular: Satisfactory opacification of the pulmonary arteries to the segmental level. No evidence of pulmonary embolism. Normal heart size. No pericardial effusion.  Mediastinum/Nodes: No enlarged mediastinal, hilar, or axillary lymph nodes. Thyroid gland, trachea, and esophagus demonstrate no significant findings.  Lungs/Pleura: Peripheral blebs in the upper lobes. Diffuse peribronchial thickening. Tracheomalacia including the right mainstem bronchus. Slight atelectasis at the right lung base posteriorly. No effusions.  Upper Abdomen: Sludge in the gallbladder.  Otherwise normal.  Musculoskeletal: No chest wall abnormality. No acute or significant osseous findings.  Review of the MIP images confirms the above findings.  IMPRESSION: 1. No pulmonary  emboli. 2. Extensive bilateral peribronchial thickening consistent with bronchitis. 3. Emphysema. 4. Tracheomalacia.  Assessment & Plan by Problem:   Ms. Laduke, 50 y.o lady,With past medical history of COPD and depression came with worsening right chest pain and shortness of breath for last 2 days.  -Differentiated with right-sided chest pain and dyspnea include, COPD exacerbation, CHF exacerbation, pulmonary embolism, community-acquired pneumonia or musculoskeletal pain.  COPD exacerbation. She has an history of COPD. Having increased in cough with sputum production for the last 2 months. He denies any recent change and fever. She was tachypneic and hypoxic on arrival to the ED. Responded well to Solu-Medrol and albuterol nebulizer and to return home oxygen. She started desaturating to 88% when oxygen was stopped. She was having tight chest, with decreased breath sounds and few scattered inspiratory wheezes. Her CT chest was consistent with bronchitis. -Albuterol nebulizer, every 2 hourly when necessary. -Spiriva -Z Pack. -Mucinex  CHF exacerbation. She has no clinical sign of volume overload. Her chest x-ray and CT chest was negative for any pulmonary congestion. Her BNP is 394. CHF exacerbation is less likely at this point.  Pulmonary embolism. She was hypoxic, tachypneic with right-sided chest pain  and shortness of breath. CTA was negative for any pulmonary embolism.  CAP. She has no evidence of pneumonia on chest x-ray or CT chest.  Musculoskeletal chest pain. Her chest pain is musculoskeletal in character. It's reproducible. -Tylenol, Flexeril and heating pad.  Depression/anxiety. Continue her home dose of Celexa and Xanax When needed.  Code. Full Diet. Regular DVT prophylaxis. Lovenox   When needed Inpatient with expected length of stay greater than 2 midnights.  Signed: Arnetha Courser, MD 02/20/2016, 1:18 PM  Pager: [phone]

## 2016-02-20 NOTE — ED Notes (Signed)
Attempted to call report

## 2016-02-20 NOTE — ED Notes (Signed)
Pt in CT.

## 2016-02-20 NOTE — ED Notes (Signed)
In X ray

## 2016-02-20 NOTE — ED Triage Notes (Signed)
Per EMS, patient has been having SOB onsets for past five weeks but since last night, SOB has gotten worse.  Wheezing in all four lung quadrants.  10 Albuterol, 1 atrovent, 125 solumedrol, 2g mag, and 200 fentanyl given en route.  Since given meds, able to speak and complete sentences.  20 IV left hand.  Hx of COPD.  All recent xrays do not show infection.  112/73, CBG 82, 99% on NEB,80 HR,.

## 2016-02-20 NOTE — ED Provider Notes (Signed)
[facility]-EMERGENCY DEPT Provider Note   CSN: [id number] Arrival date & time: 02/20/16  0909     History   Chief Complaint Chief Complaint  Patient presents with  . Shortness of Breath    HPI Patricia Farrell is a 50 y.o. female.  HPI    49 year old female with a history of COPD, depression presents with concern for shortness of breath. Patient reports pleuritic right sided chest wall pain over the last 5 weeks, which was previously diagnosed as a muscle strain, however has had progressive shortness of breath over this period of time and worsening last night.  Patient reports some chronic cough, with worsening of cough over the last month, productive of yellow-green sputum. Describes the pain in the right side of her chest as severe, sharp, worse with deep breaths. States the medications she's been taking for muscle strain have not relieved the pain. Pain 10/10. No fevers, no congestion.  Past Medical History:  Diagnosis Date  . Acute bronchopneumonia 04/17/2012  . Candida infection, esophageal (HCC) 04/30/2012   Possible candidal esophagitis   . COPD (chronic obstructive pulmonary disease) (HCC) 04/19/2012  . Depression   . Endometriosis   . Headache(784.0)   . Normocytic anemia 01/12/2013  . Strain of right trapezius muscle 03/11/2014    Patient Active Problem List   Diagnosis Date Noted  . COPD exacerbation (HCC) 02/20/2016  . Muscle strain 02/17/2016  . Abdominal pain, epigastric 12/31/2015  . Pre-diabetes 03/11/2014  . Strain of right trapezius muscle 03/11/2014  . Spinal stenosis of lumbar region 01/12/2013  . Normocytic anemia 01/12/2013  . Preventative health care 05/17/2012  . Tobacco use 05/17/2012  . Occupational exposure in workplace 05/17/2012  . COPD (chronic obstructive pulmonary disease) (HCC) 04/19/2012  . Depression 04/17/2012    Past Surgical History:  Procedure Laterality Date  . ABDOMINAL HYSTERECTOMY    . CESAREAN SECTION     one previous  .  fracture of both feet  2008  . TUBAL LIGATION      OB History    Gravida Para Term Preterm AB Living   3 2 2   1 2    SAB TAB Ectopic Multiple Live Births       1           Home Medications    Prior to Admission medications   Medication Sig Start Date End Date Taking? Authorizing Provider  albuterol (PROVENTIL HFA;VENTOLIN HFA) 108 (90 Base) MCG/ACT inhaler Inhale 2 puffs into the lungs every 6 (six) hours as needed for wheezing. IM program. 12/31/15  Yes Arnetha Courser, MD  ALPRAZolam (XANAX XR) 0.5 MG 24 hr tablet Take 1 tablet (0.5 mg total) by mouth daily. If needed for anxiety.IM program. 12/31/15  Yes Arnetha Courser, MD  aspirin EC 81 MG tablet Take 81 mg by mouth daily.     Yes Historical Provider, MD  calcium carbonate (OS-CAL - DOSED IN MG OF ELEMENTAL CALCIUM) 1250 MG tablet Take 1 tablet by mouth daily.     Yes Historical Provider, MD  citalopram (CELEXA) 40 MG tablet Take 1 tablet (40 mg total) by mouth daily. IM program. 12/31/15  Yes Arnetha Courser, MD  Cyanocobalamin (VITAMIN B-12 CR PO) Take 1 tablet by mouth daily.   Yes Historical Provider, MD  cyclobenzaprine (FLEXERIL) 5 MG tablet Take 1 tablet (5 mg total) by mouth 2 (two) times daily as needed for muscle spasms. 02/15/16  Yes Jessica Ratliff Hoffman, DO  diclofenac (CATAFLAM) 50 MG tablet Take 1  tablet (50 mg total) by mouth 3 (three) times daily as needed. 02/15/16  Yes Jessica Ratliff Hoffman, DO  fish oil-omega-3 fatty acids 1000 MG capsule Take 2 g by mouth daily.     Yes Historical Provider, MD  Multiple Vitamins-Minerals (MULTIVITAMINS THER. W/MINERALS) TABS Take 1 tablet by mouth daily.     Yes Historical Provider, MD  omeprazole (PRILOSEC) 40 MG capsule Take 1 capsule (40 mg total) by mouth daily. IM program 12/31/15 12/30/16 Yes Arnetha Courser, MD  tiotropium (SPIRIVA HANDIHALER) 18 MCG inhalation capsule Place 1 capsule (18 mcg total) into inhaler and inhale daily. IM program. 12/31/15 12/30/16 Yes Arnetha Courser, MD    diclofenac (VOLTAREN) 75 MG EC tablet Take 1 tablet (75 mg total) by mouth 2 (two) times daily. Patient not taking: Reported on 02/20/2016 02/04/16   Elvina Sidle, MD  HYDROcodone-acetaminophen [facility]) 5-325 MG tablet Take 1 tablet by mouth every 6 (six) hours as needed for moderate pain. Patient not taking: Reported on 02/20/2016 02/04/16   Elvina Sidle, MD  zolpidem (AMBIEN) 5 MG tablet Take 1 tablet (5 mg total) by mouth at bedtime as needed for sleep. Patient not taking: Reported on 02/20/2016 05/02/14   Otis Brace, MD    Family History Family History  Problem Relation Age of Onset  . Diabetes Sister   . Diabetes Brother     Social History Social History  Substance Use Topics  . Smoking status: Current Every Day Smoker    Packs/day: 0.50    Years: 30.00    Types: Cigarettes    Last attempt to quit: 04/13/2012  . Smokeless tobacco: Former Neurosurgeon    Quit date: 04/13/2012     Comment: have electronic cigarette  . Alcohol use 3.6 oz/week    4 Glasses of wine, 2 Cans of beer per week     Comment: Denies current heavy EtOH, says a few drinks per week. Endorses prior heavy use.      Allergies   Patient has no known allergies.   Review of Systems Review of Systems  Constitutional: Negative for fever.  HENT: Negative for congestion and sore throat.   Eyes: Negative for visual disturbance.  Respiratory: Positive for cough, shortness of breath and wheezing.   Cardiovascular: Positive for chest pain (right sided). Negative for leg swelling.  Gastrointestinal: Negative for abdominal pain, nausea and vomiting.  Genitourinary: Negative for difficulty urinating.  Musculoskeletal: Negative for back pain and neck pain.  Skin: Negative for rash.  Neurological: Negative for syncope and headaches.     Physical Exam Updated Vital Signs BP 120/70 (BP Location: Right Arm)   Pulse (!) 101   Temp 97.6 F (36.4 C) (Oral)   Resp 20   Ht 5\' 3"  (1.6 m)   Wt 166 lb (75.3 kg)    SpO2 96%   BMI 29.41 kg/m   Physical Exam  Constitutional: She is oriented to person, place, and time. She appears well-developed and well-nourished. No distress.  HENT:  Head: Normocephalic and atraumatic.  Eyes: Conjunctivae and EOM are normal.  Neck: Normal range of motion.  Cardiovascular: Normal rate, regular rhythm, normal heart sounds and intact distal pulses.  Exam reveals no gallop and no friction rub.   No murmur heard. Pulmonary/Chest: Effort normal. Tachypnea noted. No respiratory distress. She has wheezes. She has no rales.  Abdominal: Soft. She exhibits no distension. There is no tenderness. There is no guarding.  Musculoskeletal: She exhibits no edema or tenderness.  Neurological: She is alert and  oriented to person, place, and time.  Skin: Skin is warm and dry. No rash noted. She is not diaphoretic. No erythema.  Nursing note and vitals reviewed.    ED Treatments / Results  Labs (all labs ordered are listed, but only abnormal results are displayed) Labs Reviewed  CBC WITH DIFFERENTIAL/PLATELET - Abnormal; Notable for the following:       Result Value   WBC 14.9 (*)    Neutro Abs 11.3 (*)    Eosinophils Absolute 0.8 (*)    All other components within normal limits  COMPREHENSIVE METABOLIC PANEL - Abnormal; Notable for the following:    Glucose, Bld 154 (*)    All other components within normal limits  BRAIN NATRIURETIC PEPTIDE - Abnormal; Notable for the following:    B Natriuretic Peptide 394.9 (*)    All other components within normal limits  BASIC METABOLIC PANEL  CBC  I-STAT TROPOININ, ED    EKG  EKG Interpretation  Date/Time:  Wednesday February 20 2016 13:16:12 EST Ventricular Rate:  110 PR Interval:    QRS Duration: 85 QT Interval:  369 QTC Calculation: 500 R Axis:   84 Text Interpretation:  Sinus tachycardia Borderline T abnormalities, anterior leads Borderline prolonged QT interval Baseline wander in lead(s) V1 No significant change since  last tracing Confirmed by [facility] Hospital[facility] MD, Sherri Mcarthy ([id number]) on 02/20/2016 1:34:57 PM       Radiology Dg Chest 2 View  Result Date: 02/20/2016 CLINICAL DATA:  Right-sided chest pain for 5 weeks. Short of breath yesterday. Productive cough for 2 months. Smoker. EXAM: CHEST  2 VIEW COMPARISON:  02/04/2016 FINDINGS: Midline trachea. Normal heart size and mediastinal contours. No pleural effusion or pneumothorax. Clear lungs. IMPRESSION: No acute cardiopulmonary disease. Electronically Signed   By: Jeronimo GreavesKyle  Talbot M.D.   On: 02/20/2016 10:51   Ct Angio Chest Pe W And/or Wo Contrast  Result Date: 02/20/2016 CLINICAL DATA:  Right-sided chest pain and shortness of breath. Productive cough. EXAM: CT ANGIOGRAPHY CHEST WITH CONTRAST TECHNIQUE: Multidetector CT imaging of the chest was performed using the standard protocol during bolus administration of intravenous contrast. Multiplanar CT image reconstructions and MIPs were obtained to evaluate the vascular anatomy. CONTRAST:  73 cc Isovue 370 COMPARISON:  Chest x-ray dated 02/20/2016 FINDINGS: Cardiovascular: Satisfactory opacification of the pulmonary arteries to the segmental level. No evidence of pulmonary embolism. Normal heart size. No pericardial effusion. Mediastinum/Nodes: No enlarged mediastinal, hilar, or axillary lymph nodes. Thyroid gland, trachea, and esophagus demonstrate no significant findings. Lungs/Pleura: Peripheral blebs in the upper lobes. Diffuse peribronchial thickening. Tracheomalacia including the right mainstem bronchus. Slight atelectasis at the right lung base posteriorly. No effusions. Upper Abdomen: Sludge in the gallbladder.  Otherwise normal. Musculoskeletal: No chest wall abnormality. No acute or significant osseous findings. Review of the MIP images confirms the above findings. IMPRESSION: 1. No pulmonary emboli. 2. Extensive bilateral peribronchial thickening consistent with bronchitis. 3. Emphysema. 4. Tracheomalacia. Electronically  Signed   By: Francene BoyersJames  Maxwell M.D.   On: 02/20/2016 13:12    Procedures Procedures (including critical care time)  Medications Ordered in ED Medications  cyclobenzaprine (FLEXERIL) tablet 5 mg (not administered)  pantoprazole (PROTONIX) EC tablet 40 mg (40 mg Oral Given 02/20/16 1927)  ALPRAZolam (XANAX) tablet 0.5 mg (not administered)  citalopram (CELEXA) tablet 40 mg (40 mg Oral Given 02/20/16 1927)  tiotropium (SPIRIVA) inhalation capsule 18 mcg (18 mcg Inhalation Not Given 02/20/16 1615)  aspirin EC tablet 81 mg (81 mg Oral Given 02/20/16 1927)  lidocaine (  LIDODERM) 5 % 1 patch (1 patch Transdermal Patch Applied 02/20/16 1928)  naproxen (NAPROSYN) tablet 500 mg (not administered)  enoxaparin (LOVENOX) injection 40 mg (40 mg Subcutaneous Given 02/20/16 1927)  acetaminophen (TYLENOL) tablet 650 mg (650 mg Oral Given 02/20/16 1927)    Or  acetaminophen (TYLENOL) suppository 650 mg ( Rectal See Alternative 02/20/16 1927)  polyethylene glycol (MIRALAX / GLYCOLAX) packet 17 g (not administered)  ipratropium-albuterol (DUONEB) 0.5-2.5 (3) MG/3ML nebulizer solution 3 mL (3 mLs Nebulization Given 02/20/16 1627)  predniSONE (DELTASONE) tablet 40 mg (not administered)  guaiFENesin (MUCINEX) 12 hr tablet 1,200 mg (not administered)  azithromycin (ZITHROMAX) tablet 250 mg (not administered)  albuterol (PROVENTIL) (2.5 MG/3ML) 0.083% nebulizer solution 2.5 mg (not administered)  magnesium sulfate IVPB 2 g 50 mL (0 g Intravenous Stopped 02/20/16 1213)  ipratropium-albuterol (DUONEB) 0.5-2.5 (3) MG/3ML nebulizer solution 3 mL (3 mLs Nebulization Given 02/20/16 1105)  iopamidol (ISOVUE-370) 76 % injection (100 mLs  Contrast Given 02/20/16 1246)  HYDROcodone-acetaminophen (NORCO/VICODIN) 5-325 MG per tablet 2 tablet (2 tablets Oral Given 02/20/16 1315)  azithromycin (ZITHROMAX) 500 mg in dextrose 5 % 250 mL IVPB (0 mg Intravenous Stopped 02/20/16 1507)     Initial Impression / Assessment and Plan  / ED Course  I have reviewed the triage vital signs and the nursing notes.  Pertinent labs & imaging results that were available during my care of the patient were reviewed by me and considered in my medical decision making (see chart for details).  Clinical Course     50 year old female with a history of COPD, depression presents with concern for shortness of breath. Patient reports pleuritic right sided chest wall pain over the last 5 weeks, which was previously diagnosed as a muscle strain, however has had progressive shortness of breath over this period of time and worsening last night. Differential diagnosis for dyspnea includes ACS, PE, COPD exacerbation, CHF exacerbation, anemia, pneumonia.  Chest x-ray was done which showed no acute findings. EKG was evaluated by me which showed no acute changes.  BNP was not significantly elevated and there are no clear signs of CHF on exam.  Troponin negative. Patient hypoxic down to 83% on room air after receiving breathing treatments, and given pleuritic pain, hypoxia a CT PE study was ordered.  EMS gave solumedrol, duonebs, and duonebs x3 given in ED with improvement in wheezing.  Patient admitted to Internal Medicine for further care. CT PE study negative.  Given azithromycin for COPD exacerbation.   Final Clinical Impressions(s) / ED Diagnoses   Final diagnoses:  COPD exacerbation [facility](HCC)  Hypoxia    New Prescriptions Current Discharge Medication List       Alvira MondayErin Alpha Mysliwiec, MD 02/20/16 1929

## 2016-02-20 NOTE — Progress Notes (Addendum)
Pt had some hard coughing spells after her breathing treatment, and vomited her food. Pt was not nauseous before or after the emesis episode. Pt reported feeling better after the episode. Pt reported she does not need any antiemetic medication. Pt was advised to call if she starts having any other symptoms. MD was notified. No new orders. Will continue to monitor.

## 2016-02-21 DIAGNOSIS — F17200 Nicotine dependence, unspecified, uncomplicated: Secondary | ICD-10-CM

## 2016-02-21 DIAGNOSIS — J441 Chronic obstructive pulmonary disease with (acute) exacerbation: Principal | ICD-10-CM

## 2016-02-21 DIAGNOSIS — Z9981 Dependence on supplemental oxygen: Secondary | ICD-10-CM

## 2016-02-21 DIAGNOSIS — J9621 Acute and chronic respiratory failure with hypoxia: Secondary | ICD-10-CM

## 2016-02-21 DIAGNOSIS — Z7951 Long term (current) use of inhaled steroids: Secondary | ICD-10-CM

## 2016-02-21 LAB — BASIC METABOLIC PANEL
ANION GAP: 9 (ref 5–15)
BUN: 16 mg/dL (ref 6–20)
CHLORIDE: 104 mmol/L (ref 101–111)
CO2: 26 mmol/L (ref 22–32)
Calcium: 9.2 mg/dL (ref 8.9–10.3)
Creatinine, Ser: 0.64 mg/dL (ref 0.44–1.00)
GFR calc Af Amer: >60 mL/min (ref >60)
GLUCOSE: 112 mg/dL — AB (ref 65–99)
POTASSIUM: 4.9 mmol/L (ref 3.5–5.1)
Sodium: 139 mmol/L (ref 135–145)

## 2016-02-21 LAB — CBC
HEMATOCRIT: 41.1 % (ref 36.0–46.0)
HEMOGLOBIN: 13.5 g/dL (ref 12.0–15.0)
MCH: 31.1 pg (ref 26.0–34.0)
MCHC: 32.8 g/dL (ref 30.0–36.0)
MCV: 94.7 fL (ref 78.0–100.0)
Platelets: 257 10*3/uL (ref 150–400)
RBC: 4.34 MIL/uL (ref 3.87–5.11)
RDW: 14 % (ref 11.5–15.5)
WBC: 21.1 10*3/uL — ABNORMAL HIGH (ref 4.0–10.5)

## 2016-02-21 MED ORDER — BENZONATATE 100 MG PO CAPS: 100.0000 mg | ORAL_CAPSULE | Freq: Three times a day (TID) | ORAL | 0 refills | Status: DC | PRN

## 2016-02-21 MED ORDER — PREDNISONE 20 MG PO TABS: 40.0000 mg | ORAL_TABLET | Freq: Every day | ORAL | 0 refills | Status: DC

## 2016-02-21 MED ORDER — AZITHROMYCIN 250 MG PO TABS: 250.0000 mg | ORAL_TABLET | Freq: Every day | ORAL | 0 refills | Status: DC

## 2016-02-21 MED ORDER — TIOTROPIUM BROMIDE MONOHYDRATE 18 MCG IN CAPS: 18.0000 ug | ORAL_CAPSULE | Freq: Every day | RESPIRATORY_TRACT | 2 refills | Status: DC

## 2016-02-21 MED ORDER — GUAIFENESIN ER 600 MG PO TB12: 1200.0000 mg | ORAL_TABLET | Freq: Two times a day (BID) | ORAL | 0 refills | Status: DC

## 2016-02-21 MED ORDER — OMEPRAZOLE 40 MG PO CPDR: 40.0000 mg | DELAYED_RELEASE_CAPSULE | Freq: Every day | ORAL | 1 refills | Status: DC

## 2016-02-21 MED FILL — OMEPRAZOLE DR 40 MG CAPSULE: 40 | 30 days supply | Qty: 30 | Fill #0

## 2016-02-21 MED FILL — predniSONE 20 MG TABS: 20 | 4 days supply | Qty: 8 | Fill #0

## 2016-02-21 MED FILL — AZITHROMYCIN 250 MG TABLET: 250 | 4 days supply | Qty: 4 | Fill #0

## 2016-02-21 MED FILL — SPIRIVA 18 MCG CP-HANDIHALE: 18 | 30 days supply | Qty: 30 | Fill #0

## 2016-02-21 MED FILL — BENZONATATE 100 MG CAPSULE: 100 | 7 days supply | Qty: 20 | Fill #0

## 2016-02-21 NOTE — Progress Notes (Signed)
PharmD candidates from internal medicine teaching service discussed with patient smoking cessation and counseled on the use of nicotine replacement options.   Discussed motivation for quitting, current habits and barriers to cessation, new ideas for tapering cigarette consumption, and appropriate use of nicotine gum. Counseled patient on proper chewing strategy and discussed new strategies to avert cravings.  Patricia RaddleHaley Farrell and Patricia GiovanniMeredith Obadiah Farrell PharmD Candidates

## 2016-02-21 NOTE — Progress Notes (Signed)
   Subjective: Currently, the patient is feeling much improved. Still feeling mildly SOB with sitting up and ambulation on 2L. Cough improved. Agreeable to ambulation today and possible DC w/ or w/o home O2 as needed.  Objective: Vital signs in last 24 hours: Vitals:   02/21/16 0515 02/21/16 0742 02/21/16 0824 02/21/16 0827  BP: 127/80 135/77    Pulse: 92 77    Resp: 18 20    Temp: 97.8 F (36.6 C) 97.6 F (36.4 C)    TempSrc: Oral Oral    SpO2: 93% 97% (!) 86% 96%  Weight:      Height:       Physical Exam: Physical Exam  Constitutional: She appears well-developed. She is cooperative. No distress.  Cardiovascular: Normal rate, regular rhythm, normal heart sounds, intact distal pulses and normal pulses.   Pulmonary/Chest: Effort normal. No respiratory distress. She has wheezes (mild exp wheezes in bil bases). She has no rhonchi. She has no rales. Breasts are symmetrical.  Abdominal: Soft. Bowel sounds are normal. There is no tenderness.  Musculoskeletal: She exhibits no edema.   Labs: CBC:  Recent Labs Lab 02/20/16 1000 02/21/16 0548  WBC 14.9* 21.1*  NEUTROABS 11.3*  --   HGB 14.3 13.5  HCT 43.4 41.1  MCV 93.5 94.7  PLT 224 257   Metabolic Panel:  Recent Labs Lab 02/20/16 1000 02/21/16 0548  NA 139 139  K 3.8 4.9  CL 105 104  CO2 24 26  GLUCOSE 154* 112*  BUN 17 16  CREATININE 0.74 0.64  CALCIUM 9.2 9.2  ALT 25  --   ALKPHOS 67  --   BILITOT 0.4  --   PROT 6.6  --   ALBUMIN 3.6  --     Medications: Scheduled Medications: . aspirin EC  81 mg Oral Daily  . azithromycin  250 mg Oral Daily  . citalopram  40 mg Oral Daily  . enoxaparin (LOVENOX) injection  40 mg Subcutaneous Q24H  . guaiFENesin  1,200 mg Oral BID  . ipratropium-albuterol  3 mL Nebulization TID  . lidocaine  1 patch Transdermal Q24H  . pantoprazole  40 mg Oral Daily  . predniSONE  40 mg Oral Q breakfast  . tiotropium  18 mcg Inhalation Daily   PRN Medications: acetaminophen **OR**  acetaminophen, albuterol, ALPRAZolam, cyclobenzaprine, naproxen, polyethylene glycol  Assessment/Plan: Pt is a 50 y.o. yo female with a PMHx of COPD, tobacco abuse and depression who was admitted on 02/20/2016 with symptoms of SOB and productive cough, which was determined to be secondary to COPD exacerbation.  COPD exacerbation. Improving w/ prednisone, nebs and axithromycin. No O2 at home, but still requiring 2L ON. Will plan to ambulate with pulse ox and DC today with home O2 if sats below 88%. Continue working on tobacco cessation. Counseled today. - Duo-nebulizer q2h PRN - home Spiriva - finish Z Pack on DC - finish 5 day course of prednisone 40mg  - Mucinex PRN  Length of Stay: 1 day(s) Dispo: Anticipated discharge today after ambulatory pulse-ox.  Carolynn CommentBryan Mahogany Torrance, MD Pager: [phone] (7AM-5PM) 02/21/2016, 11:46 AM

## 2016-02-21 NOTE — Progress Notes (Addendum)
Patient asking for cough medicine. MD paged and stated they would write an RX for tessalon and mucinex. Patient stated she also needs refills on her inhaler, nebulizer, nausea and heartburn medication. md paged again.

## 2016-02-21 NOTE — Progress Notes (Signed)
Charma IgoKimberly D Wilson to be D/C'd Home per MD order.  Discussed with the patient and all questions fully answered.  VSS, Skin clean, dry and intact without evidence of skin break down, no evidence of skin tears noted. IV catheter discontinued intact. Site without signs and symptoms of complications. Dressing and pressure applied.  An After Visit Summary was printed and given to the patient. Patient received prescription.  D/c education completed with patient/family including follow up instructions, medication list, d/c activities limitations if indicated, with other d/c instructions as indicated by MD - patient able to verbalize understanding, all questions fully answered.   Patient instructed to return to ED, call 911, or call MD for any changes in condition.   Patient to be escorted via WC, and D/C home via private auto when home oxygen delivered to room.   L'ESPERANCE, Silver Achey C 02/21/2016 3:24 PM

## 2016-02-21 NOTE — Progress Notes (Signed)
SATURATION QUALIFICATIONS: (This note is used to comply with regulatory documentation for home oxygen)  Patient Saturations on Room Air at Rest = 95%  Patient Saturations on Room Air while Ambulating = 87%  Patient Saturations on 2 Liters of oxygen while Ambulating = 91%  Please briefly explain why patient needs home oxygen: desaturation while ambulating

## 2016-02-21 NOTE — Discharge Summary (Signed)
Name: Patricia Farrell MRN: [id number] DOB: 02-01-1966 50 y.o. PCP: No Pcp Per Patient  Date of Admission: 02/20/2016  9:09 AM Date of Discharge: 02/21/2016 Attending Physician: Tyson Aliasuncan Thomas Vincent, MD  Discharge Diagnosis: Active Problems:   COPD exacerbation [facility](HCC)  Discharge Medications:   Medication List    TAKE these medications   albuterol 108 (90 Base) MCG/ACT inhaler Commonly known as:  PROVENTIL HFA;VENTOLIN HFA Inhale 2 puffs into the lungs every 6 (six) hours as needed for wheezing. IM program.   ALPRAZolam 0.5 MG 24 hr tablet Commonly known as:  XANAX XR Take 1 tablet (0.5 mg total) by mouth daily. If needed for anxiety.IM program.   aspirin EC 81 MG tablet Take 81 mg by mouth daily.   azithromycin 250 MG tablet Commonly known as:  ZITHROMAX Take 1 tablet (250 mg total) by mouth daily. Start taking on:  02/22/2016   benzonatate 100 MG capsule Commonly known as:  TESSALON PERLES Take 1 capsule (100 mg total) by mouth 3 (three) times daily as needed for cough.   calcium carbonate 1250 (500 Ca) MG tablet Commonly known as:  OS-CAL - dosed in mg of elemental calcium Take 1 tablet by mouth daily.   citalopram 40 MG tablet Commonly known as:  CELEXA Take 1 tablet (40 mg total) by mouth daily. IM program.   cyclobenzaprine 5 MG tablet Commonly known as:  FLEXERIL Take 1 tablet (5 mg total) by mouth 2 (two) times daily as needed for muscle spasms.   diclofenac 50 MG tablet Commonly known as:  CATAFLAM Take 1 tablet (50 mg total) by mouth 3 (three) times daily as needed.   diclofenac 75 MG EC tablet Commonly known as:  VOLTAREN Take 1 tablet (75 mg total) by mouth 2 (two) times daily.   fish oil-omega-3 fatty acids 1000 MG capsule Take 2 g by mouth daily.   guaiFENesin 600 MG 12 hr tablet Commonly known as:  MUCINEX Take 2 tablets (1,200 mg total) by mouth 2 (two) times daily.   HYDROcodone-acetaminophen 5-325 MG tablet Commonly known as:   NORCO Take 1 tablet by mouth every 6 (six) hours as needed for moderate pain.   multivitamins ther. w/minerals Tabs tablet Take 1 tablet by mouth daily.   omeprazole 40 MG capsule Commonly known as:  PRILOSEC Take 1 capsule (40 mg total) by mouth daily. IM program   predniSONE 20 MG tablet Commonly known as:  DELTASONE Take 2 tablets (40 mg total) by mouth daily with breakfast. Start taking on:  02/22/2016   tiotropium 18 MCG inhalation capsule Commonly known as:  SPIRIVA HANDIHALER Place 1 capsule (18 mcg total) into inhaler and inhale daily. IM program.   VITAMIN B-12 CR PO Take 1 tablet by mouth daily.   zolpidem 5 MG tablet Commonly known as:  AMBIEN Take 1 tablet (5 mg total) by mouth at bedtime as needed for sleep.            Durable Medical Equipment        Start     Ordered   02/21/16 1317  For home use only DME oxygen  Once    Question Answer Comment  Mode or (Route) Nasal cannula   Liters per Minute 2   Frequency Continuous (stationary and portable oxygen unit needed)   Oxygen delivery system Gas      02/21/16 1317      Disposition and follow-up:   [name].  At the hospital follow up visit please address:  1.  COPD: compliance with medications, improvement of SOB and breathing, tobacco cessation efforts  Follow-up Appointments: Follow-up Information    [facility]. Go on 03/03/2016.   Why:  f/u at 2:45pm Contact information: [address] [place] [address] [phone]          Hospital Course by problem list: Active Problems:   COPD exacerbation (HCC)   1. Patient presented with SOB and symptoms suggestive of exacerbation of her chronic obstructive pulmonary disease. She underwent CXR and chest CT which were negative for PE and focal PNA. She had a mildly elevated BNP at 394 without clinical correlation ov hypervolemia.  She was treated with nebulizers, steroids, and azithromycin and was much improved overnight. She was still requiring 2L O2 with exertion (87% O2 sats on RA with ambulation) but had 95-96% sats at rest on RA. She was discharged with 2L home O2 for use with exertion. She was also discharged with 4 days of prednisone to complete her 5 day course and 4 days of azithromycin to complete her 5 day course. She was also given mucinex and tessalon pearls for symptomatic treatment of cough and congestion. She will f[facility]u with the [facility].  Discharge Vitals:   BP 135/77 (BP Location: Right Arm)   Pulse 77   Temp 97.6 F (36.4 C) (Oral)   Resp 20   Ht 5\' 3"  (1.6 m)   Wt 75.3 kg (166 lb)   SpO2 96%   BMI 29.41 kg/m   Pertinent Labs, Studies, and Procedures: As above.  Procedures Performed:  Dg Chest 2 View  Result Date: 02/20/2016 CLINICAL DATA:  Right-sided chest pain for 5 weeks. Short of breath yesterday. Productive cough for 2 months. Smoker. EXAM: CHEST  2 VIEW COMPARISON:  02/04/2016 FINDINGS: Midline trachea. Normal heart size and mediastinal contours. No pleural effusion or pneumothorax. Clear lungs. IMPRESSION: No acute cardiopulmonary disease. Electronically Signed   By: Jeronimo Greaves M.D.   On: 02/20/2016 10:51   Dg Ribs Unilateral W/chest Right  Result Date: 02/04/2016 CLINICAL DATA:  Right rib strain.  Pain.  Initial evaluation . EXAM: RIGHT RIBS AND CHEST - 3+ VIEW COMPARISON:  09/20/2012. FINDINGS: Mild right base subsegmental atelectasis and tiny right pleural effusion. No evidence of displaced rib fracture or pneumothorax. Thoracolumbar spine degenerative change. IMPRESSION: Mild right base subsegmental atelectasis with tiny right pleural effusion. No acute bony abnormality. No pneumothorax. Electronically Signed   By: Maisie Fus  Register   On: 02/04/2016 11:01   Ct Angio Chest Pe W And/or Wo Contrast  Result Date: 02/20/2016 CLINICAL DATA:  Right-sided chest pain and shortness of breath.  Productive cough. EXAM: CT ANGIOGRAPHY CHEST WITH CONTRAST TECHNIQUE: Multidetector CT imaging of the chest was performed using the standard protocol during bolus administration of intravenous contrast. Multiplanar CT image reconstructions and MIPs were obtained to evaluate the vascular anatomy. CONTRAST:  73 cc Isovue 370 COMPARISON:  Chest x-ray dated 02/20/2016 FINDINGS: Cardiovascular: Satisfactory opacification of the pulmonary arteries to the segmental level. No evidence of pulmonary embolism. Normal heart size. No pericardial effusion. Mediastinum/Nodes: No enlarged mediastinal, hilar, or axillary lymph nodes. Thyroid gland, trachea, and esophagus demonstrate no significant findings. Lungs/Pleura: Peripheral blebs in the upper lobes. Diffuse peribronchial thickening. Tracheomalacia including the right mainstem bronchus. Slight atelectasis at the right lung base posteriorly. No effusions. Upper Abdomen: Sludge in the gallbladder.  Otherwise normal. Musculoskeletal: No chest wall abnormality.  No acute or significant osseous findings. Review of the MIP images confirms the above findings. IMPRESSION: 1. No pulmonary emboli. 2. Extensive bilateral peribronchial thickening consistent with bronchitis. 3. Emphysema. 4. Tracheomalacia. Electronically Signed   By: Francene BoyersJames  Maxwell M.D.   On: 02/20/2016 13:12   Discharge Instructions: Discharge Instructions    Call MD for:  difficulty breathing, headache or visual disturbances    Complete by:  As directed    Call MD for:  persistant dizziness or light-headedness    Complete by:  As directed    Call MD for:  temperature >100.4    Complete by:  As directed    Diet - low sodium heart healthy    Complete by:  As directed    Discharge instructions    Complete by:  As directed    You had an exacerbation of your COPD. You have responded well to the steroids and antibiotics we have given you in the hospital. You should continue to take both the prednisone and the  azithromycin for another 4 days in order to complete your recovery.  Because your lungs have some damage from smoking, your oxygen saturation levels may take a little longer to return to normal following your illness. Your oxygen saturation was at 87% when you were active in the hospital and we have ordered a home oxygen tank for you to use while being active at home. Please use 2L of oxygen with exertion until your SOB resolves. You do not need to use oxygen at rest as your oxygen levels have been normal here in the hospital.  It was a pleasure to meet you and I hope to see you again down in the [facility]!  Thanks, Dr. Marinda ElkStrelow   Increase activity slowly    Complete by:  As directed       Signed: Carolynn CommentBryan Helder Crisafulli, MD 02/21/2016, 1:37 PM   Pager: [phone]

## 2016-02-21 NOTE — Care Management Note (Addendum)
Case Management Note  Patient Details  Name: Patricia Farrell MRN: [id number] Date of Birth: Jul 03, 1965  Subjective/Objective:                 Spoke to patient at the bedside. She states that she is independent from home with her fiance, watches her 517 month old grandson daily. She states that she has a nebulizer, goes to IM clinic and gets med[facility] through them [facility] for $4. She states that she does not have a problem paying for these meds but will need refills at DC and will ask MD for them. Patient states that she was told she may go home today, she is pending walking O2 sats. CM notified Kayla RN of stat ambulatory sat order at 12:00.    Action/Plan:  DC to home self care, [facility].     Expected Discharge Date:                  Expected Discharge Plan:  Home/Self Care  In-House Referral:  NA  Discharge planning Services  CM Consult  Post Acute Care Choice:    Choice offered to:     DME Arranged:    DME Agency:     HH Arranged:    HH Agency:     Status of Service:  In process, will continue to follow  If discussed at Long Length of Stay Meetings, dates discussed:    Additional Comments:  Lawerance SabalDebbie Terressa Evola, RN 02/21/2016, 12:31 PM

## 2016-03-03 ENCOUNTER — Ambulatory Visit: Payer: Self-pay

## 2016-03-03 ENCOUNTER — Other Ambulatory Visit: Payer: Self-pay | Admitting: Internal Medicine

## 2016-03-03 MED FILL — PROVENTIL HFA 90 MCG INH: 108 (90 BAS | 25 days supply | Qty: 7 | Fill #1

## 2016-03-03 MED FILL — CITALOPRAM HBR 40 MG TABLET: 40 | 30 days supply | Qty: 30 | Fill #1

## 2016-03-04 ENCOUNTER — Encounter: Payer: Self-pay | Admitting: Internal Medicine

## 2016-03-04 ENCOUNTER — Ambulatory Visit (INDEPENDENT_AMBULATORY_CARE_PROVIDER_SITE_OTHER): Payer: Self-pay | Admitting: Internal Medicine

## 2016-03-04 VITALS — BP 146/89 | HR 95 | Temp 97.4°F | Ht 63.0 in | Wt 173.4 lb

## 2016-03-04 DIAGNOSIS — F17210 Nicotine dependence, cigarettes, uncomplicated: Secondary | ICD-10-CM

## 2016-03-04 DIAGNOSIS — Z720 Tobacco use: Secondary | ICD-10-CM

## 2016-03-04 DIAGNOSIS — Z7951 Long term (current) use of inhaled steroids: Secondary | ICD-10-CM

## 2016-03-04 DIAGNOSIS — J449 Chronic obstructive pulmonary disease, unspecified: Secondary | ICD-10-CM

## 2016-03-04 MED ORDER — BUDESONIDE-FORMOTEROL FUMARATE 160-4.5 MCG/ACT IN AERO: 2.0000 | INHALATION_SPRAY | Freq: Two times a day (BID) | RESPIRATORY_TRACT | 12 refills | Status: DC

## 2016-03-04 MED ORDER — NICOTINE 14 MG/24HR TD PT24: 14.0000 mg | MEDICATED_PATCH | Freq: Every day | TRANSDERMAL | 0 refills | Status: DC

## 2016-03-04 MED FILL — SYMBICORT 160-4.5 MCG INH: 160-4.5 | 30 days supply | Qty: 10 | Fill #0

## 2016-03-04 NOTE — Progress Notes (Signed)
   CC: COPD  HPI:  [name] is a 50 y.o. woman with PMHx as noted below who presents today for follow up of her COPD.  She was hospitalized from 11/15-11/16 after presenting with increased dyspnea, acute on chronic respiratory failure, and right-sided rib pain.   She reports she is doing much better since hospital discharge. She reports improvement in her SOB and only a mild cough that is relieved with Mucinex. She completed both Azithromycin and Prednisone courses. She has been using her Spiriva inhaler daily. She did not start a Symbicort inhaler as this was not prescribed on discharge. She reports only having to use supplemental oxygen one day shortly after discharge when she was doing a lot of walking. She notes she is still having some right-sided rib cage pain but it has improved.  She is still smoking, but has cut down from 0.5-1 packs per day to 1 pack per week. She is interested in smoking cessation and is aware of her triggers, including drinking alcohol and being around other people that smoke.   Past Medical History:  Diagnosis Date  . Acute bronchopneumonia 04/17/2012  . Candida infection, esophageal (HCC) 04/30/2012   Possible candidal esophagitis   . COPD (chronic obstructive pulmonary disease) (HCC) 04/19/2012  . Depression   . Endometriosis   . GERD (gastroesophageal reflux disease)   . Normocytic anemia 01/12/2013  . Strain of right trapezius muscle 03/11/2014    Review of Systems:  All negative except per HPI  Physical Exam:  Vitals:   03/04/16 1109  BP: (!) 146/89  Pulse: 95  Temp: 97.4 F (36.3 C)  TempSrc: Oral  SpO2: 98%  Weight: 173 lb 6.4 oz (78.7 kg)  Height: 5\' 3"  (1.6 m)   Pulse oximetry at rest: 95% Pulse oximetry while ambulating: 91%  General: alert, sitting up, NAD HEENT: [place]/AT, EOMI, sclera anicteric, pharynx non-erythematous, mucus membranes moist CV: RRR, no m/g/r Pulm: CTA bilaterally, breaths non-labored on room air, no wheezing  or rhonchi Neuro: alert and oriented x 3  Assessment & Plan:   See Encounters Tab for problem based charting.  Patient discussed with Dr. Cleda DaubE. Hoffman

## 2016-03-04 NOTE — Assessment & Plan Note (Signed)
Patient very interested in smoking cessation. She has already significantly cut down on her use since hospital discharge. Encouraged her to keep up the good work. We discussed smoking cessation aides and she is interested in nicotine patches and nicotine gum. I gave her information on the [facility] Quitline and also prescribed her nicotine patches. I recommended she pick a quit date and then start using the patches at that point. Advised to not use patches while smoking cigarettes.

## 2016-03-04 NOTE — Assessment & Plan Note (Signed)
Recently admitted for COPD exacerbation. She is doing much better since completing her Azithro and Prednisone courses. I started her on Symbicort today as I think this was supposed to be started on discharge but was not prescribed. We extensively discussed how each of her inhalers should be used and that Symbicort and Spiriva should be used daily. Advised her that she can return the home oxygen as her oxygen saturation is not dropping significantly at rest and ambulation. We also extensively discussed the benefits of smoking cessation and I encouraged her to keep cutting down on her cigarette use.

## 2016-03-04 NOTE — Telephone Encounter (Signed)
Patient advised to use ibuprofen instead

## 2016-03-04 NOTE — Patient Instructions (Addendum)
General Instructions: - Start Symbicort inhaler 2 puffs twice daily. This inhaler should be used every day even if you feel good. This is also true for your Spiriva inhaler. - Only use Albuterol (yellow inhaler) as needed for shortness of breath or wheezing - Start using Nicotine patches when you are ready to quit completely. You should not use these while smoking cigarettes  Please bring your medicines with you each time you come to clinic.  Medicines may include prescription medications, over-the-counter medications, herbal remedies, eye drops, vitamins, or other pills.   Progress Toward Treatment Goals:  Treatment Goal 01/12/2013  Stop smoking smoking less    Self Care Goals & Plans:  Self Care Goal 01/12/2013  Manage my medications take my medicines as prescribed; bring my medications to every visit; refill my medications on time; follow the sick day instructions if I am sick  Eat healthy foods eat more vegetables; eat fruit for snacks and desserts; eat baked foods instead of fried foods  Be physically active take a walk every day; find an activity I enjoy  Stop smoking -    No flowsheet data found.   Care Management & Community Referrals:  Referral 01/12/2013  Referrals made for care management support none needed

## 2016-03-06 ENCOUNTER — Other Ambulatory Visit: Payer: Self-pay | Admitting: Internal Medicine

## 2016-03-09 NOTE — Progress Notes (Signed)
Internal Medicine Clinic Attending  Case discussed with Dr. Rivet at the time of the visit.  We reviewed the resident's history and exam and pertinent patient test results.  I agree with the assessment, diagnosis, and plan of care documented in the resident's note.  

## 2016-04-11 ENCOUNTER — Other Ambulatory Visit: Payer: Self-pay | Admitting: Internal Medicine

## 2016-04-11 MED FILL — OMEPRAZOLE DR 40 MG CAPSULE: 40 | 30 days supply | Qty: 30 | Fill #1

## 2016-04-16 MED FILL — SPIRIVA 18 MCG CP-HANDIHALE: 18 | 30 days supply | Qty: 30 | Fill #1

## 2016-06-17 MED FILL — CITALOPRAM HBR 40 MG TABLET: 40 | 30 days supply | Qty: 30 | Fill #2

## 2016-07-14 MED FILL — CITALOPRAM HBR 40 MG TABLET: 40 | 30 days supply | Qty: 30 | Fill #3

## 2016-07-14 MED FILL — CYCLOBENZAPRINE 5 MG TABLET: 5 | 15 days supply | Qty: 30 | Fill #0

## 2016-07-29 ENCOUNTER — Emergency Department (HOSPITAL_COMMUNITY): Payer: Self-pay

## 2016-07-29 ENCOUNTER — Ambulatory Visit (HOSPITAL_COMMUNITY)
Admission: EM | Admit: 2016-07-29 | Discharge: 2016-07-29 | Disposition: A | Discharge status: Other Acute Inpatient Hospital | Payer: Self-pay

## 2016-07-29 ENCOUNTER — Emergency Department (HOSPITAL_COMMUNITY)
Admission: EM | Admit: 2016-07-29 | Discharge: 2016-07-29 | Disposition: A | Discharge status: Home / Self Care | Payer: Self-pay | Attending: Emergency Medicine | Admitting: Emergency Medicine

## 2016-07-29 ENCOUNTER — Telehealth: Payer: Self-pay | Admitting: Internal Medicine

## 2016-07-29 ENCOUNTER — Encounter (HOSPITAL_COMMUNITY): Payer: Self-pay | Admitting: Emergency Medicine

## 2016-07-29 DIAGNOSIS — Z79899 Other long term (current) drug therapy: Secondary | ICD-10-CM | POA: Insufficient documentation

## 2016-07-29 DIAGNOSIS — M79602 Pain in left arm: Secondary | ICD-10-CM | POA: Insufficient documentation

## 2016-07-29 DIAGNOSIS — Z7982 Long term (current) use of aspirin: Secondary | ICD-10-CM | POA: Insufficient documentation

## 2016-07-29 DIAGNOSIS — F17210 Nicotine dependence, cigarettes, uncomplicated: Secondary | ICD-10-CM | POA: Insufficient documentation

## 2016-07-29 DIAGNOSIS — J449 Chronic obstructive pulmonary disease, unspecified: Secondary | ICD-10-CM | POA: Insufficient documentation

## 2016-07-29 DIAGNOSIS — Z5321 Procedure and treatment not carried out due to patient leaving prior to being seen by health care provider: Secondary | ICD-10-CM | POA: Insufficient documentation

## 2016-07-29 LAB — BASIC METABOLIC PANEL
Anion gap: 10 (ref 5–15)
BUN: 12 mg/dL (ref 6–20)
CHLORIDE: 104 mmol/L (ref 101–111)
CO2: 23 mmol/L (ref 22–32)
Calcium: 8.7 mg/dL — ABNORMAL LOW (ref 8.9–10.3)
Creatinine, Ser: 0.73 mg/dL (ref 0.44–1.00)
GFR calc non Af Amer: >60 mL/min (ref >60)
Glucose, Bld: 84 mg/dL (ref 65–99)
POTASSIUM: 4.2 mmol/L (ref 3.5–5.1)
SODIUM: 137 mmol/L (ref 135–145)

## 2016-07-29 LAB — CBC
HEMATOCRIT: 41.5 % (ref 36.0–46.0)
Hemoglobin: 13.8 g/dL (ref 12.0–15.0)
MCH: 30.1 pg (ref 26.0–34.0)
MCHC: 33.3 g/dL (ref 30.0–36.0)
MCV: 90.4 fL (ref 78.0–100.0)
Platelets: 203 10*3/uL (ref 150–400)
RBC: 4.59 MIL/uL (ref 3.87–5.11)
RDW: 13.8 % (ref 11.5–15.5)
WBC: 10.2 10*3/uL (ref 4.0–10.5)

## 2016-07-29 LAB — I-STAT TROPONIN, ED: Troponin i, poc: 0 ng/mL (ref 0.00–0.08)

## 2016-07-29 NOTE — ED Triage Notes (Signed)
PT REPORTS FOR THE PAST 5 DAYS SHE HAS HAD LEFT SIDED ARM PAIN THAT GOES INTO HER JAW. PT C/O OF HEART BURN AND INDIGESTION AFTER EATING ALSO.

## 2016-07-29 NOTE — Telephone Encounter (Signed)
Patient complaining of left arm pain moving towards jaw, Left leg stiffness. Abdominal pressure & pain with heartburn that's been going on for 4 days. She has not experienced these symptoms before. Has been taking Ibuprofen as needed but  symptoms persist and is now more concerned. Denies chest pain but states pain to left side of her back. Denies recent injury or fall.  Advised to go to urgent care or ED. Verbalized understanding & agreed to seek care.

## 2016-07-29 NOTE — Telephone Encounter (Signed)
Pt states she has been having left arm pain X 4 days with abdomen pain that radiates up to neck and face and is very concerned about what to do.  Please call back.

## 2016-08-01 ENCOUNTER — Ambulatory Visit: Payer: Self-pay

## 2016-12-11 MED FILL — SPIRIVA 18 MCG CP-HANDIHALE: 18 | 30 days supply | Qty: 30 | Fill #2

## 2016-12-11 MED FILL — PROVENTIL HFA 108 (90 BASE): 108 (90 BAS | 25 days supply | Qty: 7 | Fill #2

## 2016-12-16 IMAGING — CT CT ANGIO CHEST
2 of 6 series · 18 of 36 positions shown · IV contrast (Omni 300)
Comparison: Chest x-ray dated 02/20/2016

CLINICAL DATA: Right-sided chest pain and shortness of breath.
Productive cough.

EXAM:
CT ANGIOGRAPHY CHEST WITH CONTRAST
TECHNIQUE: Multidetector CT imaging of the chest was performed using the
standard protocol during bolus administration of intravenous
contrast. Multiplanar CT image reconstructions and MIPs were
obtained to evaluate the vascular anatomy.
CONTRAST:  73 cc Isovue 370

[Series 6: pe thins · axial · 0.74mm/px · z∈[+980,+1222]mm · 17 of 274 slices shown]
[im 16/274  lung]
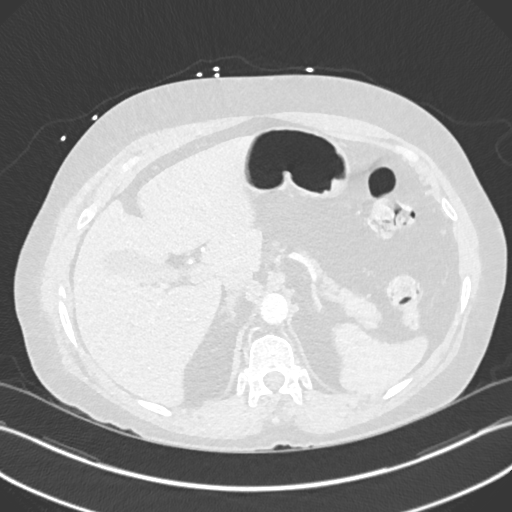
[im 31/274  mediastinal]
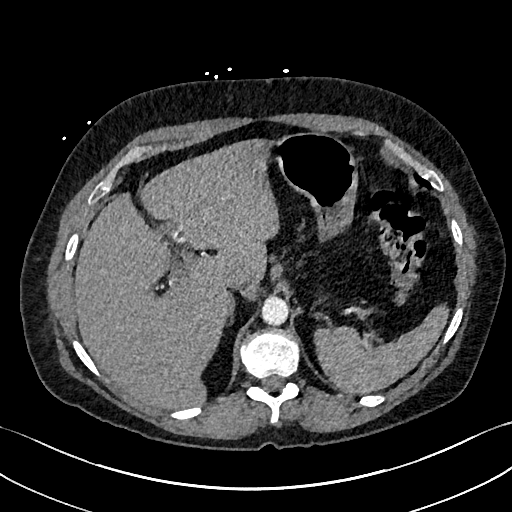
[im 46/274  lung]
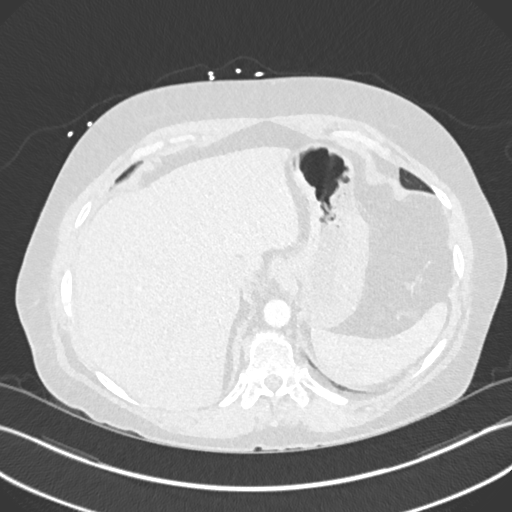
[im 61/274  mediastinal]
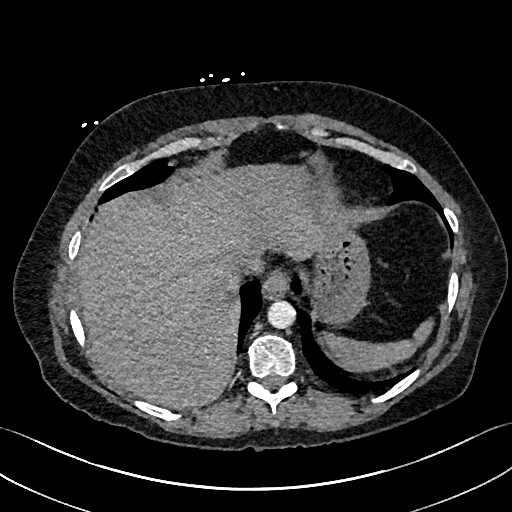
[im 76/274  lung]
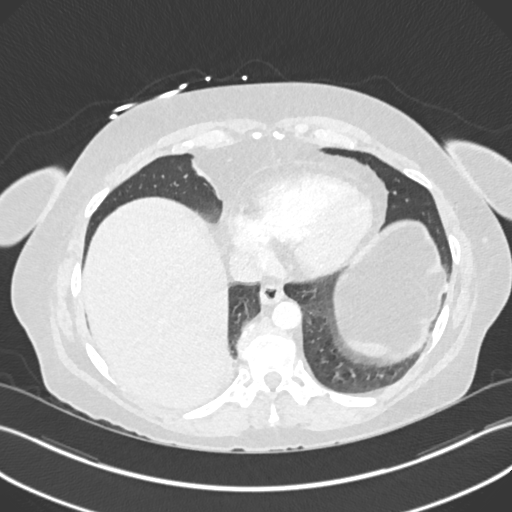
[im 92/274  mediastinal]
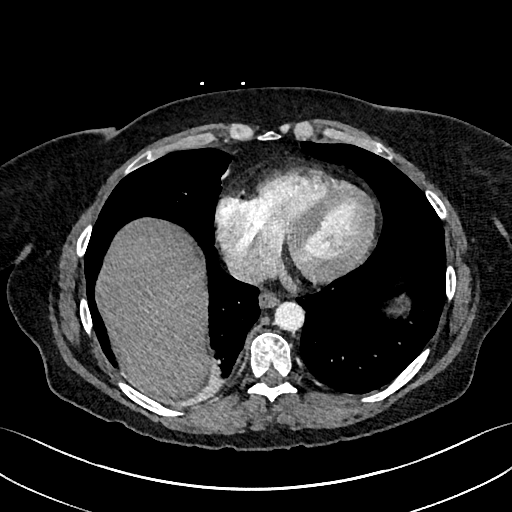
[im 107/274  lung]
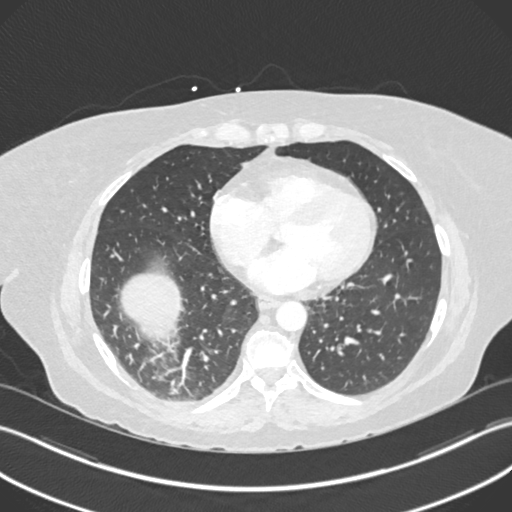
[im 122/274  mediastinal]
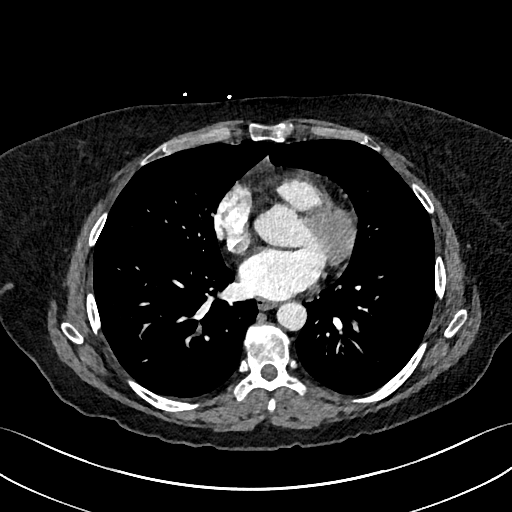
[im 137/274  lung]
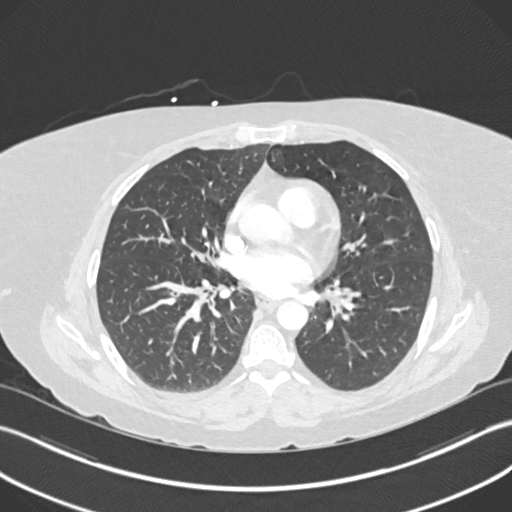
[im 152/274  mediastinal]
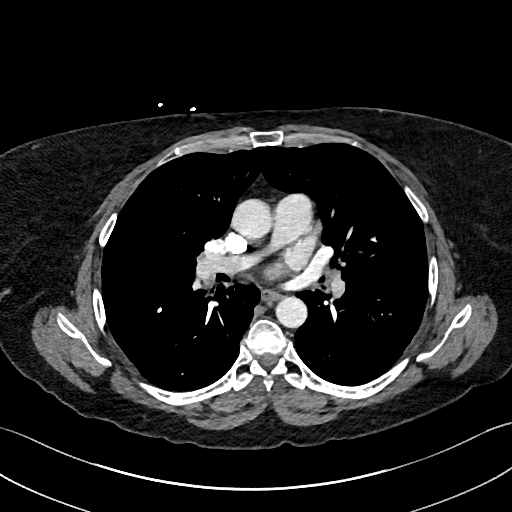
[im 167/274  lung]
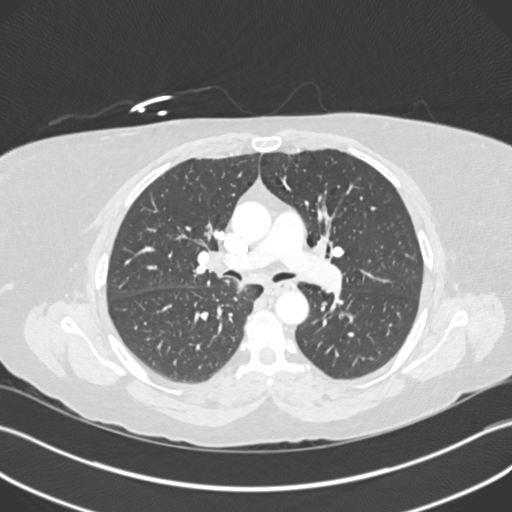
[im 183/274  mediastinal]
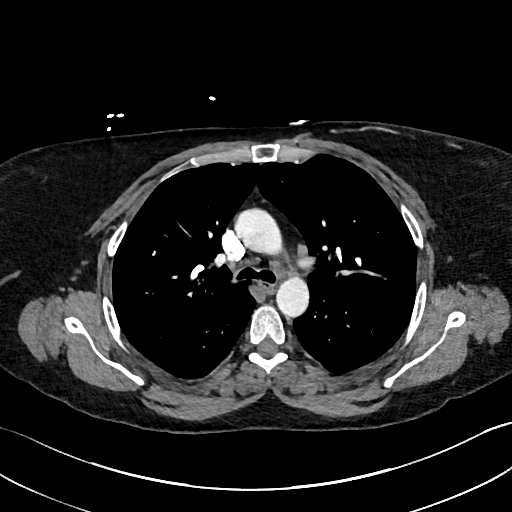
[im 198/274  lung]
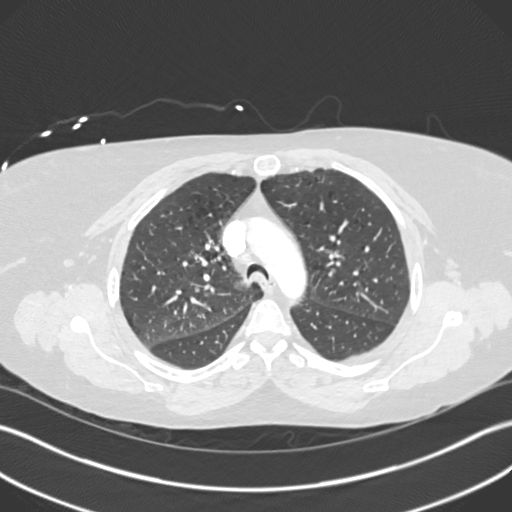
[im 213/274  mediastinal]
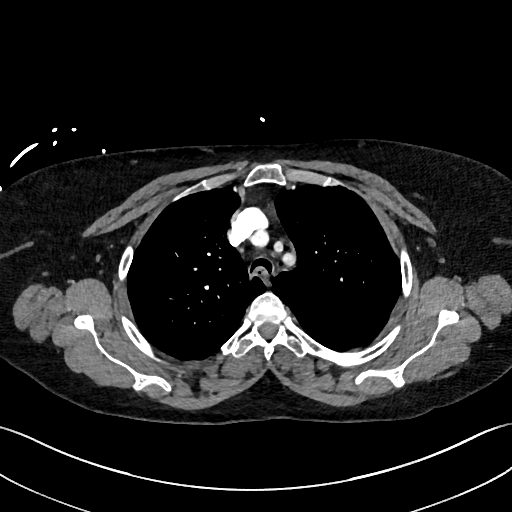
[im 228/274  lung]
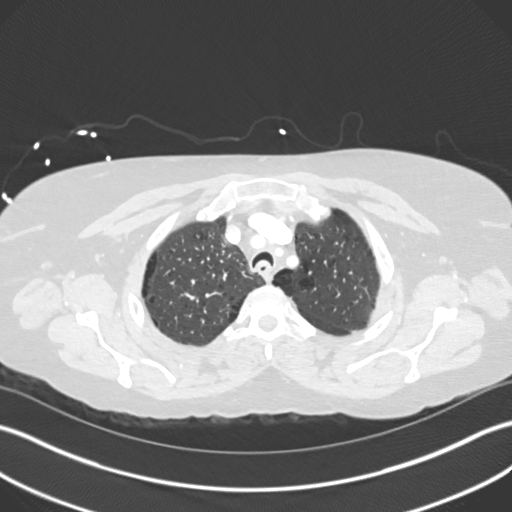
[im 243/274  mediastinal]
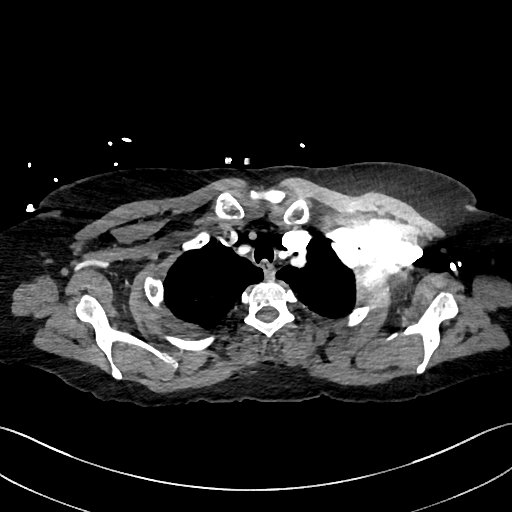
[im 258/274  lung]
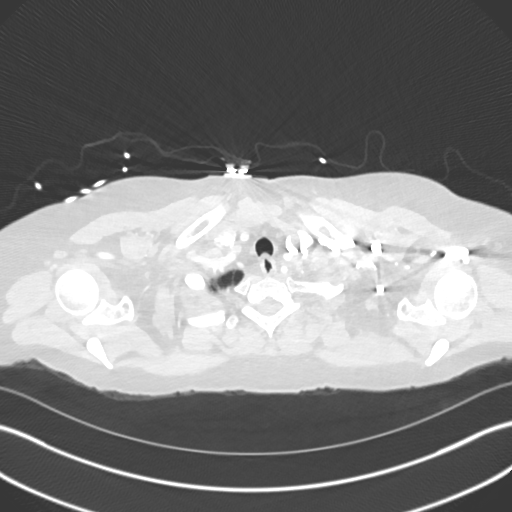

[Series 7: pe 2mm cor · coronal · 0.59mm/px · 1 of 131 slices shown]
[im 66/131  mediastinal]
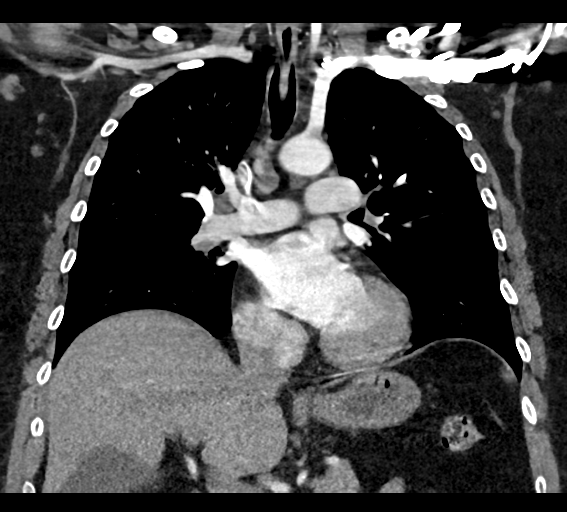

[18 of 36 positions shown; findings below may reference images not displayed]

FINDINGS: Cardiovascular: Satisfactory opacification of the pulmonary arteries
to the segmental level. No evidence of pulmonary embolism. Normal
heart size. No pericardial effusion.

Mediastinum/Nodes: No enlarged mediastinal, hilar, or axillary lymph
nodes. Thyroid gland, trachea, and esophagus demonstrate no
significant findings.

Lungs/Pleura: Peripheral blebs in the upper lobes. Diffuse
peribronchial thickening. Tracheomalacia including the right
mainstem bronchus. Slight atelectasis at the right lung base
posteriorly. No effusions.

Upper Abdomen: Sludge in the gallbladder.  Otherwise normal.

Musculoskeletal: No chest wall abnormality. No acute or significant
osseous findings.

Review of the MIP images confirms the above findings.
IMPRESSION: 1. No pulmonary emboli.
2. Extensive bilateral peribronchial thickening consistent with
bronchitis.
3. Emphysema.
4. Tracheomalacia.

## 2017-04-30 ENCOUNTER — Ambulatory Visit: Payer: Self-pay | Admitting: Internal Medicine

## 2017-04-30 ENCOUNTER — Encounter: Payer: Self-pay | Admitting: Internal Medicine

## 2017-04-30 ENCOUNTER — Other Ambulatory Visit: Payer: Self-pay

## 2017-04-30 VITALS — BP 125/84 | HR 98 | Temp 98.2°F | Ht 63.0 in | Wt 172.7 lb

## 2017-04-30 DIAGNOSIS — J449 Chronic obstructive pulmonary disease, unspecified: Secondary | ICD-10-CM

## 2017-04-30 DIAGNOSIS — F331 Major depressive disorder, recurrent, moderate: Secondary | ICD-10-CM

## 2017-04-30 DIAGNOSIS — Z0000 Encounter for general adult medical examination without abnormal findings: Secondary | ICD-10-CM

## 2017-04-30 DIAGNOSIS — Z720 Tobacco use: Secondary | ICD-10-CM

## 2017-04-30 DIAGNOSIS — Z7951 Long term (current) use of inhaled steroids: Secondary | ICD-10-CM

## 2017-04-30 DIAGNOSIS — L304 Erythema intertrigo: Secondary | ICD-10-CM

## 2017-04-30 DIAGNOSIS — Z79899 Other long term (current) drug therapy: Secondary | ICD-10-CM

## 2017-04-30 DIAGNOSIS — R21 Rash and other nonspecific skin eruption: Secondary | ICD-10-CM

## 2017-04-30 DIAGNOSIS — F339 Major depressive disorder, recurrent, unspecified: Secondary | ICD-10-CM

## 2017-04-30 MED ORDER — BUDESONIDE-FORMOTEROL FUMARATE 160-4.5 MCG/ACT IN AERO: 2.0000 | INHALATION_SPRAY | Freq: Two times a day (BID) | RESPIRATORY_TRACT | 12 refills | Status: DC

## 2017-04-30 MED ORDER — TIOTROPIUM BROMIDE MONOHYDRATE 18 MCG IN CAPS: 18.0000 ug | ORAL_CAPSULE | Freq: Every day | RESPIRATORY_TRACT | 11 refills | Status: DC

## 2017-04-30 MED ORDER — ALBUTEROL SULFATE HFA 108 (90 BASE) MCG/ACT IN AERS: 1.0000 | INHALATION_SPRAY | Freq: Four times a day (QID) | RESPIRATORY_TRACT | 11 refills | Status: DC | PRN

## 2017-04-30 MED ORDER — NYSTATIN 100000 UNIT/GM EX POWD: Freq: Four times a day (QID) | CUTANEOUS | 0 refills | Status: DC

## 2017-04-30 MED ORDER — CITALOPRAM HYDROBROMIDE 40 MG PO TABS: 40.0000 mg | ORAL_TABLET | Freq: Every day | ORAL | 5 refills | Status: DC

## 2017-04-30 MED FILL — NYSTATIN 100,000 UNIT/GM PO: 100000 | 5 days supply | Qty: 15 | Fill #0

## 2017-04-30 MED FILL — SPIRIVA 18 MCG CP-HANDIHALE: 18 | 30 days supply | Qty: 30 | Fill #0

## 2017-04-30 MED FILL — PROVENTIL HFA 108 (90 BASE): 108 (90 BAS | 25 days supply | Qty: 7 | Fill #0

## 2017-04-30 MED FILL — SYMBICORT 160-4.5 MCG INH: 160-4.5 | 30 days supply | Qty: 10 | Fill #0

## 2017-04-30 MED FILL — CITALOPRAM HBR 40 MG TABLET: 40 | 30 days supply | Qty: 30 | Fill #0

## 2017-04-30 NOTE — Patient Instructions (Signed)
Ms. Patricia Farrell,  It was a pleasure seeing you today.  Your prescriptions have been sent to the pharmacy.  Please call the clinic with any questions or concerns.

## 2017-04-30 NOTE — Progress Notes (Signed)
   CC: Follow up for COPD  HPI:  Patricia Farrell is a 52 y.o. female with history noted below that presents to the internal medicine clinic for follow-up on COPD. Please see problem based charting for the status of patient's chronic medical conditions.   Past Medical History:  Diagnosis Date  . Acute bronchopneumonia 04/17/2012  . Candida infection, esophageal (HCC) 04/30/2012   Possible candidal esophagitis   . COPD (chronic obstructive pulmonary disease) (HCC) 04/19/2012  . Depression   . Endometriosis   . GERD (gastroesophageal reflux disease)   . Normocytic anemia 01/12/2013  . Strain of right trapezius muscle 03/11/2014    Review of Systems:  Review of Systems  Constitutional: Negative for malaise/fatigue.  Respiratory: Negative for shortness of breath.   Cardiovascular: Negative for chest pain.  Psychiatric/Behavioral: Positive for depression. Negative for suicidal ideas.     Physical Exam:  Vitals:   04/30/17 1456  BP: 125/84  Pulse: 98  Temp: 98.2 F (36.8 C)  TempSrc: Oral  SpO2: 96%  Weight: 172 lb 11.2 oz (78.3 kg)  Height: 5\' 3"  (1.6 m)   Physical Exam  Constitutional: She is well-developed, well-nourished, and in no distress.  Cardiovascular: Normal rate, regular rhythm and normal heart sounds. Exam reveals no gallop and no friction rub.  No murmur heard. Pulmonary/Chest: Effort normal. No respiratory distress. She has no rales.  Minimal expiratory wheezing  Skin: Skin is warm and dry.  Intertrigo under left breast    Assessment & Plan:   See encounters tab for problem based medical decision making.   Patient discussed with Dr. Cyndie ChimeGranfortuna

## 2017-05-01 NOTE — Assessment & Plan Note (Signed)
Assessment: Tobacco abuse Patient states she smokes half a pack a day and has for the past 28 years. She states she would like to try and quit. She has tried Nicorette gum in the past and states this works well. She says she is using a vape to get off of cigarettes. I encouraged she stopped smoking cigarettes and use the Nicorette gum  Plan - nicorette gum - spent >3 min on smoking cessation counsling

## 2017-05-01 NOTE — Assessment & Plan Note (Signed)
Assessment:  Chronic obstructive pulmonary disease Patient denies COPD exacerbations in the past year. She has been without her Symbicort and Spiriva for several months and would like refills today.  Minimal wheezing on exam.  No cough w/productive sputum.    Plan  Refill Symbicort, Spiriva and albuterol

## 2017-05-01 NOTE — Assessment & Plan Note (Signed)
Assessment: Depression Patient states she has a history of depression and stopped taking her Celexa several months ago. She states that today she feels more depressed and would like to restart her medication. PHQ9 was 14 noting moderate depression. She states that she is currently in a stressful relationship. She denies physical danger or thoughts of suicide. I offered her referral to psychology however she declined at this time. Will restart Celexa   Plan  -Celexa

## 2017-05-01 NOTE — Assessment & Plan Note (Signed)
Assessment:  Intertrigo under her left breast   Patient states for the past 3-4 months she's had a rash under her left breast. She states that it is pleuritic in nature. On exam there is a well-demarcated intertrigo lesion under her left breast. Will give nystatin powder.  Plan -Nystatin powder

## 2017-05-01 NOTE — Assessment & Plan Note (Signed)
Assessment: Healthcare maintenance Patient due for colonoscopy and mammogram.   patient states she doesn't have insurance at the time and would like to decline colonoscopy and mammogram until she is able to establish insurance.   Plan  -Recommended she meet with [facility] for financial counseling  -Offer colonoscopy and mammogram and next visit

## 2017-05-04 NOTE — Progress Notes (Signed)
Medicine attending: Medical history, presenting problems, physical findings, and medications, reviewed with resident physician Dr Jessica Hoffman on the day of the patient visit and I concur with her evaluation and management plan. 

## 2017-06-16 MED FILL — CITALOPRAM HBR 40 MG TABLET: 40 | 30 days supply | Qty: 30 | Fill #1

## 2017-07-29 MED FILL — CITALOPRAM HBR 40 MG TABLET: 40 | 30 days supply | Qty: 30 | Fill #2

## 2017-09-03 MED FILL — CITALOPRAM HBR 40 MG TABLET: 40 | 30 days supply | Qty: 30 | Fill #3

## 2017-10-16 MED FILL — SPIRIVA 18 MCG CP-HANDIHALE: 18 | 30 days supply | Qty: 30 | Fill #1

## 2017-10-16 MED FILL — PROVENTIL HFA 108 (90 BASE): 108 (90 BAS | 25 days supply | Qty: 7 | Fill #1

## 2017-10-16 MED FILL — SYMBICORT 160-4.5 MCG INH: 160-4.5 | 30 days supply | Qty: 10 | Fill #1

## 2017-10-16 MED FILL — CITALOPRAM HBR 40 MG TABLET: 40 | 30 days supply | Qty: 30 | Fill #4

## 2017-12-10 IMAGING — DX DG CHEST 2V
2 series · 2 of 2 positions shown · non-contrast
Comparison: Chest radiograph and CTA of the chest performed
02/20/2016

CLINICAL DATA: Acute onset of generalized chest pain and tightness.
Left arm pain. Slight shortness of breath. Initial encounter.

EXAM:
CHEST  2 VIEW

[chest pa]
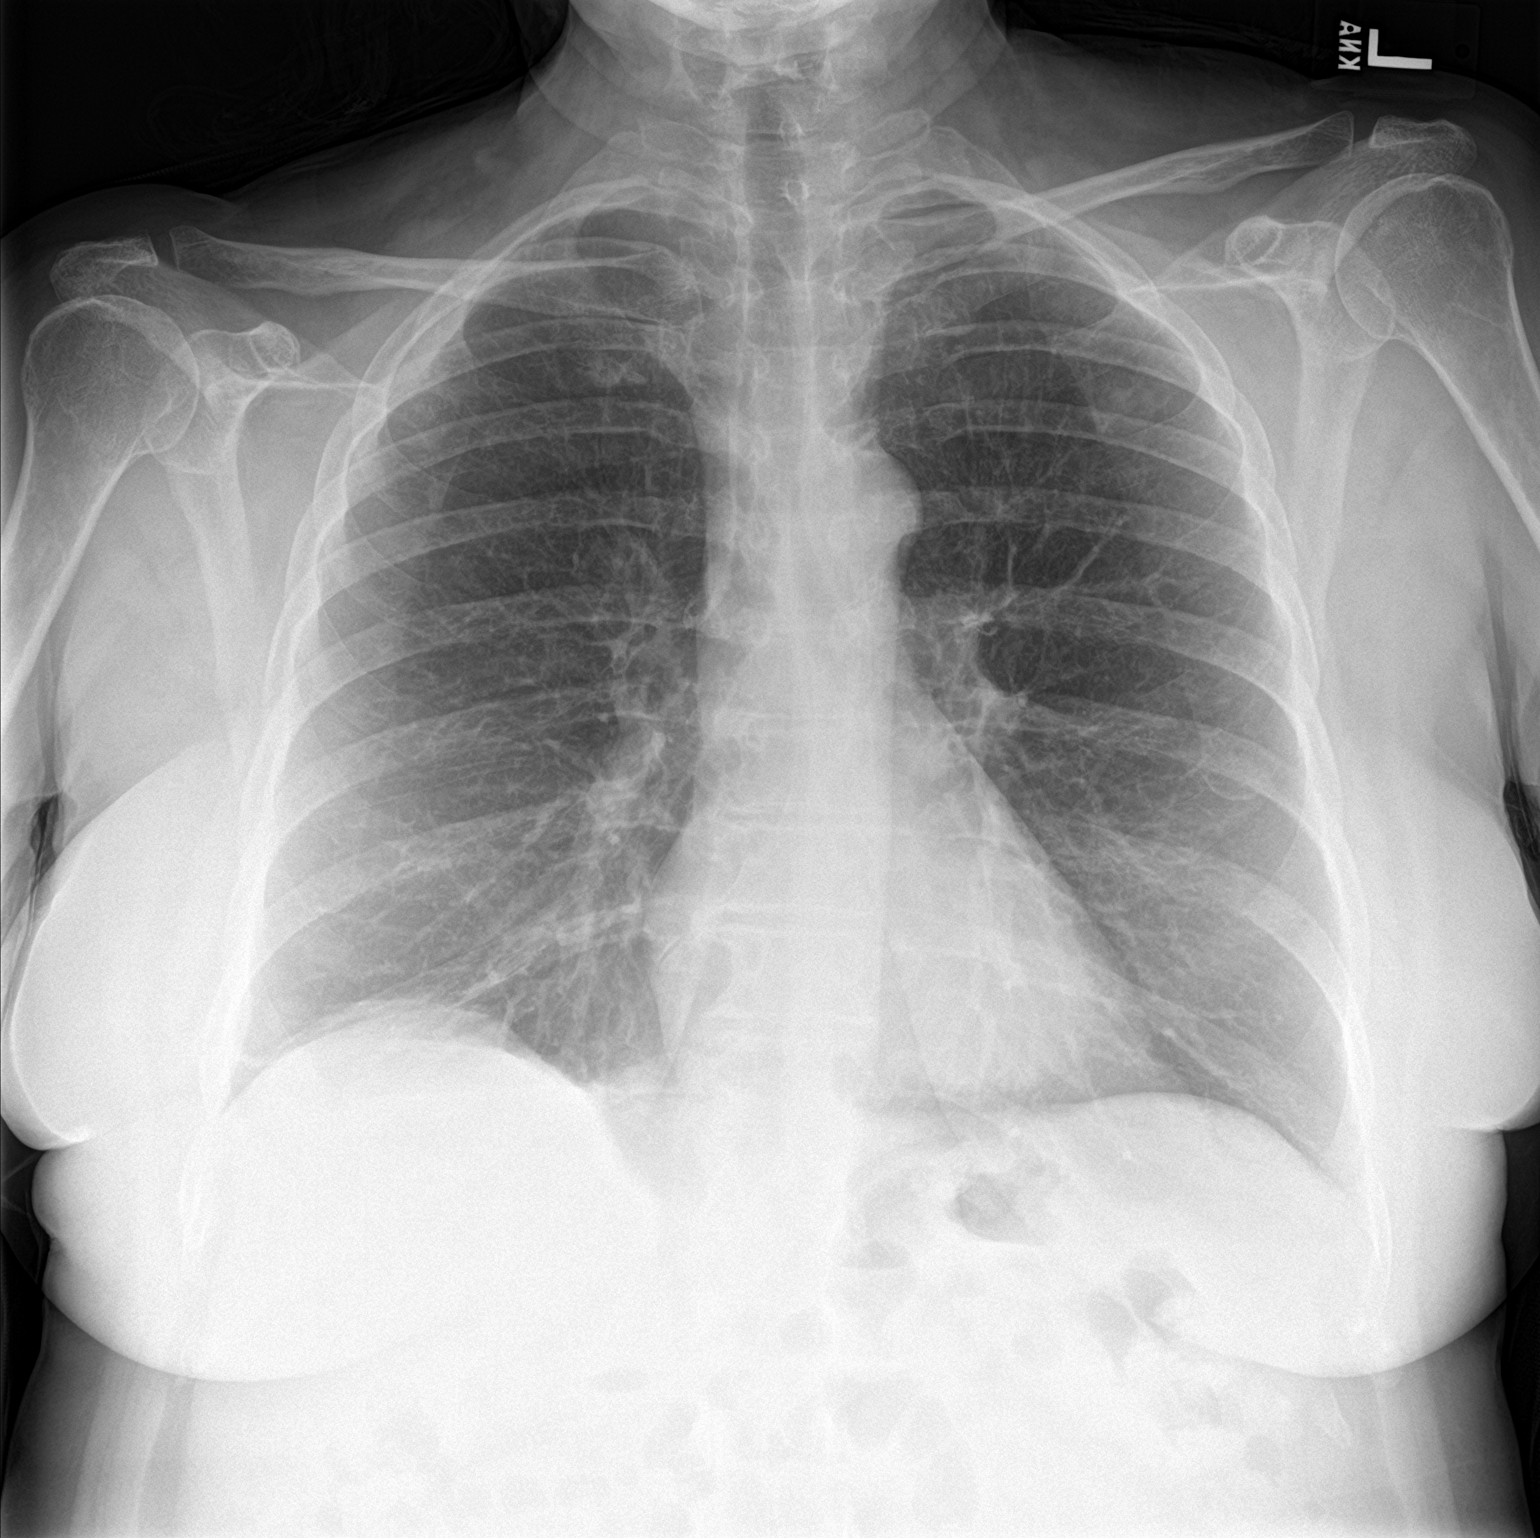

[chest lat]
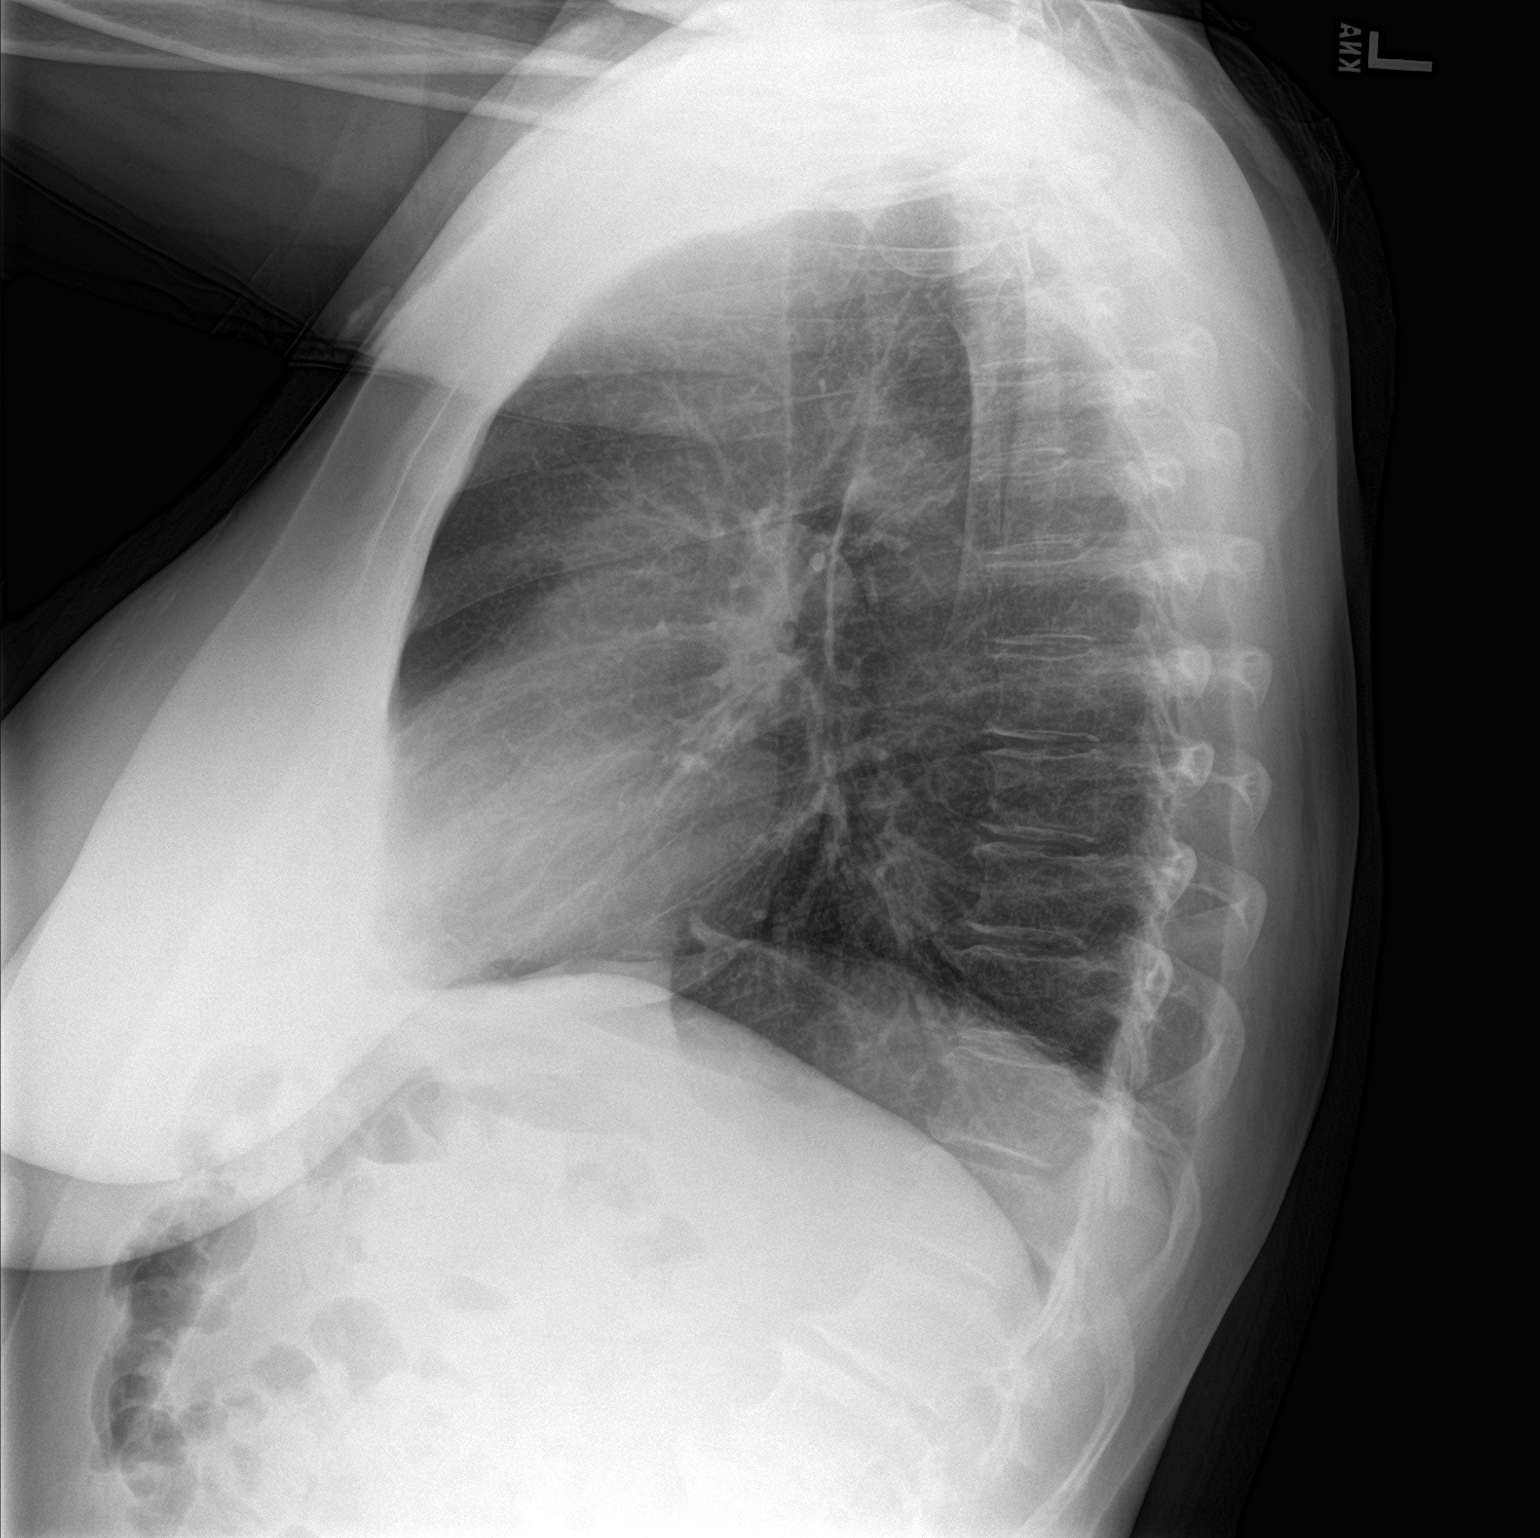

[2 of 2 positions shown; findings below may reference images not displayed]

FINDINGS: The lungs are well-aerated and clear. There is no evidence of focal
opacification, pleural effusion or pneumothorax.

The heart is normal in size; the mediastinal contour is within
normal limits. No acute osseous abnormalities are seen.
IMPRESSION: No acute cardiopulmonary process seen.

## 2018-02-26 MED FILL — CITALOPRAM HBR 40 MG TABLET: 40 | 30 days supply | Qty: 30 | Fill #5

## 2018-02-26 MED FILL — SPIRIVA 18 MCG CP-HANDIHALE: 18 | 30 days supply | Qty: 30 | Fill #2

## 2018-02-26 MED FILL — SYMBICORT 160-4.5 MCG INH: 160-4.5 | 30 days supply | Qty: 10 | Fill #2

## 2018-02-26 MED FILL — PROAIR HFA 90 MCG INHALER: 108 (90 BAS | 25 days supply | Qty: 9 | Fill #2

## 2018-03-19 ENCOUNTER — Ambulatory Visit: Payer: Self-pay

## 2018-06-24 ENCOUNTER — Ambulatory Visit: Payer: Self-pay | Admitting: Pharmacist

## 2018-06-24 ENCOUNTER — Encounter: Payer: Self-pay | Admitting: Internal Medicine

## 2018-06-30 ENCOUNTER — Other Ambulatory Visit: Payer: Self-pay | Admitting: Pharmacist

## 2018-06-30 DIAGNOSIS — Z0000 Encounter for general adult medical examination without abnormal findings: Secondary | ICD-10-CM

## 2018-06-30 MED ORDER — INFLUENZA VAC SPLIT QUAD 0.5 ML IM SUSY: 0.5000 mL | PREFILLED_SYRINGE | Freq: Once | INTRAMUSCULAR | 0 refills | Status: DC

## 2018-06-30 NOTE — Addendum Note (Signed)
Addended by: Mliss Fritz on: 06/30/2018 10:39 AM   Modules accepted: Orders

## 2018-07-15 ENCOUNTER — Telehealth: Payer: Self-pay | Admitting: Pharmacist

## 2018-07-15 DIAGNOSIS — J449 Chronic obstructive pulmonary disease, unspecified: Secondary | ICD-10-CM

## 2018-07-15 MED ORDER — UMECLIDINIUM-VILANTEROL 62.5-25 MCG/INH IN AEPB: 1.0000 | INHALATION_SPRAY | Freq: Every day | RESPIRATORY_TRACT | 11 refills | Status: DC

## 2018-07-15 MED ORDER — ALBUTEROL SULFATE HFA 108 (90 BASE) MCG/ACT IN AERS: 1.0000 | INHALATION_SPRAY | Freq: Four times a day (QID) | RESPIRATORY_TRACT | 3 refills | Status: DC | PRN

## 2018-07-15 NOTE — Progress Notes (Addendum)
Patricia Farrell is a 53 y.o. female was contacted for de-escalation of inhaled corticosteroids.  Patient's current COPD medication regimen consists of: Symbicort, Spiriva, and PRN albuterol. Uses albuterol twice per week. Reports stable COPD control. She is still smoking 1/2 ppd and requested help with smoking cessation.  No Known Allergies Medication Sig  albuterol (PROVENTIL HFA;VENTOLIN HFA) 108 (90 Base) MCG/ACT inhaler Inhale 1-2 puffs into the lungs every 6 (six) hours as needed for wheezing or shortness of breath.  aspirin EC 81 MG tablet Take 81 mg by mouth daily.    budesonide-formoterol (SYMBICORT) 160-4.5 MCG/ACT inhaler Inhale 2 puffs into the lungs 2 (two) times daily.  calcium carbonate (OS-CAL - DOSED IN MG OF ELEMENTAL CALCIUM) 1250 MG tablet Take 1 tablet by mouth daily.    citalopram (CELEXA) 40 MG tablet Take 1 tablet (40 mg total) by mouth daily. IM program.  Cyanocobalamin (VITAMIN B-12 CR PO) Take 1 tablet by mouth daily.  diclofenac (CATAFLAM) 50 MG tablet Take 1 tablet (50 mg total) by mouth 3 (three) times daily as needed.  fish oil-omega-3 fatty acids 1000 MG capsule Take 2 g by mouth daily.    Multiple Vitamins-Minerals (MULTIVITAMINS THER. W/MINERALS) TABS Take 1 tablet by mouth daily.    nicotine (NICODERM CQ - DOSED IN MG/24 HOURS) 14 mg/24hr patch Place 1 patch (14 mg total) onto the skin daily.  nystatin (MYCOSTATIN/NYSTOP) powder Apply topically 4 (four) times daily.  omeprazole (PRILOSEC) 40 MG capsule Take 1 capsule (40 mg total) by mouth daily. IM program  tiotropium (SPIRIVA HANDIHALER) 18 MCG inhalation capsule Place 1 capsule (18 mcg total) into inhaler and inhale daily. IM program.   Past Medical History:  Diagnosis Date  . Acute bronchopneumonia 04/17/2012  . Candida infection, esophageal (HCC) 04/30/2012   Possible candidal esophagitis   . COPD (chronic obstructive pulmonary disease) (HCC) 04/19/2012  . Depression   . Endometriosis   . GERD  (gastroesophageal reflux disease)   . Normocytic anemia 01/12/2013  . Strain of right trapezius muscle 03/11/2014   Social History   Socioeconomic History  . Marital status: Married    Spouse name: Not on file  . Number of children: Not on file  . Years of education: Not on file  . Highest education level: Not on file  Occupational History  . Not on file  Social Needs  . Financial resource strain: Not on file  . Food insecurity:    Worry: Not on file    Inability: Not on file  . Transportation needs:    Medical: Not on file    Non-medical: Not on file  Tobacco Use  . Smoking status: Current Every Day Smoker    Packs/day: 0.50    Years: 36.00    Pack years: 18.00    Types: Cigarettes, E-cigarettes    Last attempt to quit: 04/13/2012    Years since quitting: 6.2  . Smokeless tobacco: Never Used  . Tobacco comment: 02/20/2016 "use e-cigarettes off and on"  Substance and Sexual Activity  . Alcohol use: Yes    Alcohol/week: 6.0 standard drinks    Types: 4 Glasses of wine, 2 Cans of beer per week    Comment: 02/20/2016  Endorses prior heavy use.   . Drug use: No    Comment: Patient daughter in room and patient distressed. Will discuss drug use at later evaluation.  . Sexual activity: Yes    Birth control/protection: None, Surgical  Lifestyle  . Physical activity:    Days  per week: Not on file    Minutes per session: Not on file  . Stress: Not on file  Relationships  . Social connections:    Talks on phone: Not on file    Gets together: Not on file    Attends religious service: Not on file    Active member of club or organization: Not on file    Attends meetings of clubs or organizations: Not on file    Relationship status: Not on file  Other Topics Concern  . Not on file  Social History Narrative   Works at [employer].    Family History  Problem Relation Age of Onset  . Diabetes Sister   . Diabetes Brother    O: Ht Readings from Last 2 Encounters:   04/30/17 5\' 3"  (1.6 m)  03/04/16 5\' 3"  (1.6 m)   Wt Readings from Last 2 Encounters:  04/30/17 172 lb 11.2 oz (78.3 kg)  03/04/16 173 lb 6.4 oz (78.7 kg)   There is no height or weight on file to calculate BMI.  CAT ASSESSMENT  Rank each of the following items on a scale of 0 to 5 (with 5 being most severe) Write a # 0-5 in each box  I never cough (0) > I cough all the time (5) 1  I have no phlegm (mucus) in my chest (0) > My chest is completely full of phlegm (mucus) (5) 0  My chest does not feel tight at all (0) > My chest feels very tight (5) 0  When I walk up a hill or one flight of stairs I am not breathless (0) > When I walk up a hill or one flight of stairs I am very breathless (5) 2  I am not limited doing any activities at home (0) > I am very limited doing activities at home (5) 2  I am confident leaving my home despite my lung function (0) > I am not at all confident leaving my home because of my lung condition (5)  0  I sleep soundly (0) > I don't sleep soundly because of my lung condition (5) 0  I have lots of energy (0) > I have no energy at all (5) 3   Total CAT Score: 8  A/P: . Based on a review of the patient's current COPD medications and symptoms, it is appropriate to try switching from Spiriva/Symbicort to Anoro. Will need to order a baseline eosinophil count at upcoming appointment in May. Meanwhile, we will work on [employer] patient assistance application.  . Will also apply for Ventolin patient assistance program and [facility] for help with medication access. . Counseled patient on proper use and possible side effects, benefits of trying to de-escalate inhaled corticosteroid.  . Counseled patient to report any changes in COPD medications, breathing, or shortness of breath. If symptoms arise or worsen, can switch back to Symbicort/Spiriva. . [facility], will provide smoking cessation support and follow up with patient to assess COPD  control.  Follow up phone call 1 month and 3 months. Patient was advised to contact clinic if any changes in condition or questions regarding medications arise. The patient verbalized understanding of information provided by repeating back concepts discussed.

## 2018-07-16 MED ORDER — UMECLIDINIUM-VILANTEROL 62.5-25 MCG/INH IN AEPB: 1.0000 | INHALATION_SPRAY | Freq: Every day | RESPIRATORY_TRACT | 11 refills | Status: DC

## 2018-07-16 MED ORDER — ALBUTEROL SULFATE HFA 108 (90 BASE) MCG/ACT IN AERS: 1.0000 | INHALATION_SPRAY | Freq: Four times a day (QID) | RESPIRATORY_TRACT | 3 refills | Status: DC | PRN

## 2018-07-16 NOTE — Addendum Note (Signed)
Addended by: KIM, JENNIFER J on: 07/16/2018 01:02 PM   Modules accepted: Orders  

## 2018-08-03 ENCOUNTER — Telehealth: Payer: Self-pay | Admitting: Pharmacist

## 2018-08-04 ENCOUNTER — Telehealth: Payer: Self-pay

## 2018-08-04 NOTE — Telephone Encounter (Signed)
Unable to reach patient.

## 2018-08-04 NOTE — Telephone Encounter (Addendum)
Patricia Farrell is a 53 y.o. female reports to clinical pharmacist appointment for COPD medication management. Patient did not bring medication.   Patient's current COPD medication regimen consists of: Symbicort/Spiriva and albuterol   Patient denies COPD related symptoms.  Patient report use of rescue inhaler with an estimated use at about 1-2 times per week.   Patient denies occurrence of a COPD exacerbation since last appointment.   Patient report a history of smoking with a current pack-per year of 1 PPD  No Known Allergies Medication Sig  albuterol (PROVENTIL HFA) 108 (90 Base) MCG/ACT inhaler Inhale 1-2 puffs into the lungs every 6 (six) hours as needed for wheezing or shortness of breath.  aspirin EC 81 MG tablet Take 81 mg by mouth daily.    calcium carbonate (OS-CAL - DOSED IN MG OF ELEMENTAL CALCIUM) 1250 MG tablet Take 1 tablet by mouth daily.    citalopram (CELEXA) 40 MG tablet Take 1 tablet (40 mg total) by mouth daily. IM program.  Cyanocobalamin (VITAMIN B-12 CR PO) Take 1 tablet by mouth daily.  diclofenac (CATAFLAM) 50 MG tablet Take 1 tablet (50 mg total) by mouth 3 (three) times daily as needed.  fish oil-omega-3 fatty acids 1000 MG capsule Take 2 g by mouth daily.    Multiple Vitamins-Minerals (MULTIVITAMINS THER. W/MINERALS) TABS Take 1 tablet by mouth daily.    nicotine (NICODERM CQ - DOSED IN MG/24 HOURS) 14 mg/24hr patch Place 1 patch (14 mg total) onto the skin daily.  nystatin (MYCOSTATIN/NYSTOP) powder Apply topically 4 (four) times daily.  omeprazole (PRILOSEC) 40 MG capsule Take 1 capsule (40 mg total) by mouth daily. IM program  umeclidinium-vilanterol (ANORO ELLIPTA) 62.5-25 MCG/INH AEPB Inhale 1 puff into the lungs daily.   Past Medical History:  Diagnosis Date  . Acute bronchopneumonia 04/17/2012  . Candida infection, esophageal (HCC) 04/30/2012   Possible candidal esophagitis   . COPD (chronic obstructive pulmonary disease) (HCC) 04/19/2012  .  Depression   . Endometriosis   . GERD (gastroesophageal reflux disease)   . Normocytic anemia 01/12/2013  . Strain of right trapezius muscle 03/11/2014   Social History   Socioeconomic History  . Marital status: Married    Spouse name: Not on file  . Number of children: Not on file  . Years of education: Not on file  . Highest education level: Not on file  Occupational History  . Not on file  Social Needs  . Financial resource strain: Not on file  . Food insecurity:    Worry: Not on file    Inability: Not on file  . Transportation needs:    Medical: Not on file    Non-medical: Not on file  Tobacco Use  . Smoking status: Current Every Day Smoker    Packs/day: 0.50    Years: 36.00    Pack years: 18.00    Types: Cigarettes, E-cigarettes    Last attempt to quit: 04/13/2012    Years since quitting: 6.3  . Smokeless tobacco: Never Used  . Tobacco comment: 02/20/2016 "use e-cigarettes off and on"  Substance and Sexual Activity  . Alcohol use: Yes    Alcohol/week: 6.0 standard drinks    Types: 4 Glasses of wine, 2 Cans of beer per week    Comment: 02/20/2016  Endorses prior heavy use.   . Drug use: No    Comment: Patient daughter in room and patient distressed. Will discuss drug use at later evaluation.  . Sexual activity: Yes    Birth  control/protection: None, Surgical  Lifestyle  . Physical activity:    Days per week: Not on file    Minutes per session: Not on file  . Stress: Not on file  Relationships  . Social connections:    Talks on phone: Not on file    Gets together: Not on file    Attends religious service: Not on file    Active member of club or organization: Not on file    Attends meetings of clubs or organizations: Not on file    Relationship status: Not on file  Other Topics Concern  . Not on file  Social History Narrative   Works at [employer].    Family History  Problem Relation Age of Onset  . Diabetes Sister   . Diabetes Brother     O: Ht  Readings from Last 2 Encounters:  04/30/17 5\' 3"  (1.6 m)  03/04/16 5\' 3"  (1.6 m)   Wt Readings from Last 2 Encounters:  04/30/17 172 lb 11.2 oz (78.3 kg)  03/04/16 173 lb 6.4 oz (78.7 kg)   There is no height or weight on file to calculate BMI.  CAT ASSESSMENT  Rank each of the following items on a scale of 0 to 5 (with 5 being most severe) Write a # 0-5 in each box  I never cough (0) > I cough all the time (5) 2  I have no phlegm (mucus) in my chest (0) > My chest is completely full of phlegm (mucus) (5) 2  My chest does not feel tight at all (0) > My chest feels very tight (5) 2  When I walk up a hill or one flight of stairs I am not breathless (0) > When I walk up a hill or one flight of stairs I am very breathless (5) 2  I am not limited doing any activities at home (0) > I am very limited doing activities at home (5) 2  I am confident leaving my home despite my lung function (0) > I am not at all confident leaving my home because of my lung condition (5)  2  I sleep soundly (0) > I don't sleep soundly because of my lung condition (5) 2  I have lots of energy (0) > I have no energy at all (5) 3     Total CAT Score: 17 Most recent blood eosinophil count was 400 cells/microL taken on 08/13/2018.  A/P:  Findings/Recommendations: . Patient was switched from Symbicort/Spiriva to [facility] but has not received her Anoro in the mail yet. Will start Anoro once she receives it.  . Patient reported wanting to switch to a new antidepressant because her current med is not working. Pt mentioned wanting to try Prozac because it worked well for her daughter.   . Counseled patient to report any changes in COPD medications, breathing, or shortness of breath.

## 2018-08-12 ENCOUNTER — Ambulatory Visit (INDEPENDENT_AMBULATORY_CARE_PROVIDER_SITE_OTHER): Payer: Self-pay | Admitting: Internal Medicine

## 2018-08-12 ENCOUNTER — Ambulatory Visit: Payer: Self-pay | Admitting: Pharmacist

## 2018-08-12 ENCOUNTER — Encounter: Payer: Self-pay | Admitting: Pharmacist

## 2018-08-12 ENCOUNTER — Other Ambulatory Visit: Payer: Self-pay

## 2018-08-12 DIAGNOSIS — F330 Major depressive disorder, recurrent, mild: Secondary | ICD-10-CM

## 2018-08-12 DIAGNOSIS — J449 Chronic obstructive pulmonary disease, unspecified: Secondary | ICD-10-CM

## 2018-08-12 DIAGNOSIS — F339 Major depressive disorder, recurrent, unspecified: Secondary | ICD-10-CM

## 2018-08-12 DIAGNOSIS — Z7951 Long term (current) use of inhaled steroids: Secondary | ICD-10-CM

## 2018-08-12 DIAGNOSIS — Z79899 Other long term (current) drug therapy: Secondary | ICD-10-CM

## 2018-08-12 DIAGNOSIS — F419 Anxiety disorder, unspecified: Secondary | ICD-10-CM

## 2018-08-12 DIAGNOSIS — Z720 Tobacco use: Secondary | ICD-10-CM

## 2018-08-12 DIAGNOSIS — Z0000 Encounter for general adult medical examination without abnormal findings: Secondary | ICD-10-CM

## 2018-08-12 MED ORDER — MELATONIN 1 MG PO TABS: 1.0000 | ORAL_TABLET | Freq: Every day | ORAL | 1 refills | Status: DC

## 2018-08-12 MED ORDER — FLUOXETINE HCL 20 MG PO TABS: 20.0000 mg | ORAL_TABLET | Freq: Every day | ORAL | 0 refills | Status: DC

## 2018-08-12 MED FILL — FLUoxetine HCL 20 MG TABS: 20 | 30 days supply | Qty: 30 | Fill #0

## 2018-08-13 ENCOUNTER — Other Ambulatory Visit: Payer: Self-pay | Admitting: *Deleted

## 2018-08-13 DIAGNOSIS — J449 Chronic obstructive pulmonary disease, unspecified: Secondary | ICD-10-CM

## 2018-08-14 LAB — CBC WITH DIFFERENTIAL/PLATELET
Basophils Absolute: 0.1 10*3/uL (ref 0.0–0.2)
Basos: 1 %
EOS (ABSOLUTE): 0.4 10*3/uL (ref 0.0–0.4)
Eos: 5 %
Hematocrit: 44.7 % (ref 34.0–46.6)
Hemoglobin: 16 g/dL — ABNORMAL HIGH (ref 11.1–15.9)
Immature Grans (Abs): 0 10*3/uL (ref 0.0–0.1)
Immature Granulocytes: 0 %
Lymphocytes Absolute: 2.7 10*3/uL (ref 0.7–3.1)
Lymphs: 31 %
MCH: 32.7 pg (ref 26.6–33.0)
MCHC: 35.8 g/dL — ABNORMAL HIGH (ref 31.5–35.7)
MCV: 91 fL (ref 79–97)
Monocytes Absolute: 0.8 10*3/uL (ref 0.1–0.9)
Monocytes: 9 %
Neutrophils Absolute: 4.9 10*3/uL (ref 1.4–7.0)
Neutrophils: 54 %
Platelets: 223 10*3/uL (ref 150–450)
RBC: 4.89 x10E6/uL (ref 3.77–5.28)
RDW: 13 % (ref 11.7–15.4)
WBC: 8.9 10*3/uL (ref 3.4–10.8)

## 2018-08-14 NOTE — Progress Notes (Signed)
  [facility] Internal Medicine Residency Telephone Encounter Continuity Care Appointment  HPI:   This telephone encounter was created for Ms. Patricia Farrell on 08/12/2018 for the following purpose/cc depression.   Past Medical History:  Past Medical History:  Diagnosis Date  . Acute bronchopneumonia 04/17/2012  . Candida infection, esophageal (HCC) 04/30/2012   Possible candidal esophagitis   . COPD (chronic obstructive pulmonary disease) (HCC) 04/19/2012  . Depression   . Endometriosis   . GERD (gastroesophageal reflux disease)   . Normocytic anemia 01/12/2013  . Strain of right trapezius muscle 03/11/2014      ROS:  Review of Systems  Constitutional: Negative for chills and fever.  Respiratory: Negative for cough, shortness of breath and wheezing.   Cardiovascular: Negative for chest pain.  Psychiatric/Behavioral: Positive for depression. Negative for suicidal ideas.     Assessment / Plan / Recommendations:   Please see A&P under problem oriented charting for assessment of the patient's acute and chronic medical conditions.   As always, pt is advised that if symptoms worsen or new symptoms arise, they should go to an urgent care facility or to to ER for further evaluation.   Consent and Medical Decision Making:   Patient discussed with Dr. Criselda Peaches  This is a telephone encounter between Charma Igo and Aldean Baker on 08/12/2018 for depression. The visit was conducted with the patient located at home and Aldean Baker at [facility]. The patient's identity was confirmed using their DOB. The patient has consented to being evaluated through a telephone encounter and understands the associated risks (an examination cannot be done and the patient may need to come in for an appointment) / benefits (allows the patient to remain at home, decreasing exposure to coronavirus). I personally spent 17 minutes on medical discussion.

## 2018-08-14 NOTE — Assessment & Plan Note (Addendum)
HPI:  Patient uses anoro ellipta inhaler daily.  She denies any copd exacerbations since last visit 6 months ago.  She states she currently smokes  Assessement: COPD Patient is doing well on laba/lama once daily inhaler.  Discussed smoking cessation however patient states she is not ready to quit.  Plan -continue anoro ellipta -rediscuss smoking cessation at next visit

## 2018-08-14 NOTE — Assessment & Plan Note (Signed)
Assessment:  Preventative health care Patient is due for mammogram and colonoscopy.  Offered referral and patient declined due to lack of insurance.  Plan -reassess at next visit -encouraged renewing [facility]

## 2018-08-14 NOTE — Assessment & Plan Note (Signed)
HPI:  Patient states she has been taking celexa but no longer feels that it is working.  She has tried her daughters prozac with some benefit and states she wants to switch to prozac.  She states that the pandemic has caused her a lot of stress and is also having trouble sleeping.  Assessment:  Major depressive disorder and anxiety  PHQ-9 was 7 today.  Will try prozac.  For sleep will try melatonin as patient states she has not tried anything in the past.  Offered referral to our behavioral therapist however patient declined at this time.   Plan -start prozac -melatonin -if melatonin is not helpful can consider trazodone or ramelteon

## 2018-08-17 ENCOUNTER — Other Ambulatory Visit: Payer: Self-pay

## 2018-08-18 ENCOUNTER — Telehealth: Payer: Self-pay

## 2018-08-18 DIAGNOSIS — J449 Chronic obstructive pulmonary disease, unspecified: Secondary | ICD-10-CM

## 2018-08-18 NOTE — Progress Notes (Signed)
Internal Medicine Clinic Attending  Case discussed with Dr. Hoffman soon after the resident saw the patient.  We reviewed the resident's history and telephone conversation and pertinent patient test results.  I agree with the assessment, diagnosis, and plan of care documented in the resident's note.   

## 2018-08-18 NOTE — Telephone Encounter (Addendum)
Patricia Farrell is a 53 y.o. female reports to clinical pharmacist appointment for COPD medication management. Patient did not bring medication.   Patient's current COPD medication regimen consists of: Anoro and albuterol   Patient denies COPD related symptoms.   Patient reports use of rescue inhaler with an estimated use at about 7  times per week.   Patient denies occurrence of a COPD exacerbation since last appointment.   Patient reports a history of smoking with a current pack-per year of 1PPD.  No Known Allergies Medication Sig  albuterol (PROVENTIL HFA) 108 (90 Base) MCG/ACT inhaler Inhale 1-2 puffs into the lungs every 6 (six) hours as needed for wheezing or shortness of breath.  aspirin EC 81 MG tablet Take 81 mg by mouth daily.    calcium carbonate (OS-CAL - DOSED IN MG OF ELEMENTAL CALCIUM) 1250 MG tablet Take 1 tablet by mouth daily.    Cyanocobalamin (VITAMIN B-12 CR PO) Take 1 tablet by mouth daily.  fish oil-omega-3 fatty acids 1000 MG capsule Take 2 g by mouth daily.    FLUoxetine (PROZAC) 20 MG tablet Take 1 tablet (20 mg total) by mouth daily.  Melatonin 1 MG TABS Take 1 tablet (1 mg total) by mouth at bedtime.  Multiple Vitamins-Minerals (MULTIVITAMINS THER. W/MINERALS) TABS Take 1 tablet by mouth daily.    nicotine (NICODERM CQ - DOSED IN MG/24 HOURS) 14 mg/24hr patch Place 1 patch (14 mg total) onto the skin daily.  nystatin (MYCOSTATIN/NYSTOP) powder Apply topically 4 (four) times daily.  omeprazole (PRILOSEC) 40 MG capsule Take 1 capsule (40 mg total) by mouth daily. IM program  umeclidinium-vilanterol (ANORO ELLIPTA) 62.5-25 MCG/INH AEPB Inhale 1 puff into the lungs daily.   Past Medical History:  Diagnosis Date  . Acute bronchopneumonia 04/17/2012  . Candida infection, esophageal (HCC) 04/30/2012   Possible candidal esophagitis   . COPD (chronic obstructive pulmonary disease) (HCC) 04/19/2012  . Depression   . Endometriosis   . GERD (gastroesophageal reflux  disease)   . Normocytic anemia 01/12/2013  . Strain of right trapezius muscle 03/11/2014   Social History   Socioeconomic History  . Marital status: Married    Spouse name: Not on file  . Number of children: Not on file  . Years of education: Not on file  . Highest education level: Not on file  Occupational History  . Not on file  Social Needs  . Financial resource strain: Not on file  . Food insecurity:    Worry: Not on file    Inability: Not on file  . Transportation needs:    Medical: Not on file    Non-medical: Not on file  Tobacco Use  . Smoking status: Current Every Day Smoker    Packs/day: 0.50    Years: 36.00    Pack years: 18.00    Types: Cigarettes, E-cigarettes    Last attempt to quit: 04/13/2012    Years since quitting: 6.3  . Smokeless tobacco: Never Used  . Tobacco comment: 02/20/2016 "use e-cigarettes off and on"  Substance and Sexual Activity  . Alcohol use: Yes    Alcohol/week: 6.0 standard drinks    Types: 4 Glasses of wine, 2 Cans of beer per week    Comment: 02/20/2016  Endorses prior heavy use.   . Drug use: No    Comment: Patient daughter in room and patient distressed. Will discuss drug use at later evaluation.  . Sexual activity: Yes    Birth control/protection: None, Surgical  Lifestyle  .  Physical activity:    Days per week: Not on file    Minutes per session: Not on file  . Stress: Not on file  Relationships  . Social connections:    Talks on phone: Not on file    Gets together: Not on file    Attends religious service: Not on file    Active member of club or organization: Not on file    Attends meetings of clubs or organizations: Not on file    Relationship status: Not on file  Other Topics Concern  . Not on file  Social History Narrative   Works at [employer].    Family History  Problem Relation Age of Onset  . Diabetes Sister   . Diabetes Brother     O: Ht Readings from Last 2 Encounters:  04/30/17 5\' 3"  (1.6 m)   03/04/16 5\' 3"  (1.6 m)   Wt Readings from Last 2 Encounters:  04/30/17 172 lb 11.2 oz (78.3 kg)  03/04/16 173 lb 6.4 oz (78.7 kg)   There is no height or weight on file to calculate BMI.  CAT ASSESSMENT  Rank each of the following items on a scale of 0 to 5 (with 5 being most severe) Write a # 0-5 in each box  I never cough (0) > I cough all the time (5) 2  I have no phlegm (mucus) in my chest (0) > My chest is completely full of phlegm (mucus) (5) 2  My chest does not feel tight at all (0) > My chest feels very tight (5) 0  When I walk up a hill or one flight of stairs I am not breathless (0) > When I walk up a hill or one flight of stairs I am very breathless (5) 2  I am not limited doing any activities at home (0) > I am very limited doing activities at home (5) 4  I am confident leaving my home despite my lung function (0) > I am not at all confident leaving my home because of my lung condition (5)  0  I sleep soundly (0) > I don't sleep soundly because of my lung condition (5) 0  I have lots of energy (0) > I have no energy at all (5) 5    Total CAT Score: 15 Most recent blood eosinophil count was 400 cells/microL taken on 08/13/2018  .  A/P: . Since patient's symptoms have worsened compared to baseline (CAT score of 8) and eosinophils are elevated, will consider re-initiating inhaled corticosteroid. . Will consider Trelegy if patient's symptoms continue to worsen after her depression is well-controlled.   . Counseled patient to report any changes in COPD medications, breathing, or shortness of breath.   An after visit summary was provided and patient advised to follow up in two weeks or sooner if any changes in condition or questions regarding medications arise.

## 2018-09-01 ENCOUNTER — Telehealth: Payer: Self-pay

## 2018-09-01 DIAGNOSIS — F330 Major depressive disorder, recurrent, mild: Secondary | ICD-10-CM

## 2018-09-17 MED ORDER — FLUOXETINE HCL 20 MG PO TABS: 20.0000 mg | ORAL_TABLET | Freq: Every day | ORAL | 0 refills | Status: DC

## 2018-09-17 NOTE — Progress Notes (Signed)
Patient was approved for  [facility]. Prescription for fluoxetine transferred over.

## 2018-09-17 NOTE — Addendum Note (Signed)
Addended by: Forde Dandy on: 09/17/2018 12:14 PM   Modules accepted: Orders

## 2018-09-29 ENCOUNTER — Telehealth: Payer: Self-pay | Admitting: Pharmacist

## 2018-10-04 ENCOUNTER — Encounter: Payer: Self-pay | Admitting: *Deleted

## 2018-10-05 NOTE — Progress Notes (Signed)
Patricia Farrell is a 53 y.o. female was contacted for COPD medication management.  Patient's current COPD medication regimen consists of: Anoro and PRN albuterol, patient correctly reports how to use each inhaler  Patient denies COPD related symptoms. Patient reports use of rescue inhaler with an estimated use at about 5 times per week. Patient denies occurrence of a COPD exacerbation since last appointment.   Patient reports a history of smoking 1/2 pack per day.  No Known Allergies  Current Outpatient Medications:  .  umeclidinium-vilanterol (ANORO ELLIPTA) 62.5-25 MCG/INH AEPB, Inhale 1 puff into the lungs daily., Disp: , Rfl:  .  albuterol (PROVENTIL HFA) 108 (90 Base) MCG/ACT inhaler, Inhale 1-2 puffs into the lungs every 6 (six) hours as needed for wheezing or shortness of breath., Disp: 1 Inhaler, Rfl: 3 .  aspirin EC 81 MG tablet, Take 81 mg by mouth daily.  , Disp: , Rfl:  .  calcium carbonate (OS-CAL - DOSED IN MG OF ELEMENTAL CALCIUM) 1250 MG tablet, Take 1 tablet by mouth daily.  , Disp: , Rfl:  .  Cyanocobalamin (VITAMIN B-12 CR PO), Take 1 tablet by mouth daily., Disp: , Rfl:  .  fish oil-omega-3 fatty acids 1000 MG capsule, Take 2 g by mouth daily.  , Disp: , Rfl:  .  FLUoxetine (PROZAC) 20 MG tablet, Take 1 tablet (20 mg total) by mouth daily., Disp: 90 tablet, Rfl: 0 .  Melatonin 1 MG TABS, Take 1 tablet (1 mg total) by mouth at bedtime., Disp: 30 each, Rfl: 1 .  Multiple Vitamins-Minerals (MULTIVITAMINS THER. W/MINERALS) TABS, Take 1 tablet by mouth daily.  , Disp: , Rfl:  .  nicotine (NICODERM CQ - DOSED IN MG/24 HOURS) 14 mg/24hr patch, Place 1 patch (14 mg total) onto the skin daily., Disp: 28 patch, Rfl: 0 .  nystatin (MYCOSTATIN/NYSTOP) powder, Apply topically 4 (four) times daily., Disp: 15 g, Rfl: 0 .  omeprazole (PRILOSEC) 40 MG capsule, Take 1 capsule (40 mg total) by mouth daily. IM program, Disp: 30 capsule, Rfl: 1 Past Medical History:  Diagnosis Date  . Acute  bronchopneumonia 04/17/2012  . Candida infection, esophageal ([place]) 04/30/2012   Possible candidal esophagitis   . COPD (chronic obstructive pulmonary disease) ([place]) 04/19/2012  . Depression   . Endometriosis   . GERD (gastroesophageal reflux disease)   . Normocytic anemia 01/12/2013  . Strain of right trapezius muscle 03/11/2014   Social History   Socioeconomic History  . Marital status: Married    Spouse name: Not on file  . Number of children: Not on file  . Years of education: Not on file  . Highest education level: Not on file  Occupational History  . Not on file  Social Needs  . Financial resource strain: Not on file  . Food insecurity    Worry: Not on file    Inability: Not on file  . Transportation needs    Medical: Not on file    Non-medical: Not on file  Tobacco Use  . Smoking status: Current Every Day Smoker    Packs/day: 0.50    Years: 36.00    Pack years: 18.00    Types: Cigarettes, E-cigarettes    Last attempt to quit: 04/13/2012    Years since quitting: 6.4  . Smokeless tobacco: Never Used  . Tobacco comment: 02/20/2016 "use e-cigarettes off and on"  Substance and Sexual Activity  . Alcohol use: Yes    Alcohol/week: 6.0 standard drinks    Types: 4  Glasses of wine, 2 Cans of beer per week    Comment: 02/20/2016  Endorses prior heavy use.   . Drug use: No    Comment: Patient daughter in room and patient distressed. Will discuss drug use at later evaluation.  . Sexual activity: Yes    Birth control/protection: None, Surgical  Lifestyle  . Physical activity    Days per week: Not on file    Minutes per session: Not on file  . Stress: Not on file  Relationships  . Social Musicianconnections    Talks on phone: Not on file    Gets together: Not on file    Attends religious service: Not on file    Active member of club or organization: Not on file    Attends meetings of clubs or organizations: Not on file    Relationship status: Not on file  Other Topics Concern  .  Not on file  Social History Narrative   Works at [employer].    Family History  Problem Relation Age of Onset  . Diabetes Sister   . Diabetes Brother     O: Ht Readings from Last 2 Encounters:  04/30/17 5\' 3"  (1.6 m)  03/04/16 5\' 3"  (1.6 m)   Wt Readings from Last 2 Encounters:  04/30/17 172 lb 11.2 oz (78.3 kg)  03/04/16 173 lb 6.4 oz (78.7 kg)   There is no height or weight on file to calculate BMI.  CAT ASSESSMENT  Rank each of the following items on a scale of 0 to 5 (with 5 being most severe) Write a # 0-5 in each box  I never cough (0) > I cough all the time (5) 2  I have no phlegm (mucus) in my chest (0) > My chest is completely full of phlegm (mucus) (5) 2  My chest does not feel tight at all (0) > My chest feels very tight (5) 0  When I walk up a hill or one flight of stairs I am not breathless (0) > When I walk up a hill or one flight of stairs I am very breathless (5) 2  I am not limited doing any activities at home (0) > I am very limited doing activities at home (5) 2  I am confident leaving my home despite my lung function (0) > I am not at all confident leaving my home because of my lung condition (5)  2  I sleep soundly (0) > I don't sleep soundly because of my lung condition (5) 0  I have lots of energy (0) > I have no energy at all (5) 3   Total CAT Score: 15 Most recent blood eosinophil count was 400 on 08/13/2018  A/P: Findings/Recommendations . Switch patient from Anoro to Trelegy due to elevated eosinophils, CAT score, and continued smoking . Patient states she is continuing to work on smoking cessation, has made some progress, does not need assistance at this time. . Counseled patient to report any changes in COPD medications, breathing, or shortness of breath.  . Follow up in 1 month.  Patient was advised to follow up if any changes in condition or questions regarding medications arise.   The patient verbalized understanding of information  provided by repeating back concepts discussed.

## 2018-10-07 ENCOUNTER — Other Ambulatory Visit: Payer: Self-pay | Admitting: Pharmacist

## 2018-10-07 DIAGNOSIS — J449 Chronic obstructive pulmonary disease, unspecified: Secondary | ICD-10-CM

## 2018-10-07 MED ORDER — TRELEGY ELLIPTA 100-62.5-25 MCG/INH IN AEPB: 1.0000 | INHALATION_SPRAY | Freq: Every day | RESPIRATORY_TRACT | 3 refills | Status: DC

## 2018-10-07 NOTE — Progress Notes (Signed)
Trelegy prescription sent to patient assistance program

## 2019-01-26 ENCOUNTER — Telehealth: Payer: Self-pay | Admitting: Pharmacist

## 2019-01-26 DIAGNOSIS — F330 Major depressive disorder, recurrent, mild: Secondary | ICD-10-CM

## 2019-01-26 MED ORDER — FLUOXETINE HCL 20 MG PO TABS: 20.0000 mg | ORAL_TABLET | Freq: Every day | ORAL | 0 refills | Status: DC

## 2019-02-14 ENCOUNTER — Encounter: Payer: Self-pay | Admitting: Internal Medicine

## 2019-09-15 ENCOUNTER — Ambulatory Visit: Payer: Self-pay | Admitting: Internal Medicine

## 2019-09-15 ENCOUNTER — Encounter: Payer: Self-pay | Admitting: Internal Medicine

## 2019-09-15 ENCOUNTER — Other Ambulatory Visit: Payer: Self-pay

## 2019-09-15 VITALS — BP 115/100 | HR 93 | Temp 97.8°F | Ht 64.0 in | Wt 152.6 lb

## 2019-09-15 DIAGNOSIS — K219 Gastro-esophageal reflux disease without esophagitis: Secondary | ICD-10-CM

## 2019-09-15 DIAGNOSIS — J449 Chronic obstructive pulmonary disease, unspecified: Secondary | ICD-10-CM

## 2019-09-15 DIAGNOSIS — K5900 Constipation, unspecified: Secondary | ICD-10-CM

## 2019-09-15 DIAGNOSIS — F329 Major depressive disorder, single episode, unspecified: Secondary | ICD-10-CM

## 2019-09-15 DIAGNOSIS — F330 Major depressive disorder, recurrent, mild: Secondary | ICD-10-CM

## 2019-09-15 LAB — COMPREHENSIVE METABOLIC PANEL
ALT: 18 U/L (ref 0–44)
AST: 18 U/L (ref 15–41)
Albumin: 3.4 g/dL — ABNORMAL LOW (ref 3.5–5.0)
Alkaline Phosphatase: 66 U/L (ref 38–126)
Anion gap: 6 (ref 5–15)
BUN: 10 mg/dL (ref 6–20)
CO2: 29 mmol/L (ref 22–32)
Calcium: 9.1 mg/dL (ref 8.9–10.3)
Chloride: 103 mmol/L (ref 98–111)
Creatinine, Ser: 0.78 mg/dL (ref 0.44–1.00)
GFR calc Af Amer: >60 mL/min (ref >60)
GFR calc non Af Amer: >60 mL/min (ref >60)
Glucose, Bld: 84 mg/dL (ref 70–99)
Potassium: 4.2 mmol/L (ref 3.5–5.1)
Sodium: 138 mmol/L (ref 135–145)
Total Bilirubin: 0.2 mg/dL — ABNORMAL LOW (ref 0.3–1.2)
Total Protein: 6.3 g/dL — ABNORMAL LOW (ref 6.5–8.1)

## 2019-09-15 MED ORDER — OMEPRAZOLE 40 MG PO CPDR: 40.0000 mg | DELAYED_RELEASE_CAPSULE | Freq: Every day | ORAL | 3 refills | Status: DC

## 2019-09-15 MED ORDER — SERTRALINE HCL 25 MG PO TABS: 25.0000 mg | ORAL_TABLET | Freq: Every day | ORAL | 2 refills | Status: DC

## 2019-09-15 MED ORDER — LACTULOSE 20 G PO PACK: 20.0000 g | PACK | Freq: Three times a day (TID) | ORAL | 0 refills | Status: DC

## 2019-09-15 MED ORDER — MAGNESIUM CITRATE PO SOLN: 0.5000 | Freq: Every day | ORAL | 3 refills | Status: DC | PRN

## 2019-09-15 MED ORDER — TRELEGY ELLIPTA 100-62.5-25 MCG/INH IN AEPB: 1.0000 | INHALATION_SPRAY | Freq: Every day | RESPIRATORY_TRACT | 3 refills | Status: DC

## 2019-09-15 MED ORDER — ALBUTEROL SULFATE HFA 108 (90 BASE) MCG/ACT IN AERS: 1.0000 | INHALATION_SPRAY | Freq: Four times a day (QID) | RESPIRATORY_TRACT | 3 refills | Status: DC | PRN

## 2019-09-15 NOTE — Progress Notes (Signed)
° °  CC: Constipation   HPI: [name] is a 54 y.o. with a hx as noted below who presents for constipation. Please refer to the problem based charting for further details   Miralax, purne juice, "IB pills", enema last night- small BM, and small BM 2-3 hours ago  Past Medical History:  Diagnosis Date   Acute bronchopneumonia 04/17/2012   Candida infection, esophageal ([facility]) 04/30/2012   Possible candidal esophagitis    COPD (chronic obstructive pulmonary disease) ([facility]) 04/19/2012   Depression    Endometriosis    GERD (gastroesophageal reflux disease)    Normocytic anemia 01/12/2013   Strain of right trapezius muscle 03/11/2014   Review of Systems:  All systems reviewed and are negative unless mentioned in the HPI  Physical Exam: Vitals:   09/15/19 1600  BP: (!) 115/100  Pulse: 93  Temp: 97.8 F (36.6 C)  TempSrc: Oral  SpO2: 95%  Weight: 152 lb 9.6 oz (69.2 kg)  Height: 5\' 4"  (1.626 m)   Physical Exam Vitals reviewed.  Constitutional:      General: She is not in acute distress.    Appearance: Normal appearance. She is obese. She is not toxic-appearing or diaphoretic.     Comments: Restless, constantly fidgeting and rocking back in forth in seat  HENT:     Head: Normocephalic and atraumatic.  Cardiovascular:     Rate and Rhythm: Normal rate and regular rhythm.     Pulses: Normal pulses.     Heart sounds: Normal heart sounds. No murmur heard.  No friction rub. No gallop.   Pulmonary:     Effort: Pulmonary effort is normal. No respiratory distress.     Breath sounds: Wheezing present. No rales.  Abdominal:     General: Abdomen is flat. Bowel sounds are normal. There is no distension.     Palpations: Abdomen is soft.     Tenderness: There is no abdominal tenderness. There is no guarding.  Musculoskeletal:     Right lower leg: No edema.     Left lower leg: No edema.  Skin:    General: Skin is warm.  Neurological:     General: No focal deficit present.       Mental Status: She is alert and oriented to person, place, and time.  Psychiatric:        Mood and Affect: Mood normal.    Assessment & Plan:   See Encounters Tab for problem based charting.  Patient discussed with Dr. 

## 2019-09-15 NOTE — Patient Instructions (Addendum)
Thank you for visiting [place] in clinic today.  Below is a summary of what we discussed:  1.  Constipation -Start taking lactulose and magnesium citrate.  These medications should help you have a bowel movement. -I would like you to start taking MiraLAX and Senokot daily to regulate your bowels.  Uptitrate the MiraLAX as needed until you are having at least 1 bowel movement a day.  2.  Depression -Start Zoloft 25 mg daily -Stop taking Prozac -I provided referrals to mental health therapist and social work to help get some resources to help you  3.  Medication refills -I refilled several of your inhaler medications and reflux medication.  Pick this up at the pharmacy.  4.  Follow-up -Please schedule follow-up visit in 4 weeks so we can establish a primary care doctor for you  If you have any questions or concerns, please feel free to reach out to [place].

## 2019-09-16 ENCOUNTER — Telehealth: Payer: Self-pay | Admitting: Dentistry

## 2019-09-16 ENCOUNTER — Encounter: Payer: Self-pay | Admitting: Internal Medicine

## 2019-09-16 DIAGNOSIS — K5900 Constipation, unspecified: Secondary | ICD-10-CM | POA: Insufficient documentation

## 2019-09-16 MED ORDER — ALBUTEROL SULFATE HFA 108 (90 BASE) MCG/ACT IN AERS: 1.0000 | INHALATION_SPRAY | Freq: Four times a day (QID) | RESPIRATORY_TRACT | 3 refills | Status: DC | PRN

## 2019-09-16 MED ORDER — BUDESONIDE-FORMOTEROL FUMARATE 160-4.5 MCG/ACT IN AERO: 2.0000 | INHALATION_SPRAY | Freq: Every day | RESPIRATORY_TRACT | 12 refills | Status: DC

## 2019-09-16 NOTE — Assessment & Plan Note (Signed)
Pt continues to smoke PPD of cigarettes. She has COPD and reports that she doesn't have her inhalers. Unfortunately, the pt is uninsured and hasn't been seen in clinic for over a year. She doesn't have any of her prescribed inhalers at home, and her uninsured status makes our choices more narrowed. Pt has been on symbicort and albuterol in the past  Plan:  -Symbicort 2 puffs daily ordered -Albuterol 1-2 puffs as needed every 6 hours ordered

## 2019-09-16 NOTE — Assessment & Plan Note (Signed)
Patient states that she has been taking Prozac, but the pills have been irritating her throat.  She does not want to take this medication anymore and wants to switch to a new medication.  We discussed several different medications, and the patient would like to try Zoloft.  Additionally, the patient is under a lot of stress as her fianc has lung cancer and they are currently experiencing an unstable housing situation.  I think she would benefit from sessions with a behavioral therapist and consult to CCM for resources.  The patient is amendable at this time.  Plan: -Stop Prozac -Zoloft 25 mg daily ordered -Referral to behavioral therapist, Lysle Rubens -Referral to chronic care management

## 2019-09-16 NOTE — Assessment & Plan Note (Signed)
Patient states that she has been unable to have a bowel movement for the past 5 days.  She states that she has tried MiraLAX, prune juice, "IB pills", and an enema.  She states that she had a very small bowel movement after the enema.  She is not nauseous or vomiting, and is tolerating a diet.  She has no tenderness to palpation on exam and her abdomen is soft.  Plan: -Lactulose 20 mg 3 times daily -Half bottle magnesium citrate ordered -Patient was counseled on starting MiraLAX and Senokot tablets daily once she is able to have a bowel movement.  She was instructed to uptitrate her MiraLAX until she is having at least 1 bowel movement a day. -Follow-up in 4 weeks

## 2019-09-16 NOTE — Telephone Encounter (Signed)
Requesting call back for test results.

## 2019-09-19 NOTE — Telephone Encounter (Signed)
I just spoke to the pt updating her on her blood test results from her previous visit. Pt complaining of lower back pain and encouraged to schedule another appointment to address it.   Kirt Boys, MD Internal Medicine, PGY1 Pager: [phone]  09/19/2019,10:23 AM

## 2019-09-20 NOTE — Telephone Encounter (Signed)
Called patient and she declined an appointment until she is able to get her finances in order. Patient state will call us back at a later date.

## 2019-09-20 NOTE — Telephone Encounter (Signed)
Per dr Lyn Hollingshead pt needs appt

## 2019-09-21 ENCOUNTER — Telehealth: Payer: Self-pay | Admitting: Licensed Clinical Social Worker

## 2019-09-21 NOTE — Telephone Encounter (Signed)
Patient was called to discuss a referral for services. Patient agreed, and will be added to my schedule for 7/7 @ 12:30 via phone session.

## 2019-09-26 ENCOUNTER — Ambulatory Visit: Payer: Self-pay

## 2019-09-26 NOTE — Chronic Care Management (AMB) (Signed)
  Chronic Care Management   Outreach Note  09/26/2019 Name: Patricia Farrell MRN: [id number] DOB: 06-Dec-1965  Referred by: No primary care provider on file. Reason for referral : Care Coordination (SDOH Screening)   An unsuccessful telephone outreach was attempted today. The patient was referred to the case management team for assistance with care management and care coordination.   Follow Up Plan: The care management team will reach out to the patient again over the next 7 days.      Malachy Chamber, BSW Embedded Care Coordination Social Worker [facility] [phone]

## 2019-09-26 NOTE — Progress Notes (Signed)
Internal Medicine Clinic Attending  Case discussed with Dr. Alexander at the time of the visit.  We reviewed the resident's history and exam and pertinent patient test results.  I agree with the assessment, diagnosis, and plan of care documented in the resident's note.  

## 2019-10-03 ENCOUNTER — Ambulatory Visit: Payer: Self-pay

## 2019-10-03 DIAGNOSIS — R7303 Prediabetes: Secondary | ICD-10-CM

## 2019-10-03 DIAGNOSIS — J449 Chronic obstructive pulmonary disease, unspecified: Secondary | ICD-10-CM

## 2019-10-03 NOTE — Chronic Care Management (AMB) (Signed)
°  Care Management   Social Work Note  10/03/2019 Name: SANTOS HARDWICK MRN: [id number] DOB: [birth year]  YERALDINE FORNEY is a 54 y.o. year old female who sees No primary care provider on file. for primary care. The Care Management team was consulted for assistance with Community resources, disease management.   Talked with patient today about CCM services and gained consent.   Reminded patient about upcoming financial counseling appointment on 10/17/19.      Follow Up Plan: Appointment scheduled for SW Assessment with client by phone on: 10/11/19     Malachy Chamber, BSW Embedded Care Coordination Social Worker [facility] [phone]

## 2019-10-11 ENCOUNTER — Ambulatory Visit: Payer: Self-pay

## 2019-10-11 ENCOUNTER — Telehealth: Payer: Self-pay

## 2019-10-11 NOTE — Chronic Care Management (AMB) (Signed)
  Chronic Care Management   Outreach Note  10/11/2019 Name: Patricia Farrell MRN: [id number] DOB: 09/13/1965  Referred by: No primary care provider on file. Reason for referral : Care Coordination Patricia Farrell resources, disease management)   Attempted to contact patient today for scheduled Social Work Assessment, however, she did not answer.  Could not leave message due to mailbox being full.    Follow Up Plan: Patient scheduled for Financial Counseling on 10/17/19 @ 2:00PM   Requested that Patricia Farrell meet with her and reschedule SW assessment.         Patricia Farrell, BSW Embedded Care Coordination Social Worker [facility] [phone]

## 2019-10-12 ENCOUNTER — Ambulatory Visit: Payer: Self-pay | Admitting: Licensed Clinical Social Worker

## 2019-10-17 ENCOUNTER — Telehealth: Payer: Self-pay

## 2019-10-17 ENCOUNTER — Ambulatory Visit: Payer: Self-pay | Admitting: *Deleted

## 2019-10-17 ENCOUNTER — Ambulatory Visit: Payer: Self-pay

## 2019-10-17 NOTE — Chronic Care Management (AMB) (Addendum)
  Care Management   Outreach Note  10/17/2019 Name: Patricia Farrell MRN: [id number] DOB: 06-08-1965  Referred by: No primary care provider on file. Reason for referral : Care Coordination (COPD, prediabetes)   Had planned to meet with patient to complete the initial intake after she met with the clinic financial counselor at 2:00 pm today; however patient was no show for the appointment. Attempted to reach patient via contact number; unable to leave message as recording stated voice mail box has not been set up yet.  Follow Up Plan: The care management team will reach out to the patient again over the next 7-14 days.   Kelli Churn RN, CCM, [facility] RN Care Manager [phone]

## 2019-10-19 ENCOUNTER — Ambulatory Visit: Payer: Self-pay

## 2019-10-19 ENCOUNTER — Encounter: Payer: Self-pay | Admitting: Student

## 2019-10-19 ENCOUNTER — Ambulatory Visit (INDEPENDENT_AMBULATORY_CARE_PROVIDER_SITE_OTHER): Payer: Self-pay | Admitting: Student

## 2019-10-19 DIAGNOSIS — M545 Low back pain, unspecified: Secondary | ICD-10-CM

## 2019-10-19 DIAGNOSIS — Z720 Tobacco use: Secondary | ICD-10-CM

## 2019-10-19 DIAGNOSIS — M549 Dorsalgia, unspecified: Secondary | ICD-10-CM | POA: Insufficient documentation

## 2019-10-19 MED ORDER — CYCLOBENZAPRINE HCL 5 MG PO TABS: 5.0000 mg | ORAL_TABLET | Freq: Three times a day (TID) | ORAL | 0 refills | Status: AC | PRN

## 2019-10-19 MED FILL — CYCLOBENZAPRINE HCL 5 MG TA: 5 | 15 days supply | Qty: 30 | Fill #0

## 2019-10-19 NOTE — Assessment & Plan Note (Signed)
Patient continues to smoke half a pack a day but is trying to cut down. Encouraged patient to continue to try to stop smoking.

## 2019-10-19 NOTE — Patient Instructions (Addendum)
It was a pleasure seeing you in clinic.   Today we discussed back pain. I have prescribed a muscle relaxer to help with pain in addiction to ibuprofen, please avoid driving and alcohol while on this medication. You can continue to use ibuprofen every 12 hours as needed for pain. If pain worsens or changes please return to clinic.   If you have any questions or concerns, please call our clinic at [phone] between 9am-5pm and after hours call [phone] and ask for the internal medicine resident on call. If you feel you are having a medical emergency please call 911.   Thank you, we look forward to helping you remain healthy!

## 2019-10-19 NOTE — Assessment & Plan Note (Addendum)
Patient presenting for 1 week of gradually worsening right sided lower back pain that radiates down right leg to the knee. Notes some numbness from spinal stenosis that no different from usual. 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 She has been taking ibuprofen 800 mg every 4 to 6 hours for pain with improvement. She denies fever, chills, cough, numbness, tingling, urinary changes. She does recall any inciting event or injury that may have caused her pain. On exam patient pain on palpation of right lower back. We discussed conservative measures for back pain and limiting use of NSAIDs to avoid adverse GI and renal affects. Noted patient with about 10 lb weight loss in the last month, patient reports eating less due to heat of summer but has not been trying to lose weight. Given her weight loss and tobacco history she is at increased risk of malignancy, will reevaluate for malignancy if pain persists, worsens or changes.   Plan -Flexeril 5 mg BID for back pain -Ibuprofen as needed -ice/heat when necessary.

## 2019-10-19 NOTE — Progress Notes (Signed)
   CC: Back pain  HPI:  [name] is a 54 y.o. with a history documented below presents for back pain starting one week ago. Please refer to problem based charting for further details and assessment and plan of current problem and chronic medical conditions.   Past Medical History:  Diagnosis Date  . Acute bronchopneumonia 04/17/2012  . Candida infection, esophageal ([facility]) 04/30/2012   Possible candidal esophagitis   . COPD (chronic obstructive pulmonary disease) ([facility]) 04/19/2012  . Depression   . Endometriosis   . GERD (gastroesophageal reflux disease)   . Normocytic anemia 01/12/2013  . Strain of right trapezius muscle 03/11/2014   Review of Systems: Negative as per HPI  Physical Exam:  Vitals:   10/19/19 1436  BP: 116/66  Pulse: 95  Temp: 98.1 F (36.7 C)  TempSrc: Oral  SpO2: 100%  Weight: 141 lb 6.4 oz (64.1 kg)  Height: 5\' 4"  (1.626 m)   Physical Exam  Constitutional: Appears thin. Restlessly shifting in chair due to back pain HENT:  Head: Normocephalic and atraumatic.  Eyes: Conjunctivae are normal.  Cardiovascular: Normal rate, regular rhythm and normal heart sounds.  Respiratory: Effort normal and breath sounds normal. No respiratory distress. No wheezes.  GI: Soft. Bowel sounds are normal. No distension. There is no tenderness.  Musculoskeletal: Pain with palpation of right lower back radiating down right leg to knee, no midline tenderness.  No edema.  Neurological: Antalgic gait. Is alert. 5/5 strength of lower extremities bilaterally. No focal deficients Skin: Not diaphoretic. No erythema.  Psychiatric: Normal mood and affect. Behavior is normal. Judgment and thought content normal.    Assessment & Plan:   See Encounters Tab for problem based charting.  Patient seen with Dr. 

## 2019-10-20 ENCOUNTER — Encounter: Payer: Self-pay | Admitting: Student

## 2019-10-20 NOTE — Progress Notes (Signed)
Internal Medicine Clinic Attending ? ?Case discussed with Dr. Liang  At the time of the visit.  We reviewed the resident?s history and exam and pertinent patient test results.  I agree with the assessment, diagnosis, and plan of care documented in the resident?s note. ? ?

## 2019-10-20 NOTE — Addendum Note (Signed)
Addended by: Erlinda Hong T on: 10/20/2019 09:58 AM   Modules accepted: Level of Service

## 2019-10-25 ENCOUNTER — Ambulatory Visit (INDEPENDENT_AMBULATORY_CARE_PROVIDER_SITE_OTHER): Payer: Self-pay | Admitting: Licensed Clinical Social Worker

## 2019-10-25 ENCOUNTER — Other Ambulatory Visit: Payer: Self-pay

## 2019-10-25 ENCOUNTER — Encounter: Payer: Self-pay | Admitting: Licensed Clinical Social Worker

## 2019-10-25 ENCOUNTER — Telehealth: Payer: Self-pay

## 2019-10-25 DIAGNOSIS — F330 Major depressive disorder, recurrent, mild: Secondary | ICD-10-CM

## 2019-10-25 NOTE — BH Specialist Note (Signed)
Integrated Behavioral Health Visit via Telemedicine (Telephone)  10/25/2019 Patricia Farrell [id number]   Session Start time: 9:30  Session End time: 10:00 Total time: 30 minutes  Referring Provider: Dr. Greig Castilla Type of Visit: Telephonic Patient location: Home [facility] Provider location: Office All persons participating in visit: Patient and [facility]  Confirmed patient's address: Yes  Confirmed patient's phone number: Yes  Any changes to demographics: No   Discussed confidentiality: Yes    The following statements were read to the patient and/or legal guardian that are established with the [facility] Provider.  "The purpose of this phone visit is to provide behavioral health care while limiting exposure to the coronavirus (COVID19).  There is a possibility of technology failure and discussed alternative modes of communication if that failure occurs."  "By engaging in this telephone visit, you consent to the provision of healthcare.  Additionally, you authorize for your insurance to be billed for the services provided during this telephone visit."   Patient and/or legal guardian consented to telephone visit: Yes   PRESENTING CONCERNS: Patient and/or family reports the following symptoms/concerns: housing issues, anxiety, physical health challenges, interpersonal issues, and depression. Duration of problem: ongoing for many years; Severity of problem: moderate  GOALS ADDRESSED: Patient will: 1.  Reduce symptoms of: anxiety, depression and stress  2.  Increase knowledge and/or ability of: coping skills, healthy habits and stress reduction  3.  Demonstrate ability to: Increase healthy adjustment to current life circumstances and Increase adequate support systems for patient/family  INTERVENTIONS: Interventions utilized:  Mindfulness or Relaxation Training, Brief CBT and Supportive Counseling Standardized Assessments completed: assessed for SI, HI, and  self-harm.  ASSESSMENT: Patient currently experiencing moderate levels of anxiety and depression. Patient is currently having to live with her daughter due to losing her housing recently. Patient is overwhelmed. Patient reported that she worries frequently about her son due to his life choices. Patient reported her son has stolen from her recently, and this has caused her sadness. Patient reported that she has "struggled' her entire life. Patient recently gained a job in housekeeping. 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 Patient has filed for disability.   Patient has a boyfriend, and she is currently not living with him due to living her housing with him. Patient identified this is a source of her sadness. Patient reported she is angry, and feels judged.   Patient may benefit from counseling.  PLAN: 1. Follow up with behavioral health clinician on : two to three weeks.   Lysle Rubens, [facility], LCAS

## 2019-10-26 ENCOUNTER — Ambulatory Visit: Payer: Self-pay | Admitting: *Deleted

## 2019-10-26 ENCOUNTER — Telehealth: Payer: Self-pay

## 2019-10-26 NOTE — Chronic Care Management (AMB) (Signed)
  Care Management   Outreach Note  10/26/2019 Name: [name] MRN: [id number] DOB: [birth year]  Referred by: Quincy Simmonds, MD Reason for referral : Care Coordination (COPD, prediabetes, back pain)   An unsuccessful telephone outreach was attempted today. The patient was referred to the case management team for assistance with care management and care coordination. Unable to leave message as recording on patient's contact number states "voice mailbox has not been set up yet."  Follow Up Plan: The care management team will reach out to the patient again over the next 7-14 days.   [name] RN, CCM, [facility] CCM Clinic RN Care Manager [phone]

## 2019-11-03 ENCOUNTER — Telehealth: Payer: Self-pay | Admitting: *Deleted

## 2019-11-03 ENCOUNTER — Telehealth: Payer: Self-pay

## 2019-11-03 NOTE — Telephone Encounter (Signed)
  Care Management   Outreach Note  11/03/2019 Name: Patricia Farrell MRN: [id number] DOB: 05-01-1965  Referred by: Quincy Simmonds, MD Reason for referral : COPD, depression   Third unsuccessful telephone outreach was attempted today. The patient was referred to the case management team for assistance with care management and care coordination. The patient's primary care provider has been notified of our unsuccessful attempts to make or maintain contact with the patient. The care management team is pleased to engage with this patient at any time in the future should he/she be interested in assistance from the care management team.   Follow Up Plan: No further follow up required: as unable to reach patient  [name] RN, CCM, CDCES CCM Clinic RN Care Manager [phone]

## 2019-11-15 ENCOUNTER — Ambulatory Visit: Payer: Self-pay | Admitting: Licensed Clinical Social Worker

## 2019-11-15 ENCOUNTER — Telehealth: Payer: Self-pay | Admitting: Licensed Clinical Social Worker

## 2019-11-15 NOTE — Telephone Encounter (Signed)
Patient was called for her scheduled visit. Patient could not complete this appointment due to gaining employment. Patient plans to call the office back to schedule on an off day.

## 2019-11-21 ENCOUNTER — Encounter (HOSPITAL_COMMUNITY): Payer: Self-pay | Admitting: Emergency Medicine

## 2019-11-21 ENCOUNTER — Emergency Department (HOSPITAL_COMMUNITY)
Admission: EM | Admit: 2019-11-21 | Discharge: 2019-11-21 | Disposition: A | Discharge status: Home / Self Care | Payer: Self-pay | Attending: Emergency Medicine | Admitting: Emergency Medicine

## 2019-11-21 ENCOUNTER — Other Ambulatory Visit: Payer: Self-pay

## 2019-11-21 DIAGNOSIS — F17210 Nicotine dependence, cigarettes, uncomplicated: Secondary | ICD-10-CM | POA: Insufficient documentation

## 2019-11-21 DIAGNOSIS — Z79899 Other long term (current) drug therapy: Secondary | ICD-10-CM | POA: Insufficient documentation

## 2019-11-21 DIAGNOSIS — J449 Chronic obstructive pulmonary disease, unspecified: Secondary | ICD-10-CM | POA: Insufficient documentation

## 2019-11-21 DIAGNOSIS — M79641 Pain in right hand: Secondary | ICD-10-CM | POA: Insufficient documentation

## 2019-11-21 DIAGNOSIS — Z7982 Long term (current) use of aspirin: Secondary | ICD-10-CM | POA: Insufficient documentation

## 2019-11-21 DIAGNOSIS — F17290 Nicotine dependence, other tobacco product, uncomplicated: Secondary | ICD-10-CM | POA: Insufficient documentation

## 2019-11-21 DIAGNOSIS — M79642 Pain in left hand: Secondary | ICD-10-CM | POA: Insufficient documentation

## 2019-11-21 NOTE — Discharge Instructions (Signed)
Please return for any problem.   Follow up with [facility] as instructed.  Use ace wrap or thumb spica velcro splint as instructed for pain.   Use tylenol and ibuprofen as instructed.   Apply ice as instructed.

## 2019-11-21 NOTE — ED Triage Notes (Signed)
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 Reports some are really tight and has to struggle to get them on the corners of the beds. Pt past 2 weeks having pains and swelling in bilat hands. Had to get her rings off due to swelling.

## 2019-11-21 NOTE — ED Provider Notes (Signed)
[facility]-EMERGENCY DEPT Provider Note   CSN: [id number] Arrival date & time: 11/21/19  5573     History Chief Complaint  Patient presents with  . Hand Pain  . hand swelling    Patricia Farrell is a 54 y.o. female.  54 year old female with prior medical history detailed below presents for evaluation of bilateral hand pain.  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  She reports that the sheets that she has to placed on the beds are very tight.  This requires excessive pulling.  She reports bilateral hand pain bilateral thumbs, index finger, and middle fingers.  Pain is worse after working.  She request a light duty note.  She is concerned that she may have strained the muscles in her hands.  She reports that she is primarily right-handed.  She denies other injury.  She denies fever.  She denies numbness or tingling in the hands.  The history is provided by the patient.  Hand Pain This is a new problem. The current episode started more than 1 week ago. The problem occurs constantly. The problem has not changed since onset.Pertinent negatives include no chest pain, no abdominal pain, no headaches and no shortness of breath. Nothing aggravates the symptoms. Nothing relieves the symptoms. She has tried nothing for the symptoms.       Past Medical History:  Diagnosis Date  . Acute bronchopneumonia 04/17/2012  . Candida infection, esophageal (HCC) 04/30/2012   Possible candidal esophagitis   . COPD (chronic obstructive pulmonary disease) (HCC) 04/19/2012  . Depression   . Endometriosis   . GERD (gastroesophageal reflux disease)   . Normocytic anemia 01/12/2013  . Strain of right trapezius muscle 03/11/2014    Patient Active Problem List   Diagnosis Date Noted  . Back pain 10/19/2019  . Constipation 09/16/2019  . Abdominal pain, epigastric 12/31/2015  . Pre-diabetes 03/11/2014  . Spinal stenosis of lumbar region 01/12/2013  .  Normocytic anemia 01/12/2013  . Preventative health care 05/17/2012  . Tobacco use 05/17/2012  . Occupational exposure in workplace 05/17/2012  . COPD (chronic obstructive pulmonary disease) (HCC) 04/19/2012  . Depression 04/17/2012    Past Surgical History:  Procedure Laterality Date  . ABDOMINAL HYSTERECTOMY  1991   "partial"  . CESAREAN SECTION  1983  . FOOT FRACTURE SURGERY Bilateral 2008  . FRACTURE SURGERY    . TUBAL LIGATION  1986     OB History    Gravida  3   Para  2   Term  2   Preterm      AB  1   Living  2     SAB      TAB      Ectopic  1   Multiple      Live Births              Family History  Problem Relation Age of Onset  . Diabetes Sister   . Diabetes Brother     Social History   Tobacco Use  . Smoking status: Current Every Day Smoker    Packs/day: 0.50    Years: 36.00    Pack years: 18.00    Types: Cigarettes, E-cigarettes    Last attempt to quit: 04/13/2012    Years since quitting: 7.6  . Smokeless tobacco: Never Used  . Tobacco comment: 02/20/2016 "use e-cigarettes off and on"  Substance Use Topics  . Alcohol use: Yes    Alcohol/week: 6.0 standard  drinks    Types: 4 Glasses of wine, 2 Cans of beer per week    Comment: 02/20/2016  Endorses prior heavy use.   . Drug use: No    Comment: Patient daughter in room and patient distressed. Will discuss drug use at later evaluation.    Home Medications Prior to Admission medications   Medication Sig Start Date End Date Taking? Authorizing Provider  albuterol (PROVENTIL HFA) 108 (90 Base) MCG/ACT inhaler Inhale 1-2 puffs into the lungs every 6 (six) hours as needed for wheezing or shortness of breath. 09/16/19   Kirt Boys, MD  aspirin EC 81 MG tablet Take 81 mg by mouth daily.      [provider]  budesonide-formoterol (SYMBICORT) 160-4.5 MCG/ACT inhaler Inhale 2 puffs into the lungs daily. 09/16/19   Kirt Boys, MD  calcium carbonate (OS-CAL - DOSED IN MG  OF ELEMENTAL CALCIUM) 1250 MG tablet Take 1 tablet by mouth daily.      [provider]  Cyanocobalamin (VITAMIN B-12 CR PO) Take 1 tablet by mouth daily.    [provider]  fish oil-omega-3 fatty acids 1000 MG capsule Take 2 g by mouth daily.      [provider]  lactulose (CEPHULAC) 20 g packet Take 1 packet (20 g total) by mouth 3 (three) times daily. 09/15/19   Kirt Boys, MD  magnesium citrate SOLN Take 148 mLs (0.5 Bottles total) by mouth daily as needed for mild constipation, moderate constipation or severe constipation. 09/15/19   Kirt Boys, MD  Melatonin 1 MG TABS Take 1 tablet (1 mg total) by mouth at bedtime. 08/12/18   Camelia Phenes, DO  Multiple Vitamins-Minerals (MULTIVITAMINS THER. W/MINERALS) TABS Take 1 tablet by mouth daily.      [provider]  nicotine (NICODERM CQ - DOSED IN MG/24 HOURS) 14 mg/24hr patch Place 1 patch (14 mg total) onto the skin daily. 03/04/16   Rivet, Iris Pert, MD  nystatin (MYCOSTATIN/NYSTOP) powder Apply topically 4 (four) times daily. 04/30/17   Geralyn Corwin Ratliff, DO  omeprazole (PRILOSEC) 40 MG capsule Take 1 capsule (40 mg total) by mouth daily. IM program 09/15/19 09/14/20  Kirt Boys, MD  sertraline (ZOLOFT) 25 MG tablet Take 1 tablet (25 mg total) by mouth daily. 09/15/19 09/14/20  Kirt Boys, MD    Allergies    Patient has no known allergies.  Review of Systems   Review of Systems  Respiratory: Negative for shortness of breath.   Cardiovascular: Negative for chest pain.  Gastrointestinal: Negative for abdominal pain.  Neurological: Negative for headaches.  All other systems reviewed and are negative.   Physical Exam Updated Vital Signs BP (!) 148/113 (BP Location: Right Arm)   Pulse 90   Temp 97.9 F (36.6 C) (Oral)   Resp 18   SpO2 96%   Physical Exam Vitals and nursing note reviewed.  Constitutional:      General: She is not in acute distress.     Appearance: She is well-developed.  HENT:     Head: Normocephalic and atraumatic.  Eyes:     Conjunctiva/sclera: Conjunctivae normal.     Pupils: Pupils are equal, round, and reactive to light.  Cardiovascular:     Rate and Rhythm: Normal rate and regular rhythm.     Heart sounds: Normal heart sounds.  Pulmonary:     Effort: Pulmonary effort is normal. No respiratory distress.     Breath sounds: Normal breath sounds.  Abdominal:     General:  There is no distension.     Palpations: Abdomen is soft.     Tenderness: There is no abdominal tenderness.  Musculoskeletal:        General: No swelling, tenderness, deformity or signs of injury. Normal range of motion.     Cervical back: Normal range of motion and neck supple.     Comments: Full AROM of bilateral hands   No appreciable erythema or edema  No loss of sensation noted   Skin:    General: Skin is warm and dry.  Neurological:     Mental Status: She is alert and oriented to person, place, and time.     ED Results / Procedures / Treatments   Labs (all labs ordered are listed, but only abnormal results are displayed) Labs Reviewed - No data to display  EKG None  Radiology No results found.  Procedures Procedures (including critical care time)  Medications Ordered in ED Medications - No data to display  ED Course  I have reviewed the triage vital signs and the nursing notes.  Pertinent labs & imaging results that were available during my care of the patient were reviewed by me and considered in my medical decision making (see chart for details).    MDM Rules/Calculators/A&P                           MDM  Screen complete  NAVI EWTON was evaluated in Emergency Department on 11/21/2019 for the symptoms described in the history of present illness. She was evaluated in the context of the global COVID-19 pandemic, which necessitated consideration that the patient might be at risk for infection with the  SARS-CoV-2 virus that causes COVID-19. Institutional protocols and algorithms that pertain to the evaluation of patients at risk for COVID-19 are in a state of rapid change based on information released by regulatory bodies including the CDC and federal and state organizations. These policies and algorithms were followed during the patient's care in the ED.   Patient is presenting with complaint of bilateral hand discomfort.  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   Patient advised to use rest, ice, and soft splints for comfort.  She is provided follow-up with orthopedics for further evaluation in the outpatient setting.  Importance of close follow-up is stressed.  Strict return precautions given and understood.  Final Clinical Impression(s) / ED Diagnoses Final diagnoses:  Bilateral hand pain    Rx / DC Orders ED Discharge Orders    None       Wynetta Fines, MD 11/21/19 1538

## 2019-12-02 MED FILL — SERTRALINE HCL 25 MG TABLET: 25 | 30 days supply | Qty: 30 | Fill #1

## 2020-01-11 ENCOUNTER — Ambulatory Visit: Payer: Self-pay

## 2020-01-11 NOTE — Chronic Care Management (AMB) (Signed)
CCM status changed to previously enrolled due to inability to maintain contact with pt.     Jolleen Seman, BSW Embedded Care Coordination Social Worker [facility] [phone]                             

## 2022-05-08 ENCOUNTER — Ambulatory Visit: Payer: Self-pay

## 2022-05-08 NOTE — Telephone Encounter (Signed)
  Chief Complaint: Mites - skin itching Symptoms: above Frequency: months Pertinent Negatives: Patient denies pain Disposition: [] ED /[] Urgent Care (no appt availability in office) / [x] Appointment(In office/virtual)/ []  [facility]/ [] Home Care/ [] Refused Recommended Disposition /[] [facility]/ []  Follow-up with PCP Additional Notes: Pt states that her home is filled with mites that bite her and her dog. Pt states the mites are very small. She is very itchy from the bites. She states an exterminator has come to the house, did not know what the insect was , but sprayed.  Pt states she has an appt with her PCP tomorrow. She states she found a nest of the mites under her refrigerator. Pt will try hydrocortisone cream and antihistamine tonight.      Reason for Disposition  [1] SEVERE local itching (i.e., interferes with work, school, sleep) AND [2] not improved after 24 hours of hydrocortisone cream  Answer Assessment - Initial Assessment Questions 1. TYPE of INSECT: "What type of insect was it?"      mites 2. ONSET: "When did you get bitten?"      A few months 3. LOCATION: "Where is the insect bite located?"      All over 4. REDNESS: "Is the area red or pink?" If Yes, ask: "What size is area of redness?" (inches or cm). "When did the redness start?"     no 5. PAIN: "Is there any pain?" If Yes, ask: "How bad is it?"  (Scale 1-10; or mild, moderate, severe)      6. ITCHING: "Does it itch?" If Yes, ask: "How bad is the itch?"    - MILD: doesn't interfere with normal activities   - MODERATE-SEVERE: interferes with work, school, sleep, or other activities      Moderate 7. SWELLING: "How big is the swelling?" (inches, cm, or compare to coins)     Hands are swollen, back is itching. 8. OTHER SYMPTOMS: "Do you have any other symptoms?"  (e.g., difficulty breathing, hives)     No - dogs are having difficulty breathing 9. PREGNANCY: "Is there any chance you are pregnant?"  "When was your last menstrual period?"  Protocols used: Insect Bite-A-AH

## 2022-10-24 ENCOUNTER — Emergency Department (HOSPITAL_BASED_OUTPATIENT_CLINIC_OR_DEPARTMENT_OTHER)
Admission: EM | Admit: 2022-10-24 | Discharge: 2022-10-24 | Disposition: A | Discharge status: Home / Self Care | Payer: Medicaid Other | Attending: Emergency Medicine | Admitting: Emergency Medicine

## 2022-10-24 ENCOUNTER — Encounter (HOSPITAL_BASED_OUTPATIENT_CLINIC_OR_DEPARTMENT_OTHER): Payer: Self-pay | Admitting: Emergency Medicine

## 2022-10-24 DIAGNOSIS — L299 Pruritus, unspecified: Secondary | ICD-10-CM | POA: Insufficient documentation

## 2022-10-24 DIAGNOSIS — Z7982 Long term (current) use of aspirin: Secondary | ICD-10-CM | POA: Insufficient documentation

## 2022-10-24 MED ORDER — PERMETHRIN 5 % EX CREA: TOPICAL_CREAM | CUTANEOUS | 1 refills | Status: DC

## 2022-10-24 NOTE — ED Triage Notes (Signed)
C/o itchy rash all over x 1 year. She thinks she has scabies.

## 2022-10-24 NOTE — ED Provider Notes (Signed)
[place] [facility]ERGENCY DEPARTMENT AT [facility] Provider Note   CSN: [id number] Arrival date & time: 10/24/22  7628     History  Chief Complaint  Patient presents with   Rash    Patricia Farrell is a 57 y.o. female. With a history of anxiety, depression, anemia who presents to the ED for evaluation of pruritus. She has had generalized itching for the past year. She moved into a family member's house just prior to symptom onset and was told by a previous tenant that they had scabies. She believes she has scabies. She was seen by her PCP 8 months ago and given permethrin cream but states this didn't help. She is also using hydroxyzine for the itching with minimal improvement. She denies any pain. No specific hand or foot pruritus. She believes her two dogs have scabies as well but has been unable to have them evaluated by a International aid/development worker.    Rash      Home Medications Prior to Admission medications   Medication Sig Start Date End Date Taking? Authorizing Provider  permethrin (ELIMITE) 5 % cream Apply to affected area once, leave on skin for 8 to 14 hours then rinse off, reapply in one week and follow the same process 10/24/22  Yes Emmanuella Mirante, Edsel Petrin, PA-C  albuterol (PROVENTIL HFA) 108 (90 Base) MCG/ACT inhaler Inhale 1-2 puffs into the lungs every 6 (six) hours as needed for wheezing or shortness of breath. 09/16/19   Kirt Boys, MD  aspirin EC 81 MG tablet Take 81 mg by mouth daily.      [provider]  budesonide-formoterol (SYMBICORT) 160-4.5 MCG/ACT inhaler Inhale 2 puffs into the lungs daily. 09/16/19   Kirt Boys, MD  calcium carbonate (OS-CAL - DOSED IN MG OF ELEMENTAL CALCIUM) 1250 MG tablet Take 1 tablet by mouth daily.      [provider]  Cyanocobalamin (VITAMIN B-12 CR PO) Take 1 tablet by mouth daily.    [provider]  fish oil-omega-3 fatty acids 1000 MG capsule Take 2 g by mouth daily.      [provider]   lactulose (CEPHULAC) 20 g packet Take 1 packet (20 g total) by mouth 3 (three) times daily. 09/15/19   Kirt Boys, MD  magnesium citrate SOLN Take 148 mLs (0.5 Bottles total) by mouth daily as needed for mild constipation, moderate constipation or severe constipation. 09/15/19   Kirt Boys, MD  Melatonin 1 MG TABS Take 1 tablet (1 mg total) by mouth at bedtime. 08/12/18   Camelia Phenes, DO  Multiple Vitamins-Minerals (MULTIVITAMINS THER. W/MINERALS) TABS Take 1 tablet by mouth daily.      [provider]  nicotine (NICODERM CQ - DOSED IN MG/24 HOURS) 14 mg/24hr patch Place 1 patch (14 mg total) onto the skin daily. 03/04/16   Rivet, Iris Pert, MD  nystatin (MYCOSTATIN/NYSTOP) powder Apply topically 4 (four) times daily. 04/30/17   Geralyn Corwin Ratliff, DO  omeprazole (PRILOSEC) 40 MG capsule Take 1 capsule (40 mg total) by mouth daily. IM program 09/15/19 09/14/20  Kirt Boys, MD  sertraline (ZOLOFT) 25 MG tablet Take 1 tablet (25 mg total) by mouth daily. 09/15/19 09/14/20  Kirt Boys, MD      Allergies    Patient has no known allergies.    Review of Systems   Review of Systems  Skin:  Positive for rash.  All other systems reviewed and are negative.   Physical Exam Updated Vital Signs BP 114/74 (BP Location:  Right Arm)   Pulse 91   Temp 97.8 F (36.6 C) (Oral)   Resp 16   SpO2 100%  Physical Exam Vitals and nursing note reviewed.  Constitutional:      General: She is not in acute distress.    Appearance: Normal appearance. She is normal weight. She is not ill-appearing.  HENT:     Head: Normocephalic and atraumatic.  Eyes:     General: No scleral icterus. Pulmonary:     Effort: Pulmonary effort is normal. No respiratory distress.  Abdominal:     General: Abdomen is flat.  Musculoskeletal:        General: Normal range of motion.     Cervical back: Neck supple.  Skin:    General: Skin is warm and dry.     Coloration: Skin is not  jaundiced.     Comments: No abnormal lesions of the skin. Specifically no lesions or burrows of the hands, feet or mucosal membranes. No erythema, flaking or scaling.   Neurological:     Mental Status: She is alert and oriented to person, place, and time.  Psychiatric:        Mood and Affect: Mood normal.        Behavior: Behavior normal.     ED Results / Procedures / Treatments   Labs (all labs ordered are listed, but only abnormal results are displayed) Labs Reviewed - No data to display  EKG None  Radiology No results found.  Procedures Procedures    Medications Ordered in ED Medications - No data to display  ED Course/ Medical Decision Making/ A&P                             Medical Decision Making This patient presents to the ED for concern of pruritus, this involves an extensive number of treatment options, and is a complaint that carries with it a high risk of complications and morbidity.  The differential diagnosis includes delusional parasitosis, scabies, bed bugs, liver dysfunction  Co morbidities that complicate the patient evaluation   anxiety, depression, anemia  Additional history obtained from: Nursing notes from this visit.  Afebrile, hemodynamically stable. 57 year old female presenting to the ED for evaluation of pruritus for the past year. Believes it is caused by scabies. Was treated for scabies 8 months ago by her PCP. She has no other complaints. No skin lesions appreciated on exam of the chest, abdomen, back legs, extremities. Specifically no lesions of the interdigital spaces of the hands or feet, no burrows. Delusional parasitosis is on the differential, however unable to completely rule out scabies. She will be treated again with permethrin cream with repeat application in one week. She was encouraged to follow up with her PCP after repeat application, approximately one week from now. Hyperbilirubinemia is on the differential but considered less  likely due to lack of jaundice or icterus in the setting of one year of symptoms. She was given return precautions. Stable at discharge.  At this time there does not appear to be any evidence of an acute emergency medical condition and the patient appears stable for discharge with appropriate outpatient follow up. Diagnosis was discussed with patient who verbalizes understanding of care plan and is agreeable to discharge. I have discussed return precautions with patient who verbalizes understanding. Patient encouraged to follow-up with their PCP within 1 week. All questions answered.  Note: Portions of this report may have been transcribed  using voice recognition software. Every effort was made to ensure accuracy; however, inadvertent computerized transcription errors may still be present.        Final Clinical Impression(s) / ED Diagnoses Final diagnoses:  Pruritus    Rx / DC Orders ED Discharge Orders          Ordered    permethrin (ELIMITE) 5 % cream        10/24/22 1154              Michelle Piper, PA-C 10/24/22 1155    Loetta Rough, MD 10/24/22 1238

## 2022-10-24 NOTE — Discharge Instructions (Signed)
You have been seen today for your complaint of itching. Your discharge medications include permethrin cream. Apply to the entire body before bed, leave in place for 8 to 14 hours, then shower. Repeat this process one week later. Follow up with: your PCP shortly after the second application of permethrin cream. Please seek immediate medical care if you develop any of the following symptoms: The itching does not go away after several days. You notice redness, warmth, or drainage on the skin where you have scratched. You are unusually thirsty or urinating more than normal. Your skin tingles or feels numb. Your skin or the white parts of your eyes turn yellow (jaundice). You feel weak. You have any of the following: Night sweats. Tiredness (fatigue). Weight loss. Abdominal pain. At this time there does not appear to be the presence of an emergent medical condition, however there is always the potential for conditions to change. Please read and follow the below instructions.  Do not take your medicine if  develop an itchy rash, swelling in your mouth or lips, or difficulty breathing; call 911 and seek immediate emergency medical attention if this occurs.  You may review your lab tests and imaging results in their entirety on your MyChart account.  Please discuss all results of fully with your primary care provider and other specialist at your follow-up visit.  Note: Portions of this text may have been transcribed using voice recognition software. Every effort was made to ensure accuracy; however, inadvertent computerized transcription errors may still be present.

## 2022-12-01 ENCOUNTER — Other Ambulatory Visit: Payer: Self-pay | Admitting: Family Medicine

## 2022-12-01 ENCOUNTER — Ambulatory Visit
Admission: RE | Admit: 2022-12-01 | Discharge: 2022-12-01 | Disposition: A | Discharge status: Home / Self Care | Payer: No Typology Code available for payment source | Source: Ambulatory Visit | Attending: Family Medicine | Admitting: Family Medicine

## 2022-12-01 DIAGNOSIS — Z1231 Encounter for screening mammogram for malignant neoplasm of breast: Secondary | ICD-10-CM

## 2022-12-01 DIAGNOSIS — L299 Pruritus, unspecified: Secondary | ICD-10-CM

## 2022-12-18 ENCOUNTER — Ambulatory Visit: Payer: Medicaid Other

## 2023-01-07 ENCOUNTER — Ambulatory Visit
Admission: RE | Admit: 2023-01-07 | Discharge: 2023-01-07 | Disposition: A | Discharge status: Home / Self Care | Payer: 59 | Source: Ambulatory Visit | Attending: Family Medicine | Admitting: Family Medicine

## 2023-01-07 DIAGNOSIS — Z1231 Encounter for screening mammogram for malignant neoplasm of breast: Secondary | ICD-10-CM

## 2023-01-12 DIAGNOSIS — F458 Other somatoform disorders: Secondary | ICD-10-CM | POA: Diagnosis not present

## 2023-01-12 DIAGNOSIS — F1590 Other stimulant use, unspecified, uncomplicated: Secondary | ICD-10-CM | POA: Diagnosis not present

## 2023-01-13 ENCOUNTER — Telehealth: Payer: Self-pay

## 2023-01-13 NOTE — Telephone Encounter (Signed)
Called and left a VM stating that she has things within or on her skin. Also stated things on her pets as well, but not confirmed due to unable to take to the vet. Unfortunately, it was hard to understand at full detail with her VM. She is requesting an appt and stated that she cannot find anyone to help her.  She is not a patient of [facility] and does not have a referral to be seen. Attempted to call patient back, but no answer and left a VM. We are referral based and we will not be able to advise or schedule without a referral. Will notify her if she returns call.

## 2023-04-22 ENCOUNTER — Telehealth: Payer: Self-pay

## 2023-04-22 NOTE — Telephone Encounter (Signed)
 Pt has left another message and we have returned calls, but only requesting to speak with me. I have noted the previous encounter and she has recently tried to reach back. I attempted to call her again. There is no answer and left a voicemail. If patient is to call back I am more than happy to speak with her.   As prev noted we will need a referral and upon a providers review will we be able to schedule or not.

## 2023-06-05 ENCOUNTER — Telehealth: Payer: Self-pay

## 2023-06-05 NOTE — Telephone Encounter (Signed)
 This pt called req help and an appt regarding issues that has been currently going on with herself and her animals within her home and vehicle. She is not an established pt at [facility]. Before being able to explain our new pt appt policy or our referral process she was highly upset and crying. She continued to explain her situation.   She stated that it started at the previous home she was living at with others and it has followed her to her home and her car. She does currently live alone now, but she keeps finding these black and white specks in the carpets of both car and home. Stated that her dog's continually irritated and red with his ears and skin all over as well, but no one is understanding that it is coming from internally. She did explain this a few times of her home, vehicle, and dog. She stated that her physician will probably not help due to all the tests they have already completed and she did not agree with their recommendation of seeing a mental health professional and told them that she is not crazy.   I proceeded to explain to her of our referral process. That unfortunately with her not being a pt of ours we are unable to advise on this situation and we will need a physician referral. Once that is obtained we will have one of our medical providers review and scheduling will be pending that review. She expressed understanding, but continued. I did place on hold to advise with clinical staff if there is any external resources we could provide for her to reach out to, but she disconnected once they became available.   As stated in previously and other encounters, we will need a referral.

## 2023-07-09 ENCOUNTER — Ambulatory Visit (HOSPITAL_COMMUNITY)
Admission: EM | Admit: 2023-07-09 | Discharge: 2023-07-09 | Disposition: A | Discharge status: Home / Self Care | Attending: Nurse Practitioner | Admitting: Nurse Practitioner

## 2023-07-09 ENCOUNTER — Ambulatory Visit (INDEPENDENT_AMBULATORY_CARE_PROVIDER_SITE_OTHER): Admitting: Internal Medicine

## 2023-07-09 ENCOUNTER — Inpatient Hospital Stay: Admission: RE | Admit: 2023-07-09 | Source: Intra-hospital | Admitting: Psychiatry

## 2023-07-09 ENCOUNTER — Encounter: Payer: Self-pay | Admitting: Internal Medicine

## 2023-07-09 ENCOUNTER — Other Ambulatory Visit: Payer: Self-pay

## 2023-07-09 VITALS — BP 118/76 | HR 87 | Temp 97.6°F | Ht 64.0 in | Wt 127.0 lb

## 2023-07-09 DIAGNOSIS — R442 Other hallucinations: Secondary | ICD-10-CM | POA: Insufficient documentation

## 2023-07-09 DIAGNOSIS — F17210 Nicotine dependence, cigarettes, uncomplicated: Secondary | ICD-10-CM | POA: Insufficient documentation

## 2023-07-09 DIAGNOSIS — R441 Visual hallucinations: Secondary | ICD-10-CM | POA: Insufficient documentation

## 2023-07-09 DIAGNOSIS — F32A Depression, unspecified: Secondary | ICD-10-CM | POA: Insufficient documentation

## 2023-07-09 DIAGNOSIS — R45851 Suicidal ideations: Secondary | ICD-10-CM | POA: Insufficient documentation

## 2023-07-09 DIAGNOSIS — F22 Delusional disorders: Secondary | ICD-10-CM | POA: Diagnosis present

## 2023-07-09 DIAGNOSIS — F1520 Other stimulant dependence, uncomplicated: Secondary | ICD-10-CM | POA: Insufficient documentation

## 2023-07-09 LAB — LIPID PANEL
Cholesterol: 212 mg/dL — ABNORMAL HIGH (ref 0–200)
HDL: 89 mg/dL (ref >40)
LDL Cholesterol: 112 mg/dL — ABNORMAL HIGH (ref 0–99)
Total CHOL/HDL Ratio: 2.4 ratio
Triglycerides: 56 mg/dL (ref <150)
VLDL: 11 mg/dL (ref 0–40)

## 2023-07-09 LAB — CBC WITH DIFFERENTIAL/PLATELET
Abs Immature Granulocytes: 0.02 10*3/uL (ref 0.00–0.07)
Basophils Absolute: 0 10*3/uL (ref 0.0–0.1)
Basophils Relative: 0 %
Eosinophils Absolute: 0.2 10*3/uL (ref 0.0–0.5)
Eosinophils Relative: 3 %
HCT: 42.2 % (ref 36.0–46.0)
Hemoglobin: 13.9 g/dL (ref 12.0–15.0)
Immature Granulocytes: 0 %
Lymphocytes Relative: 46 %
Lymphs Abs: 3.4 10*3/uL (ref 0.7–4.0)
MCH: 29.3 pg (ref 26.0–34.0)
MCHC: 32.9 g/dL (ref 30.0–36.0)
MCV: 89 fL (ref 80.0–100.0)
Monocytes Absolute: 0.5 10*3/uL (ref 0.1–1.0)
Monocytes Relative: 7 %
Neutro Abs: 3.4 10*3/uL (ref 1.7–7.7)
Neutrophils Relative %: 44 %
Platelets: 264 10*3/uL (ref 150–400)
RBC: 4.74 MIL/uL (ref 3.87–5.11)
RDW: 13.9 % (ref 11.5–15.5)
WBC: 7.7 10*3/uL (ref 4.0–10.5)
nRBC: 0 % (ref 0.0–0.2)

## 2023-07-09 LAB — URINALYSIS, ROUTINE W REFLEX MICROSCOPIC
Bilirubin Urine: NEGATIVE
Glucose, UA: NEGATIVE mg/dL
Hgb urine dipstick: NEGATIVE
Ketones, ur: NEGATIVE mg/dL
Leukocytes,Ua: NEGATIVE
Nitrite: NEGATIVE
Protein, ur: NEGATIVE mg/dL
Specific Gravity, Urine: 1.02 (ref 1.005–1.030)
pH: 6.5 (ref 5.0–8.0)

## 2023-07-09 LAB — POCT URINE DRUG SCREEN - MANUAL ENTRY (I-SCREEN)
POC Amphetamine UR: POSITIVE — AB
POC Buprenorphine (BUP): NOT DETECTED
POC Cocaine UR: NOT DETECTED
POC Marijuana UR: NOT DETECTED
POC Methadone UR: NOT DETECTED
POC Methamphetamine UR: POSITIVE — AB
POC Morphine: NOT DETECTED
POC Oxazepam (BZO): NOT DETECTED
POC Oxycodone UR: NOT DETECTED
POC Secobarbital (BAR): NOT DETECTED

## 2023-07-09 LAB — COMPREHENSIVE METABOLIC PANEL WITH GFR
ALT: 17 U/L (ref 0–44)
AST: 18 U/L (ref 15–41)
Albumin: 3.8 g/dL (ref 3.5–5.0)
Alkaline Phosphatase: 56 U/L (ref 38–126)
Anion gap: 11 (ref 5–15)
BUN: 10 mg/dL (ref 6–20)
CO2: 28 mmol/L (ref 22–32)
Calcium: 9.3 mg/dL (ref 8.9–10.3)
Chloride: 101 mmol/L (ref 98–111)
Creatinine, Ser: 0.48 mg/dL (ref 0.44–1.00)
GFR, Estimated: >60 mL/min (ref >60)
Glucose, Bld: 70 mg/dL (ref 70–99)
Potassium: 4 mmol/L (ref 3.5–5.1)
Sodium: 140 mmol/L (ref 135–145)
Total Bilirubin: 0.6 mg/dL (ref 0.0–1.2)
Total Protein: 6.9 g/dL (ref 6.5–8.1)

## 2023-07-09 LAB — TSH: TSH: 0.739 u[IU]/mL (ref 0.350–4.500)

## 2023-07-09 LAB — ETHANOL: Alcohol, Ethyl (B): <10 mg/dL (ref <10)

## 2023-07-09 LAB — POC URINE PREG, ED: Preg Test, Ur: NEGATIVE

## 2023-07-09 MED ORDER — TRAZODONE HCL 50 MG PO TABS: 50.0000 mg | ORAL_TABLET | Freq: Every evening | ORAL | Status: DC | PRN

## 2023-07-09 MED ORDER — OLANZAPINE 10 MG IM SOLR: 5.0000 mg | Freq: Three times a day (TID) | INTRAMUSCULAR | Status: DC | PRN

## 2023-07-09 MED ORDER — OLANZAPINE 5 MG PO TBDP: 2.5000 mg | ORAL_TABLET | Freq: Two times a day (BID) | ORAL | Status: DC

## 2023-07-09 MED ORDER — HYDROXYZINE HCL 25 MG PO TABS: 25.0000 mg | ORAL_TABLET | Freq: Three times a day (TID) | ORAL | Status: DC | PRN

## 2023-07-09 MED ORDER — MAGNESIUM HYDROXIDE 400 MG/5ML PO SUSP: 30.0000 mL | Freq: Every day | ORAL | Status: DC | PRN

## 2023-07-09 MED ORDER — OLANZAPINE 5 MG PO TBDP: 5.0000 mg | ORAL_TABLET | Freq: Three times a day (TID) | ORAL | Status: DC | PRN

## 2023-07-09 MED ORDER — OLANZAPINE 2.5 MG PO TABS
2.5000 mg | ORAL_TABLET | Freq: Two times a day (BID) | ORAL | Status: DC
  Administered 2023-07-09: 2.5 mg via ORAL
  Filled 2023-07-09: qty 1

## 2023-07-09 MED ORDER — ALUM & MAG HYDROXIDE-SIMETH 200-200-20 MG/5ML PO SUSP: 30.0000 mL | ORAL | Status: DC | PRN

## 2023-07-09 MED ORDER — ACETAMINOPHEN 325 MG PO TABS: 650.0000 mg | ORAL_TABLET | Freq: Four times a day (QID) | ORAL | Status: DC | PRN

## 2023-07-09 NOTE — Progress Notes (Addendum)
 Pt is admitted to [facility] due to passive SI with no plan or intent. Pt currently denies and verbally contracts for safety. Pt advised to notify staff when having thoughts of hurting self or others. Pt verbalized understanding. Pt is alert and oriented X3. Pt is ambulatory and is oriented to staff/unit. Pt was cooperative with labs and skin assessment. Pt is restless and fidgety. Pt denies pain and current HI/AVH. Staff will monitor for pt's safety.

## 2023-07-09 NOTE — Progress Notes (Signed)
 Patient: Patricia Farrell  DOB: [birth year] MRN: [id number] PCP: Pcp, No    Chief Complaint  Patient presents with   New Patient (Initial Visit)     Patient Active Problem List   Diagnosis Date Noted   Back pain 10/19/2019   Constipation 09/16/2019   Abdominal pain, epigastric 12/31/2015   Pre-diabetes 03/11/2014   Spinal stenosis of lumbar region 01/12/2013   Normocytic anemia 01/12/2013   Preventative health care 05/17/2012   Tobacco use 05/17/2012   Occupational exposure in workplace 05/17/2012   COPD (chronic obstructive pulmonary disease) (HCC) 04/19/2012   Depression 04/17/2012     Subjective:  RAAHI Farrell is a 58 y.o. F with pMHX as below.presents for delusional parasitosis. She states that she will not see mental health(referred by previous provider). She was reffered by Dr. Windle Guard, last seen 06/04/23.  She states itching states in face ear and haig. Feels something is crawling. She states something is biting in her feet. She states she has  gas. She states she has diarrhea at times. She "feels something onme when it hot". She states she eggs everyone in her house.  -Review records: stool )and P negative x 3 last one on 03/10/23. She states it happened 3.5 years ago  she thinks " I am ahost, something go tin me and it like its airborne:. She thinks her dog is giving her something. Pt is teray eyed. She sees balck pecx.  -no fevers or chills.   Review of Systems  All other systems reviewed and are negative.   Past Medical History:  Diagnosis Date   Acute bronchopneumonia 04/17/2012   Candida infection, esophageal (HCC) 04/30/2012   Possible candidal esophagitis    COPD (chronic obstructive pulmonary disease) (HCC) 04/19/2012   Depression    Endometriosis    GERD (gastroesophageal reflux disease)    Normocytic anemia 01/12/2013   Strain of right trapezius muscle 03/11/2014    Outpatient Medications Prior to Visit  Medication Sig Dispense Refill    albuterol (PROVENTIL HFA) 108 (90 Base) MCG/ACT inhaler Inhale 1-2 puffs into the lungs every 6 (six) hours as needed for wheezing or shortness of breath. 18 g 3   aspirin EC 81 MG tablet Take 81 mg by mouth daily.       budesonide-formoterol (SYMBICORT) 160-4.5 MCG/ACT inhaler Inhale 2 puffs into the lungs daily. 1 Inhaler 12   calcium carbonate (OS-CAL - DOSED IN MG OF ELEMENTAL CALCIUM) 1250 MG tablet Take 1 tablet by mouth daily.       Cyanocobalamin (VITAMIN B-12 CR PO) Take 1 tablet by mouth daily.     fish oil-omega-3 fatty acids 1000 MG capsule Take 2 g by mouth daily.       lactulose (CEPHULAC) 20 g packet Take 1 packet (20 g total) by mouth 3 (three) times daily. 30 each 0   magnesium citrate SOLN Take 148 mLs (0.5 Bottles total) by mouth daily as needed for mild constipation, moderate constipation or severe constipation. 195 mL 3   Melatonin 1 MG TABS Take 1 tablet (1 mg total) by mouth at bedtime. 30 each 1   Multiple Vitamins-Minerals (MULTIVITAMINS THER. W/MINERALS) TABS Take 1 tablet by mouth daily.       nicotine (NICODERM CQ - DOSED IN MG/24 HOURS) 14 mg/24hr patch Place 1 patch (14 mg total) onto the skin daily. 28 patch 0   nystatin (MYCOSTATIN/NYSTOP) powder Apply topically 4 (four) times daily. 15 g 0   permethrin (ELIMITE)  5 % cream Apply to affected area once, leave on skin for 8 to 14 hours then rinse off, reapply in one week and follow the same process 60 g 1   omeprazole (PRILOSEC) 40 MG capsule Take 1 capsule (40 mg total) by mouth daily. IM program 30 capsule 3   sertraline (ZOLOFT) 25 MG tablet Take 1 tablet (25 mg total) by mouth daily. 30 tablet 2   No facility-administered medications prior to visit.     No Known Allergies  Social History   Tobacco Use   Smoking status: Every Day    Current packs/day: 0.00    Average packs/day: 0.5 packs/day for 36.0 years (18.0 ttl pk-yrs)    Types: Cigarettes, E-cigarettes    Start date: 04/13/1976    Last attempt to quit:  04/13/2012    Years since quitting: 11.2   Smokeless tobacco: Never   Tobacco comments:    02/20/2016 "use e-cigarettes off and on"  Substance Use Topics   Alcohol use: Yes    Alcohol/week: 6.0 standard drinks of alcohol    Types: 4 Glasses of wine, 2 Cans of beer per week    Comment: 02/20/2016  Endorses prior heavy use.    Drug use: No    Comment: Patient daughter in room and patient distressed. Will discuss drug use at later evaluation.    Family History  Problem Relation Age of Onset   Diabetes Sister    Diabetes Brother     Objective:   Vitals:   07/09/23 0845  BP: 118/76  Pulse: 87  Temp: 97.6 F (36.4 C)  TempSrc: Oral  SpO2: 98%  Weight: 127 lb (57.6 kg)  Height: 5\' 4"  (1.626 m)   Body mass index is 21.8 kg/m.  Physical Exam Constitutional:      Appearance: Normal appearance.     Comments: Teary eyed  HENT:     Head: Normocephalic and atraumatic.     Right Ear: Tympanic membrane normal.     Left Ear: Tympanic membrane normal.     Nose: Nose normal.     Mouth/Throat:     Mouth: Mucous membranes are moist.  Eyes:     Extraocular Movements: Extraocular movements intact.     Conjunctiva/sclera: Conjunctivae normal.     Pupils: Pupils are equal, round, and reactive to light.  Cardiovascular:     Rate and Rhythm: Normal rate and regular rhythm.     Heart sounds: No murmur heard.    No friction rub. No gallop.  Pulmonary:     Effort: Pulmonary effort is normal.     Breath sounds: Normal breath sounds.  Abdominal:     General: Abdomen is flat.     Palpations: Abdomen is soft.  Musculoskeletal:        General: Normal range of motion.  Skin:    General: Skin is warm and dry.  Neurological:     General: No focal deficit present.     Mental Status: She is alert and oriented to person, place, and time.  Psychiatric:        Mood and Affect: Mood normal.     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 PT was referred to behavioral health by pcp. Presented for 2nd option. Pt states she has parasites in scalp, face and infer ear.  -On exam: TM nl, no face or scalp rash -F/u with psychiatry(pt is agreeable). .No suicidal ideation/plan  Danelle Earthly, MD [facility]   07/09/23  9:00 AM  I have personally spent 65 minutes involved in face-to-face and non-face-to-face activities for this patient on the day of the visit. Professional time spent includes the following activities: Preparing to see the patient (review of tests), Obtaining and/or reviewing separately obtained history (admission/discharge record), Performing a medically appropriate examination and/or evaluation , Ordering medications/tests/procedures, referring and communicating with other health care professionals, Documenting clinical information in the EMR, Independently interpreting results (not separately reported), Communicating results to the patient/family/caregiver, Counseling and educating the patient/family/caregiver and Care coordination (not separately reported).

## 2023-07-09 NOTE — Progress Notes (Signed)
   07/09/23 0947  [facility] Triage Screening (Walk-ins at [facility] only)  What Is the Reason for Your Visit/Call Today? Pt presents to [facility] voluntarily unaccompanied. Pt states she is seeing and feeling bugs come out of her skin, for the past three years. She states she is not delusional, but when her friends came to her house they also saw the bugs. Pt states she was refered her by her doctor. Pt states she needs medicatoin and needs help. "i cant keeping living like this". Pt denies SI, HI, AVH, Abuse, Alcohol Drug use.  How Long Has This Been Causing You Problems? > than 6 months  Have You Recently Had Any Thoughts About Hurting Yourself? No  Are You Planning to Commit Suicide/Harm Yourself At This time? No  Have you Recently Had Thoughts About Hurting Someone Karolee Ohs? No  Are You Planning To Harm Someone At This Time? No  Physical Abuse Denies  Verbal Abuse Denies  Sexual Abuse Denies  Exploitation of patient/patient's resources Denies  Self-Neglect Denies  Are you currently experiencing any auditory, visual or other hallucinations? No  Have You Used Any Alcohol or Drugs in the Past 24 Hours? No  Do you have any current medical co-morbidities that require immediate attention? No  Clinician description of patient physical appearance/behavior: pt is irritatble and crying during triage.  What Do You Feel Would Help You the Most Today? Social Support;Medication(s)  If access to [facility] was not available, would you have sought care in the Emergency Department? No  Determination of Need Routine (7 days)  Options For Referral Medication Management;Outpatient Therapy;Other: Comment

## 2023-07-09 NOTE — Discharge Summary (Signed)
 Patricia Farrell to be D/C'd Home per NP order. Discussed with the patient and all questions fully answered. An After Visit Summary was printed and given to the patient. All belongings returned. Patient escorted out and D/C home via private auto.  Dickie La  07/09/2023 6:07 PM

## 2023-07-09 NOTE — Discharge Instructions (Addendum)
 Patient has been educated of the potential for worsening of her mental health symptoms, presenting with passive suicidal ideations, not receptive to being admitted to inpatient hospitalization at this time, even though she was previously receptive.  Patient reports that she is willing to take the risk, and has signed an AMA form to leave hospital.  Agreeable to taking liability on herself if symptoms worsen.

## 2023-07-09 NOTE — ED Provider Notes (Signed)
 Patient assented to the [place] [facility], orders placed for her to be admitted there, she changed her mind, asking to leave AGAINST MEDICAL ADVICE.  Case discussed with Dr. Loleta Chance.  Patient has been educated of the potential for worsening of her mental health symptoms, presenting with passive suicidal ideations, not receptive to being admitted to inpatient hospitalization at this time, even though she was previously receptive.  Patient reports that she is willing to take the risk, and has signed an AMA form to leave hospital.  Agreeable to taking liability on herself if symptoms worsen.  At time of departure from the urgent care, she denied SI, denies HI, denies any plan or intent to harm herself or others in the community.

## 2023-07-09 NOTE — ED Provider Notes (Signed)
 Behavioral Health Urgent Care Medical Screening Exam  Patient Name: Patricia Farrell MRN: [id number] Date of Evaluation: 07/09/23 Chief Complaint:  Tactile hallucinations  Diagnosis:  Final diagnoses:  Methamphetamine use disorder, severe, dependence (HCC)   History of Present illness: Patricia Farrell is a 58 y.o. Caucasian female who presents to the [facility] behavioral health center with complaints of tactile & visual hallucinations of bugs times the past 3 years. Per triage note:  "Pt states she is seeing and feeling bugs come out of her skin, for the past three years. She states she is not delusional, but when her friends came to her house they also saw the bugs. Pt states she was refered her by her doctor. Pt states she needs medicatoin and needs help. "i cant keeping living like this".   Assessment: During encounter, patient is restless, she has difficulty sitting still.  She is crying hysterically, presents with passive SI, states that she does not want to live like this anymore, states it would okay if she goes to sleep and not wake up, reports being tired of living like this.  Denies having a suicide plan, but reports feeling overwhelmed, reports inability to sleep, states that she is sleeping 1 hour nightly, reports anhedonia, trouble with her concentration, states "nothing makes me happy".  She reports that she has been using methamphetamines now for the past 5 years, began having tactile and visual hallucinations of bugs 3 years ago.  She states:  "they are everywhere. In my washer, in my dryer, in her hair, on my body, on my floors, on my dog. I had another dog pass last October, and I think that dog passed what he had onto me and my other dogs. I feel contagious. I think me and my dog need to be locked up somewhere."  Patient reports that her use of meth has been an effort to get energy, she reports that she purchases $20 worth of methamphetamines and it lasts her a few weeks.   Patient reports that she snorts it, reports that she also smokes cigarettes, specifically half a pack per day, rarely uses alcohol, only socially.  Denies any other substance use.  Patient reports feeling distressed, states that she has pictures of the bugs that she found in her dryer on her phone, but that no one will believe her. She states that she had taken the dog that passed away to the [facility] in an effort for that dog to get treated for the bugs, but the Vet told her that the dog did not have bugs. She reports that she feels lonely at home, has not support system with the exception of one good friend Loraine Leriche), reports that one of her children is incarcerated for stealing her identity, elaborates that he stole her social security number and opened accounts in her name and messed her credit up. She reports that her other child is a daughter who resides in [place], but who is not helpful. Pt denies AH, denies HI.   Patient is agreeable to inpatient treatment and stabilization of mental status. Case discussed with Dr. Loleta Chance who is agreeable with recommendation. Orders entered as follows: -Admit to Obs pending inpatient transfer -Draw baseline labs including TSH, hemoglobin A1c, CMP, CBC, EKG, lipid panel -Agitation protocol: Zyprexa 5 mg p.o. or IM 3 times daily as needed -Zyprexa 2.5 mg twice daily for psychosis  Flowsheet Row ED from 07/09/2023 in [facility] ED from 10/24/2022 in [facility] Emergency Department  at [employer]  C-SSRS RISK CATEGORY No Risk No Risk      Psychiatric Specialty Exam  Presentation  General Appearance:Casual  Eye Contact:Fair  Speech:Clear and Coherent  Speech Volume:Normal  Handedness:Right   Mood and Affect  Mood:No data recorded Affect:Congruent   Thought Process  Thought Processes:Coherent  Descriptions of Associations:Intact  Orientation:Full (Time, Place and Person)  Thought Content:Illogical     Hallucinations:Visual; Tactile seeing and feeling bugs  Ideas of Reference:None  Suicidal Thoughts:No  Homicidal Thoughts:No   Sensorium  Memory:Immediate Fair  Judgment:Fair  Insight:Poor   Executive Functions  Concentration:Poor  Attention Span:Poor  Recall:Poor  Fund of Knowledge:Poor  Language:Fair   Psychomotor Activity  Psychomotor Activity:Normal   Assets  Assets:Resilience   Sleep  Sleep:Poor  Physical Exam: Physical Exam Constitutional:      Appearance: Normal appearance.  Musculoskeletal:        General: Normal range of motion.     Cervical back: Normal range of motion.  Neurological:     General: No focal deficit present.     Mental Status: She is alert and oriented to person, place, and time.    Review of Systems  Psychiatric/Behavioral:  Positive for depression, hallucinations, substance abuse and suicidal ideas. Negative for memory loss. The patient is nervous/anxious and has insomnia.   All other systems reviewed and are negative.  Blood pressure 139/81, pulse 76, temperature 97.6 F (36.4 C), temperature source Oral, resp. rate 18, SpO2 100%. There is no height or weight on file to calculate BMI.  Musculoskeletal: Strength & Muscle Tone: within normal limits Gait & Station: normal Patient leans: N/A   [facility] MSE Discharge Disposition for Follow up and Recommendations: Based on my evaluation I certify that psychiatric inpatient services furnished can reasonably be expected to improve the patient's condition which I recommend transfer to an appropriate accepting facility.   Patient is agreeable to inpatient treatment and stabilization of mental status. Case discussed with Dr. Loleta Chance who is agreeable with recommendation. Orders entered as follows: -Admit to Obs pending inpatient transfer -Draw baseline labs including TSH, hemoglobin A1c, CMP, CBC, EKG, lipid panel -Agitation protocol: Zyprexa 5 mg p.o. or IM 3 times daily as  needed -Zyprexa 2.5 mg twice daily for psychosis  Starleen Blue, NP 07/09/2023, 1:42 PM

## 2023-07-10 LAB — HEMOGLOBIN A1C
Hgb A1c MFr Bld: 5.3 % (ref 4.8–5.6)
Mean Plasma Glucose: 105.41 mg/dL

## 2024-01-18 ENCOUNTER — Other Ambulatory Visit: Payer: Self-pay

## 2024-01-18 ENCOUNTER — Emergency Department (HOSPITAL_BASED_OUTPATIENT_CLINIC_OR_DEPARTMENT_OTHER)
Admission: EM | Admit: 2024-01-18 | Discharge: 2024-01-18 | Disposition: A | Discharge status: Home / Self Care | Attending: Emergency Medicine | Admitting: Emergency Medicine

## 2024-01-18 ENCOUNTER — Encounter (HOSPITAL_BASED_OUTPATIENT_CLINIC_OR_DEPARTMENT_OTHER): Payer: Self-pay | Admitting: Emergency Medicine

## 2024-01-18 DIAGNOSIS — Y92410 Unspecified street and highway as the place of occurrence of the external cause: Secondary | ICD-10-CM | POA: Insufficient documentation

## 2024-01-18 DIAGNOSIS — J449 Chronic obstructive pulmonary disease, unspecified: Secondary | ICD-10-CM | POA: Insufficient documentation

## 2024-01-18 DIAGNOSIS — M25511 Pain in right shoulder: Secondary | ICD-10-CM | POA: Diagnosis present

## 2024-01-18 DIAGNOSIS — Z7982 Long term (current) use of aspirin: Secondary | ICD-10-CM | POA: Diagnosis not present

## 2024-01-18 DIAGNOSIS — M542 Cervicalgia: Secondary | ICD-10-CM | POA: Diagnosis not present

## 2024-01-18 DIAGNOSIS — Z720 Tobacco use: Secondary | ICD-10-CM | POA: Diagnosis not present

## 2024-01-18 MED ORDER — CYCLOBENZAPRINE HCL 10 MG PO TABS: 10.0000 mg | ORAL_TABLET | Freq: Two times a day (BID) | ORAL | 0 refills | Status: AC | PRN

## 2024-01-18 MED ORDER — LIDOCAINE 5 % EX PTCH
1.0000 | MEDICATED_PATCH | CUTANEOUS | Status: DC
Start: 2024-01-18 — End: 2024-01-19
  Administered 2024-01-18: 1 via TRANSDERMAL
  Filled 2024-01-18: qty 1

## 2024-01-18 MED ORDER — LIDOCAINE 5 % EX PTCH: 1.0000 | MEDICATED_PATCH | CUTANEOUS | 0 refills | Status: AC
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
